# Patient Record
Sex: Female | Born: 1960
Health system: Southern US, Community
[De-identification: ages and names within clinical notes are randomized; demographics above are authoritative.]

## PROBLEM LIST (undated history)

## (undated) DIAGNOSIS — M069 Rheumatoid arthritis, unspecified: Secondary | ICD-10-CM

## (undated) DIAGNOSIS — I10 Essential (primary) hypertension: Secondary | ICD-10-CM

## (undated) DIAGNOSIS — Z8619 Personal history of other infectious and parasitic diseases: Secondary | ICD-10-CM

## (undated) HISTORY — DX: Rheumatoid arthritis, unspecified: M06.9

## (undated) HISTORY — DX: Personal history of other infectious and parasitic diseases: Z86.19

## (undated) HISTORY — DX: Essential (primary) hypertension: I10

---

## 2004-04-28 ENCOUNTER — Encounter: Admission: RE | Admit: 2004-04-28 | Discharge: 2004-04-28 | Payer: Self-pay | Admitting: *Deleted

## 2004-07-06 ENCOUNTER — Ambulatory Visit (HOSPITAL_COMMUNITY): Admission: RE | Admit: 2004-07-06 | Discharge: 2004-07-06 | Payer: Self-pay | Admitting: Obstetrics & Gynecology

## 2010-05-03 DIAGNOSIS — I10 Essential (primary) hypertension: Secondary | ICD-10-CM | POA: Insufficient documentation

## 2010-05-03 HISTORY — DX: Essential (primary) hypertension: I10

## 2013-06-27 ENCOUNTER — Emergency Department (INDEPENDENT_AMBULATORY_CARE_PROVIDER_SITE_OTHER)
Admission: EM | Admit: 2013-06-27 | Discharge: 2013-06-27 | Disposition: A | Discharge status: Home / Self Care | Payer: Self-pay | Source: Home / Self Care | Attending: Family Medicine | Admitting: Family Medicine

## 2013-06-27 ENCOUNTER — Telehealth (HOSPITAL_COMMUNITY): Payer: Self-pay | Admitting: Family Medicine

## 2013-06-27 ENCOUNTER — Encounter (HOSPITAL_COMMUNITY): Payer: Self-pay | Admitting: Emergency Medicine

## 2013-06-27 DIAGNOSIS — M79606 Pain in leg, unspecified: Secondary | ICD-10-CM

## 2013-06-27 DIAGNOSIS — M79609 Pain in unspecified limb: Secondary | ICD-10-CM

## 2013-06-27 LAB — BASIC METABOLIC PANEL
BUN: 9 mg/dL (ref 6–23)
CO2: 25 mEq/L (ref 19–32)
Calcium: 9.3 mg/dL (ref 8.4–10.5)
Chloride: 100 mEq/L (ref 96–112)
Creatinine, Ser: 0.49 mg/dL — ABNORMAL LOW (ref 0.50–1.10)
GFR calc Af Amer: >90 mL/min (ref >90)
GFR calc non Af Amer: >90 mL/min (ref >90)
Glucose, Bld: 88 mg/dL (ref 70–99)
Potassium: 4.1 mEq/L (ref 3.7–5.3)
Sodium: 138 mEq/L (ref 137–147)

## 2013-06-27 LAB — SEDIMENTATION RATE: Sed Rate: 62 mm/hr — ABNORMAL HIGH (ref 0–22)

## 2013-06-27 LAB — CK: Total CK: 33 U/L (ref 7–177)

## 2013-06-27 LAB — CBC
HCT: 36 % (ref 36.0–46.0)
Hemoglobin: 12.4 g/dL (ref 12.0–15.0)
MCH: 28.1 pg (ref 26.0–34.0)
MCHC: 34.4 g/dL (ref 30.0–36.0)
MCV: 81.4 fL (ref 78.0–100.0)
Platelets: 359 10*3/uL (ref 150–400)
RBC: 4.42 MIL/uL (ref 3.87–5.11)
RDW: 13.7 % (ref 11.5–15.5)
WBC: 12.8 10*3/uL — ABNORMAL HIGH (ref 4.0–10.5)

## 2013-06-27 MED ORDER — TRAMADOL HCL 50 MG PO TABS: 50.0000 mg | ORAL_TABLET | Freq: Four times a day (QID) | ORAL | Status: DC | PRN

## 2013-06-27 NOTE — Discharge Instructions (Signed)
Thank you for coming in today. Please call [phone] and asked to be transferred to the vascular ultrasound to get your ultrasound appointment changed. Otherwise please show up at 11:45 in the morning on Friday at the [facility] registration for your ultrasound. Use tramadol for severe pain Go to the emergency room if your pain history medically worse Myalgia, Adult Myalgia is the medical term for muscle pain. It is a symptom of many things. Nearly everyone at some time in their life has this. The most common cause for muscle pain is overuse or straining and more so when you are not in shape. Injuries and muscle bruises cause myalgias. Muscle pain without a history of injury can also be caused by a virus. It frequently comes along with the flu. Myalgia not caused by muscle strain can be present in a large number of infectious diseases. Some autoimmune diseases like lupus and fibromyalgia can cause muscle pain. Myalgia may be mild, or severe. SYMPTOMS  The symptoms of myalgia are simply muscle pain. Most of the time this is short lived and the pain goes away without treatment. DIAGNOSIS  Myalgia is diagnosed by your caregiver by taking your history. This means you tell him when the problems began, what they are, and what has been happening. If this has not been a long term problem, your caregiver may want to watch for a while to see what will happen. If it has been long term, they may want to do additional testing. TREATMENT  The treatment depends on what the underlying cause of the muscle pain is. Often anti-inflammatory medications will help. HOME CARE INSTRUCTIONS  If the pain in your muscles came from overuse, slow down your activities until the problems go away.  Myalgia from overuse of a muscle can be treated with alternating hot and cold packs on the muscle affected or with cold for the first couple days. If either heat or cold seems to make things worse, stop their use.  Apply ice to the  sore area for 15-20 minutes, 03-04 times per day, while awake for the first 2 days of muscle soreness, or as directed. Put the ice in a plastic bag and place a towel between the bag of ice and your skin.  Only take over-the-counter or prescription medicines for pain, discomfort, or fever as directed by your caregiver.  Regular gentle exercise may help if you are not active.  Stretching before strenuous exercise can help lower the risk of myalgia. It is normal when beginning an exercise regimen to feel some muscle pain after exercising. Muscles that have not been used frequently will be sore at first. If the pain is extreme, this may mean injury to a muscle. SEEK MEDICAL CARE IF:  You have an increase in muscle pain that is not relieved with medication.  You begin to run a temperature.  You develop nausea and vomiting.  You develop a stiff and painful neck.  You develop a rash.  You develop muscle pain after a tick bite.  You have continued muscle pain while working out even after you are in good condition. SEEK IMMEDIATE MEDICAL CARE IF: Any of your problems are getting worse and medications are not helping. MAKE SURE YOU:   Understand these instructions.  Will watch your condition.  Will get help right away if you are not doing well or get worse. Document Released: 03/11/2006 Document Revised: 07/12/2011 Document Reviewed: 05/31/2006 [facility] Patient Information 2014 [place], [place].

## 2013-06-27 NOTE — ED Notes (Signed)
C/o bilateral leg pain for six months States she has pain, swelling Has tried change blood pressure medication to help with muscle ache Has tried stretching and aleve

## 2013-06-27 NOTE — ED Provider Notes (Signed)
Vicki Cook is a 53 y.o. female who presents to Urgent Care today for bilateral right worse than left leg pain. This is been present for the past 6 months it occurs off and on. It has been worse again for the past week or 2. She denies any fevers or chills nausea vomiting or diarrhea. The pain is worse with prolonged standing and walking. She attributes the pain to blood pressure medications. She currently is on Toprol. She's tried some over-the-counter medications which have not helped. She has been do her regular doctor several times but she has not had a formal workup yet. No radiating pain weakness or numbness   History reviewed. No pertinent past medical history. History  Substance Use Topics  . Smoking status: Not on file  . Smokeless tobacco: Not on file  . Alcohol Use: Not on file   ROS as above Medications: No current facility-administered medications for this encounter.   Current Outpatient Prescriptions  Medication Sig Dispense Refill  . traMADol (ULTRAM) 50 MG tablet Take 1 tablet (50 mg total) by mouth every 6 (six) hours as needed.  15 tablet  0    Exam:  BP 146/83  Pulse 95  Temp(Src) 98.2 F (36.8 C) (Oral)  Resp 15  SpO2 100%  LMP 06/17/2013 Gen: Well NAD HEENT: EOMI,  MMM Lungs: Normal work of breathing. CTABL Heart: RRR no MRG Abd: NABS, Soft. NT, ND Exts: Brisk capillary refill, warm and well perfused.  Bilateral legs are relatively normal appearing with no edema. Bilateral calves are very mildly tender. Normal motion capillary refill and sensation. Pulses are intact bilateral dorsal pedis is slightly reduced bilateral PTs. Capillary refill and sensation are intact distally.   Results for orders placed during the hospital encounter of 06/27/13 (from the past 24 hour(s))  CBC     Status: Abnormal   Collection Time    06/27/13  4:58 PM      Result Value Ref Range   WBC 12.8 (*) 4.0 - 10.5 K/uL   RBC 4.42  3.87 - 5.11 MIL/uL   Hemoglobin 12.4  12.0  - 15.0 g/dL   HCT 36.0  36.0 - 46.0 %   MCV 81.4  78.0 - 100.0 fL   MCH 28.1  26.0 - 34.0 pg   MCHC 34.4  30.0 - 36.0 g/dL   RDW 13.7  11.5 - 15.5 %   Platelets 359  150 - 400 K/uL   No results found.  Assessment and Plan: 53 y.o. female with leg pain. Unclear etiology. This is a chronic issue however I feel that deserves a bit of workup. Work up will include CBC, BMP, CK, ESR. Additionally will obtain ultrasounds of both arteries and venous systems of both legs on Friday. She will present to the emergency room sooner if worsening. Tramadol for pain control.  Discussed warning signs or symptoms. Please see discharge instructions. Patient expresses understanding.    Gregor Hams, MD 06/27/13 [phone]

## 2013-06-27 NOTE — ED Notes (Signed)
Lab results have come back.  Patient has elevated WBC and ESR. I recommended patient go to the emergency room for further evaluation and management.  She states that she would like to wait until a PCP appointment on Friday.  I  reemphasized that this may be a serious issue. She does not want to the emergency room. She expresses understanding and agreement.   Gregor Hams, MD 06/27/13 [phone]

## 2013-06-29 ENCOUNTER — Other Ambulatory Visit (HOSPITAL_COMMUNITY): Payer: Self-pay | Admitting: Family Medicine

## 2013-06-29 ENCOUNTER — Ambulatory Visit (HOSPITAL_COMMUNITY)
Admission: RE | Admit: 2013-06-29 | Discharge: 2013-06-29 | Disposition: A | Discharge status: Home / Self Care | Payer: Self-pay | Source: Ambulatory Visit | Attending: Family Medicine | Admitting: Family Medicine

## 2013-06-29 ENCOUNTER — Ambulatory Visit (HOSPITAL_COMMUNITY): Admit: 2013-06-29 | Discharge: 2013-06-29 | Disposition: A | Discharge status: Home / Self Care | Payer: Self-pay

## 2013-06-29 DIAGNOSIS — M79609 Pain in unspecified limb: Secondary | ICD-10-CM | POA: Insufficient documentation

## 2013-06-29 DIAGNOSIS — M79606 Pain in leg, unspecified: Secondary | ICD-10-CM

## 2013-06-29 NOTE — Progress Notes (Addendum)
VASCULAR LAB PRELIMINARY  PRELIMINARY  PRELIMINARY  PRELIMINARY  Lower extremity venous duplex and ABIs completed.    Preliminary report:  Negative for deep and superficial vein thrombosis bilateral.  Normal ABI,s  Incidental findings of highly vascularized  Structures in the popliteal fossa extending into  Proximal calf.  Etiology unknown.  Jamilette Suchocki, RVT 06/29/2013, 5:15 PM

## 2013-07-02 ENCOUNTER — Telehealth (HOSPITAL_COMMUNITY): Payer: Self-pay | Admitting: Family Medicine

## 2013-07-02 NOTE — ED Notes (Signed)
Abnormal vascular structures BL popliteal fossa.  Unclear etiology.  Discussed with radiology.  Plan for limited extremity ultrasound to evaluate the structure.  [place] will call the patient and schedule.    Rodolph Bong, MD 07/02/13 [phone]

## 2013-07-06 ENCOUNTER — Ambulatory Visit (HOSPITAL_COMMUNITY)
Admission: RE | Admit: 2013-07-06 | Discharge: 2013-07-06 | Disposition: A | Discharge status: Home / Self Care | Payer: Self-pay | Source: Ambulatory Visit | Attending: Family Medicine | Admitting: Family Medicine

## 2013-07-06 DIAGNOSIS — Z1389 Encounter for screening for other disorder: Secondary | ICD-10-CM | POA: Insufficient documentation

## 2013-07-09 NOTE — ED Notes (Signed)
Patient called earlier today, c/o she is out of her medication, and cannot bear the pain any more. C/o she has not heard back from the MD regarding her reports, and what is she to do now ? spoke w Spoke w Dr Carin Primrose after review of record,  authorized refill of ultram 50 mg po, 1 tablet q 6 hours prn pain x 15, NR . Called in to [facility] at pt request, spoke directly w pharmacist. Patient was advised Dr Denyse Amass will have report on his desk for him to review on his return to clinic in AM , but pt should expect to return to clinic for further evaluation if there are other issues , no further refills will be authorized w/o exam

## 2013-07-11 ENCOUNTER — Telehealth (HOSPITAL_COMMUNITY): Payer: Self-pay | Admitting: Family Medicine

## 2013-07-11 NOTE — ED Notes (Signed)
UPDATE: Patient was seen at [facility] for leg pain.  She had a vascular scan which showed no DVT or arterial issues but a structure in the posterior knee.  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  I recommend f/u with the [place] [facility] sports [facility].   Patient expressed understanding and agreement.    Rodolph Bong, MD 07/11/13 [phone]

## 2013-07-11 NOTE — ED Notes (Signed)
Patient called w many questions regarding her wait for new appt w sports medicine. Per Dr Denyse Amass, she needs to ocme in to have an evaluation and poss intervention for pain

## 2013-07-16 ENCOUNTER — Ambulatory Visit (INDEPENDENT_AMBULATORY_CARE_PROVIDER_SITE_OTHER): Payer: Self-pay | Admitting: Family Medicine

## 2013-07-16 ENCOUNTER — Encounter: Payer: Self-pay | Admitting: Family Medicine

## 2013-07-16 VITALS — BP 112/79 | Ht 60.0 in | Wt 153.0 lb

## 2013-07-16 DIAGNOSIS — M7120 Synovial cyst of popliteal space [Baker], unspecified knee: Secondary | ICD-10-CM

## 2013-07-16 MED ORDER — TRAMADOL HCL 50 MG PO TABS: 25.0000 mg | ORAL_TABLET | Freq: Four times a day (QID) | ORAL | Status: DC | PRN

## 2013-07-16 NOTE — Patient Instructions (Signed)
You do have a Baker's cyst but there is a lot of blood flow around it so we do not think it is safe to inject steroids at this time into the area. We need to figure out why you have this excess blood flow by getting an MRI of the area.  We need to get you the orange card to help pay for this MRI.  Once you have the orange card insurance in hand, call this office and we will schedule your MRI.  After you have your MRI, follow up with [place] about a week afterwards.   Thanks, Dr. Durene Cal and Dr. Jennette Kettle

## 2013-07-17 ENCOUNTER — Ambulatory Visit: Payer: Self-pay

## 2013-07-20 DIAGNOSIS — M7120 Synovial cyst of popliteal space [Baker], unspecified knee: Secondary | ICD-10-CM | POA: Insufficient documentation

## 2013-07-20 NOTE — Progress Notes (Signed)
   Subjective:    Patient ID: Vicki Cook, female    DOB: [birth year], 53 y.o.   MRN: [id number]  HPI   Here for evaluation of bilateral but right greater than left knee pain. Pain is primarily located in the posterior portion of the knee. It's been going on 6-8 months. No specific injury. Pain is worse with standing long periods of time. Pain is localized to the knee. Does not radiate. Has had no locking, buckling or falling.  PERTINENT  PMH / PSH: No history of prior knee injury or surgery. Patient has no personal history of diabetes mellitus. Review of Systems Denies numbness in her legs or feet. She's had no erythema warmth or swelling of her knees. Denies fever, sweats, chills, unusual weight change.    Objective:   Physical Exam Vital signs are reviewed GENERAL: Well-developed female no acute distress KNEES: Bilaterally she has full range of motion in flexion and extension. She has some diffuse tenderness in the popliteal space particularly on the right. Both popliteal space areas are full consistent with Baker's cyst. The calves bilaterally are soft. Distally she is intact neurovascularly. There is no tenderness along the medial or lateral joint lines. She is ligamentously intact to varus and valgus stress and has a normal walk on bilaterally. ULTRASOUND: Complex apparent Baker's cyst on the right with an extreme amount of vascularity noted surrounding the cyst. There is some debris floating within the cyst. The right cystic compasses majority of the popliteal space. On the left, the systems much more typical, less complex, there is still an increased amount of vascularity from what I usually see in Baker's cyst the       Assessment & Plan:  #1. Bilateral Baker cysts. These are unusual in my estimation because of the rather large amount of vascularity associated with him. I reviewed her Doppler studies from the vascular lab and they also commented on increased vascularity seen in  the popliteal space. It does not look like popliteal aneurysm.  We discussed this with patient at length. I think the next step we need to do would be an MRI of the right knee. She has no insurance coverage so we're going to try get her into process for sign up for the [facility] card which would at least give her imaging coverage. Until then, I don't think I would aspirate, inject or otherwise do procedure on the knees. Gave her information to get set up with the [facility] card process; she supposed to followup with [place] as soon as possible after that. 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

## 2013-07-23 NOTE — Addendum Note (Signed)
Addended by: Annita Brod on: 07/23/2013 04:58 PM   Modules accepted: Orders

## 2013-07-25 ENCOUNTER — Telehealth: Payer: Self-pay | Admitting: *Deleted

## 2013-07-25 NOTE — Telephone Encounter (Signed)
Pt has orange card now, would like to be scheduled for MRI sooner than 08/08/13.  Called  imaging and rescheduled MRI for 07/27/13 at 6:30 am.  Pt notified of appt change.

## 2013-07-27 ENCOUNTER — Ambulatory Visit
Admission: RE | Admit: 2013-07-27 | Discharge: 2013-07-27 | Disposition: A | Discharge status: Home / Self Care | Payer: No Typology Code available for payment source | Source: Ambulatory Visit | Attending: Family Medicine | Admitting: Family Medicine

## 2013-07-27 DIAGNOSIS — M7120 Synovial cyst of popliteal space [Baker], unspecified knee: Secondary | ICD-10-CM

## 2013-07-30 ENCOUNTER — Encounter: Payer: Self-pay | Admitting: Family Medicine

## 2013-07-30 ENCOUNTER — Ambulatory Visit (INDEPENDENT_AMBULATORY_CARE_PROVIDER_SITE_OTHER): Payer: No Typology Code available for payment source | Admitting: Family Medicine

## 2013-07-30 VITALS — BP 140/99 | HR 85 | Ht 60.0 in | Wt 153.0 lb

## 2013-07-30 DIAGNOSIS — M25562 Pain in left knee: Principal | ICD-10-CM

## 2013-07-30 DIAGNOSIS — M7120 Synovial cyst of popliteal space [Baker], unspecified knee: Secondary | ICD-10-CM

## 2013-07-30 DIAGNOSIS — M25561 Pain in right knee: Secondary | ICD-10-CM

## 2013-07-30 DIAGNOSIS — M25569 Pain in unspecified knee: Secondary | ICD-10-CM

## 2013-07-30 NOTE — Assessment & Plan Note (Signed)
Left knee was injected today. Will plan to see her back in 2-3 weeks to see if she has any benefit from this injection. If she does, it is reasonable to drain and inject the R bakers cyst as well. If she does not have benefit, will likely avoid injecting R knee.   INJECTION: Patient was given informed consent, signed copy in the chart. Appropriate time out was taken. Area prepped and draped in usual sterile fashion. 1 cc of methylprednisolone 40 mg/ml plus  ___ cc of 1% lidocaine without epinephrine was injected into the left knee using a medial approach. The patient tolerated the procedure well. There were no complications. Post procedure instructions were given.

## 2013-07-30 NOTE — Progress Notes (Signed)
   HPI:  F/u MRI results: here to discuss the results of her recent R knee MRI. She endorses significant pain in both of her knees. Today the pain is worse on the L. MRI of R knee showed bakers cyst. Has pain especially with walking.  ROS: See HPI  PMFSH: bakers cyst of R knee  PHYSICAL EXAM: BP 140/99  Pulse 85  Ht 5' (1.524 m)  Wt 153 lb (69.4 kg)  BMI 29.88 kg/m2 Gen: NAD, pleasant, coopeartive MSK: bilateral knees without effusion, erythema, deformity. MCL and LCL intact bilaterally. Full strength with flexion & extension bilaterally.  ASSESSMENT/PLAN:  See problem based charting for assessment/plan.  FOLLOW UP: F/u in 2-3 weeks for knee pain  SIGNED: [place] J. Pollie Meyer, MD Family Medicine Resident PGY-2 [facility]

## 2013-08-01 NOTE — Progress Notes (Signed)
Followup bilateral knee pain. At last office visit the right knee was bothering him more. Today the left knee is bothering her more. She did have MRI of her right knee done. She wants to discuss those findings and options theVital signs are reviewed GENERAL: Well-developed female no acute distress KNEES: Bilaterally she has fullness in the popliteal space, right greater than left. The calf is soft bilaterally. She has full range of flexion extension on both knees. Distally she is neurovascularly intact. IMAGING: MRI reveals a complex Baker's cyst in the right knee.  As today her left knee is hurting worse will do a corticosteroid injection there. I probably will see how she responds to injection therapy before undertaking aspiration injection on the right knee. The right knee would have to be done her ultrasound to avoid devascularized area in the popliteal space.

## 2013-08-01 NOTE — Assessment & Plan Note (Signed)
1. Complex moderate-sized joint effusion and Baker's cyst as  described, the latter likely partially ruptured.  2. Associated medial bursal fluid and MCL degeneration may  contribute to medial knee pain. The medial collateral ligament is  intact.  3. Relatively mild tricompartmental degenerative changes. No acute  osteochondral findings.  4. Intact menisci and cruciate ligaments.

## 2013-08-08 ENCOUNTER — Ambulatory Visit (HOSPITAL_COMMUNITY): Payer: No Typology Code available for payment source

## 2013-08-10 ENCOUNTER — Ambulatory Visit (HOSPITAL_COMMUNITY): Payer: No Typology Code available for payment source

## 2013-08-20 ENCOUNTER — Ambulatory Visit: Payer: No Typology Code available for payment source | Admitting: Family Medicine

## 2013-08-27 ENCOUNTER — Encounter: Payer: Self-pay | Admitting: Family Medicine

## 2013-08-27 ENCOUNTER — Ambulatory Visit (INDEPENDENT_AMBULATORY_CARE_PROVIDER_SITE_OTHER): Payer: No Typology Code available for payment source | Admitting: Family Medicine

## 2013-08-27 VITALS — BP 127/89 | Ht 60.0 in | Wt 153.0 lb

## 2013-08-27 DIAGNOSIS — M25561 Pain in right knee: Secondary | ICD-10-CM

## 2013-08-27 DIAGNOSIS — M25562 Pain in left knee: Secondary | ICD-10-CM

## 2013-08-27 DIAGNOSIS — M25569 Pain in unspecified knee: Secondary | ICD-10-CM

## 2013-08-27 DIAGNOSIS — M7120 Synovial cyst of popliteal space [Baker], unspecified knee: Secondary | ICD-10-CM

## 2013-08-27 MED ORDER — METHYLPREDNISOLONE ACETATE 40 MG/ML IJ SUSP
40.0000 mg | Freq: Once | INTRAMUSCULAR | Status: DC
  Administered 2013-08-27: 40 mg via INTRA_ARTICULAR

## 2013-08-27 MED ORDER — METHYLPREDNISOLONE ACETATE 40 MG/ML IJ SUSP
40.0000 mg | Freq: Once | INTRAMUSCULAR | Status: AC
  Administered 2013-08-27: 40 mg via INTRA_ARTICULAR

## 2013-08-27 NOTE — Assessment & Plan Note (Addendum)
Ultrasound-guided aspiration with injection 1 cc of corticosteroid today. Into the right knee.

## 2013-08-27 NOTE — Progress Notes (Signed)
   Subjective:    Patient ID: Vicki Cook, female    DOB: 1960/09/15, 53 y.o.   MRN: [id number]  HPI Followup left knee pain. I have given her a corticosteroid injection last office visit she is about 90% better with the left knee. Still has occasional swelling if she works 12 hour shifts but usually by the next day the swelling is gone. Pain significantly better. At the end of the day she can take 2 Tylenol and rest well with respect to the left knee. The right knee continues to be problematic. He had an MRI that which showed large Baker's cyst. He upset revealed a lot of excess vascularity. She's back today for evaluation of possible aspiration the Baker's cyst, possible injection of the knee. She's here with her son.   Review of Systems No fever, sweats, chills, unusual weight change.    Objective:   Physical Exam Vital signs are reviewed GENERAL: Well-developed female no acute distress KNEE: Left. Full range of motion in flexion extension. No effusion. Very slight medial joint line tenderness to palpation. KNEE: Right. Very full popliteal space which is mildly tender. There is no erythema or warmth. No significant knee effusion. She has a lot of crepitus. Medial joint line tenderness to palpation.  PROCEDURE NOTE: Patient given informed consent, signed copy in the chart. Appropriate time out was taken. The right popliteal space was cleaned and prepped in usual sterile fashion. The ultrasound was used as a guide for local anesthesia with 4 cc of 1% lidocaine without epinephrine. After anesthesia was obtained, the ultrasound was used to guide the 18-gauge needle on a 60 cc syringe into the Baker's cyst avoiding any major blood vessels. The tip of the needle could easily be seen inside the cyst and suction was obtained but minimal material was aspirated. A lot of debris in the cyst could be seen to move. with aspiration so I'm pretty sure we were in the right place. Only 2 cc of clear viscous  material were removed. The 60 cc syringe was then removed using sterile hemostats and replaced with a 3 cc syringe holding 1 cc of lidocaine 1% without epinephrine and 1 cc of methylprednisolone 40 mg per ml. This was injected into the Baker's cyst. All equipment was removed and a Band-Aid applied. Patient to our procedure well. Patient given red flags to watch out for such as signs of infection, it significant increase in pain, swelling, erythema, fever.       Assessment & Plan:

## 2013-11-06 ENCOUNTER — Ambulatory Visit (INDEPENDENT_AMBULATORY_CARE_PROVIDER_SITE_OTHER): Payer: No Typology Code available for payment source | Admitting: Family Medicine

## 2013-11-06 ENCOUNTER — Encounter: Payer: Self-pay | Admitting: Family Medicine

## 2013-11-06 VITALS — BP 144/93 | HR 87 | Ht 60.0 in | Wt 153.0 lb

## 2013-11-06 DIAGNOSIS — M25562 Pain in left knee: Secondary | ICD-10-CM

## 2013-11-06 DIAGNOSIS — M25569 Pain in unspecified knee: Secondary | ICD-10-CM

## 2013-11-06 MED ORDER — METHYLPREDNISOLONE ACETATE 40 MG/ML IJ SUSP
40.0000 mg | Freq: Once | INTRAMUSCULAR | Status: AC
  Administered 2013-11-06: 40 mg via INTRA_ARTICULAR

## 2013-11-06 NOTE — Assessment & Plan Note (Signed)
posterior and consistent with known large baker's cyst.  Has not had an MRI on this knee but I suspect it would be nearly identical to right knee MRI.  No DVT.  I do not think it's worthwhile to try aspiration and injection of this knee - would likely be similar to that tried on right knee and only elicit 1-2 mL of cystic fluid, not worth the risk.  She did have benefit with injection - advised we repeat this today into left knee under ultrasound guidance.  Icing, compression, elevation.  Consider referral to ortho surgery though difficult with [facility] coverage.  After informed written consent, patient was lying supine on exam table. Left knee was prepped with alcohol swab and utilizing superolateral approach under ultrasound guidance, patient's left knee was injected intraarticularly with 3:1 marcaine: depomedrol. Patient tolerated the procedure well without immediate complications.

## 2013-11-06 NOTE — Progress Notes (Signed)
Patient ID: TYERRA LORETTO, female   DOB: [birth year], 53 y.o.   MRN: [id number]  PCP: No PCP Per Patient  Subjective:   HPI: Patient is a 53 y.o. female here for left knee pain.  Patient has been seen previously in [place] office for issues with bilateral leg and knee swelling. She's had this for over 8 months now. No known injury. Causes pain, swelling, tightness in backs of knees into calf muscles. 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  No meniscal tears, other pathology of note. She had intraarticular injection of left knee which helped but was painful for first 2 weeks - pain has worsened over past month. Tried to have right bakers cyst aspirated and injection - only 1-2 mL thick cystic fluid obtained before injection. Left one is worse than right currently. Only has [facility] coverage at the moment. Tried ibuprofen 600 three times a day. Worse when on her feet or walking a lot.  Past Medical History  Diagnosis Date  . Hypertension     Current Outpatient Prescriptions on File Prior to Visit  Medication Sig Dispense Refill  . traMADol (ULTRAM) 50 MG tablet Take 0.5-1 tablets (25-50 mg total) by mouth every 6 (six) hours as needed.  30 tablet  0   No current facility-administered medications on file prior to visit.    History reviewed. No pertinent past surgical history.  Allergies  Allergen Reactions  . Asa [Aspirin]     History   Social History  . Marital Status: Married    Spouse Name: N/A    Number of Children: N/A  . Years of Education: N/A   Occupational History  . Not on file.   Social History Main Topics  . Smoking status: Never Smoker   . Smokeless tobacco: Not on file  . Alcohol Use: Not on file  . Drug Use: Not on file  . Sexual Activity: Not on file   Other Topics Concern  . Not on file   Social History Narrative  . No  narrative on file    No family history on file.  BP 144/93  Pulse 87  Ht 5' (1.524 m)  Wt 153 lb (69.4 kg)  BMI 29.88 kg/m2  Review of Systems: See HPI above.    Objective:  Physical Exam:  Gen: NAD  Left knee: Mod effusion and moderate sized bakers cyst posterior knee.  No bruising, other deformity. No TTP currently. ROM 0 - 120 degrees without pain. Negative ant/post drawers. Negative valgus/varus testing. Negative lachmanns. Negative mcmurrays, apleys, patellar apprehension, clarkes. NV intact distally.    Assessment & Plan:  1. Left knee pain - posterior and consistent with known large baker's cyst.  Has not had an MRI on this knee but I suspect it would be nearly identical to right knee MRI.  No DVT.  I do not think it's worthwhile to try aspiration and injection of this knee - would likely be similar to that tried on right knee and only elicit 1-2 mL of cystic fluid, not worth the risk.  She did have benefit with injection - advised we repeat this today into left knee under ultrasound guidance.  Icing, compression, elevation.  Consider referral to ortho surgery though difficult with [facility] coverage.  After informed written consent, patient was lying supine on exam table. Left knee was prepped with alcohol swab and utilizing  superolateral approach under ultrasound guidance, patient's left knee was injected intraarticularly with 3:1 marcaine: depomedrol. Patient tolerated the procedure well without immediate complications.

## 2013-11-06 NOTE — Patient Instructions (Signed)
You do not have very many options that you haven't tried for your baker's cysts. We gave you an injection into the left knee today - these are helpful but will likely not help permanently. Icing 15 minutes at a time. Elevating above the level of your heart. ACE wrap or compression sleeve will help with pain and swelling Ibuprofen or aleve for pain and inflammation. Consider seeing a Careers adviser.

## 2014-01-14 ENCOUNTER — Ambulatory Visit: Payer: No Typology Code available for payment source

## 2014-01-31 ENCOUNTER — Ambulatory Visit: Payer: Self-pay | Admitting: Internal Medicine

## 2014-02-07 ENCOUNTER — Ambulatory Visit (INDEPENDENT_AMBULATORY_CARE_PROVIDER_SITE_OTHER): Payer: Self-pay | Admitting: Internal Medicine

## 2014-02-07 ENCOUNTER — Encounter: Payer: Self-pay | Admitting: Internal Medicine

## 2014-02-07 VITALS — BP 138/96 | HR 67 | Temp 98.2°F | Wt 155.3 lb

## 2014-02-07 DIAGNOSIS — I10 Essential (primary) hypertension: Secondary | ICD-10-CM

## 2014-02-07 DIAGNOSIS — M25531 Pain in right wrist: Secondary | ICD-10-CM

## 2014-02-07 DIAGNOSIS — M659 Synovitis and tenosynovitis, unspecified: Secondary | ICD-10-CM

## 2014-02-07 DIAGNOSIS — M778 Other enthesopathies, not elsewhere classified: Secondary | ICD-10-CM | POA: Insufficient documentation

## 2014-02-07 DIAGNOSIS — IMO0002 Reserved for concepts with insufficient information to code with codable children: Secondary | ICD-10-CM

## 2014-02-07 DIAGNOSIS — B80 Enterobiasis: Secondary | ICD-10-CM

## 2014-02-07 MED ORDER — ALBENDAZOLE 200 MG PO TABS
400.0000 mg | ORAL_TABLET | Freq: Once | ORAL | Status: DC
Start: 2014-02-07 — End: 2014-04-23

## 2014-02-07 NOTE — Assessment & Plan Note (Signed)
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 The patient has no localizing neurological defects on exam and no swelling. Apparently the patient has had a history of some swelling in some joints and there was concern at some point for inflammatory arthritis the patient brings in paperwork that showed labs needed for RF factor and sedimentation rate. Per chart review the patient has had an elevated sedimentation rate but in the setting of current parasitic infection this seems unconnected. -RF factor and sedimentation rate collected - continue physical therapy and ibuprofen for elbow for symptomatic relief, Ace bandage wraps were also recommended when doing hard work for prevention of reinjury -Followup as needed

## 2014-02-07 NOTE — Patient Instructions (Signed)
General Instructions: For your itching we will give you medicine for the parasite. Take one pill today and then 1 pill in 2 weeks.   For your blood pressure we want you to come back in 2 weeks to recheck and then make some changes.   We are doing some labs today to test your kidney and liver and check for diabetes we will call you with the results.   Thank you for bringing your medicines today. This helps [place] keep you safe from mistakes.   Progress Toward Treatment Goals:  No flowsheet data found.  Self Care Goals & Plans:  No flowsheet data found.  No flowsheet data found.   Care Management & Community Referrals:  No flowsheet data found.

## 2014-02-07 NOTE — Assessment & Plan Note (Signed)
BP Readings from Last 3 Encounters:  02/07/14 138/96  11/06/13 144/93  08/27/13 127/89    Lab Results  Component Value Date   NA 138 06/27/2013   K 4.1 06/27/2013   CREATININE 0.49* 06/27/2013    Assessment: Blood pressure control:   Progress toward BP goal:    Comments: Patient was apparently seen at some point at urgent care centers and was given prescription of metoprolol for at least 6-12 months that she has been taking as prescribed.  Plan: Medications:  Patient is currently taking Lopressor 12.5 mg twice a day it is unclear as to why this was the first choice and not a diuretic. At followup visit patient was recommended to start either HCTZ or lisinopril once risk stratification labs are obtained. Educational resources provided:   Self management tools provided:   Other plans: Patient will return in 2 weeks to discuss results and blood pressure recheck. Today CMet, hemoglobin A1c, lipid panel was collected

## 2014-02-07 NOTE — Assessment & Plan Note (Signed)
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 The patient does not have any systemic symptoms such as abdominal pain or nausea vomiting or diarrhea. This is most likely the case as patient reports having previous infection back in her country where she required medications. Given her financial situation may be difficult for the patient to afford first-line therapy. -Albendazole 400 mg x1 and then repeat in 2 weeks -As the patient is unable to afford this Will prescribe pyrantel pamoate -Patient was advised on good hygiene and that other inhabits at the home can become infected if they come in contact with infected sheets or clothing.

## 2014-02-07 NOTE — Progress Notes (Signed)
Subjective:   Patient ID: Vicki Cook female   DOB: 1960-10-19 53 y.o.   MRN: [id number]  HPI: Vicki Cook is a 53 y.o. woman pmh as listed below here as a new pt establishing care with HTN.   Hypertension ROS: taking medications as instructed, no medication side effects noted, patient does not perform home BP monitoring, no TIA's, no chest pain on exertion, no dyspnea on exertion, no swelling of ankles, no orthostatic dizziness or lightheadedness and no palpitations.   The patient recently moved from [place] back in 1999 where she studied long performing her husband and moving to [place]. 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 Her husband was an incarcerated and has been in general for the past 8 years. 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 This is recently become complicated as she has bilateral leg joint pain and popliteal synovial cyst requiring surgery as recommended by sports medicine. Her new complaint today is some right arm pain and soreness mostly in her antecubital region. She has not had any numbness or weakness, shooting pain, joint swelling, or trauma to the area. She does state that at some point she has had some joint swelling mostly in her right wrist that are sometimes accompanied by rashes and she had seen in immigration physician had recommended her get some testing for inflammatory arthritis but she was never able to complete these.  Her other complaint and main concern is some perianal itching that is worse at night. She states that back in July because she had several episodes of these and remembers having to take the medication that removed the itching. She has not seen any worms or rashes and not had any visualization of this area by anyone else. She does not have any abdominal pain, nausea vomiting or diarrhea, hematochezia or  frank red blood per rectum. She has not tried anything over-the-counter and has not had any medication from [place]. She has been routinely changing her sheets and cleaning all of her close and showering daily. She did shower before this visit.  She's a nonsmoker, does no illicit drugs, and uses no alcohol. She has no sexual partners and is not sexually active at this time. Her last Pap smear was 15 years ago that she reports as normal. She does continue to have normal periods. Her mother passed away from heart troubles she does say that she had blood pressure issues. Her father still living and has diabetes and hypertension. Her 2 sons have good health.   Past Medical History  Diagnosis Date  . Hypertension    Current Outpatient Prescriptions  Medication Sig Dispense Refill  . glucosamine-chondroitin 500-400 MG tablet Take 1 tablet by mouth 3 (three) times daily.      . metoprolol tartrate (LOPRESSOR) 12.5 mg TABS tablet Take 25 mg by mouth 2 (two) times daily.      . traMADol (ULTRAM) 50 MG tablet Take 0.5-1 tablets (25-50 mg total) by mouth every 6 (six) hours as needed.  30 tablet  0   No current facility-administered medications for this visit.   No family history on file. History   Social History  . Marital Status: Married    Spouse Name: N/A    Number of Children: N/A  . Years of Education: N/A   Social History Main Topics  . Smoking status:  Never Smoker   . Smokeless tobacco: Not on file  . Alcohol Use: Not on file  . Drug Use: Not on file  . Sexual Activity: Not on file   Other Topics Concern  . Not on file   Social History Narrative  . No narrative on file   Review of Systems: otherwise negative unless listed in HPI  Objective:  Physical Exam: Filed Vitals:   02/07/14 0919  BP: 138/96  Pulse: 67  Temp: 98.2 F (36.8 C)  TempSrc: Oral  Weight: 155 lb 4.8 oz (70.444 kg)  SpO2: 100%   General: sitting in chair, NAD  HEENT: PERRL, EOMI, no scleral icterus,  no LAD Cardiac: RRR, no rubs, murmurs or gallops Pulm: clear to auscultation bilaterally, no crackles, wheezes, or rhonchi, moving normal volumes of air Abd: soft, nontender, nondistended, BS present Ext: warm and well perfused, no pedal edema. No PIP, DIPs, MP joint swelling, no rashes, bilateral knees are wrapped in Ace bandages patient reluctant to remove to complete exam. GU: rectal exam w/o erythema or rashes, no hemorrhoids, no surrounding stool, no other lesions, scothch tape test performed Neuro: alert and oriented X3, cranial nerves II-XII grossly intact, DTRs 2+ bilaterally, 5 out of 5 strength bilaterally both upper extremity and lower extremity, full range of motion of elbow and wrist, tandem gait and coordination normal, normal gait.  Assessment & Plan:  Please see problem oriented charting  Pt discussed with Dr. Lynnae January

## 2014-02-08 LAB — RHEUMATOID FACTOR: Rheumatoid fact SerPl-aCnc: 46 IU/mL — ABNORMAL HIGH (ref <=14)

## 2014-02-08 LAB — COMPREHENSIVE METABOLIC PANEL
ALT: 15 U/L (ref 0–35)
AST: 15 U/L (ref 0–37)
Albumin: 3.5 g/dL (ref 3.5–5.2)
Alkaline Phosphatase: 79 U/L (ref 39–117)
BUN: 10 mg/dL (ref 6–23)
CO2: 28 mEq/L (ref 19–32)
Calcium: 9.4 mg/dL (ref 8.4–10.5)
Chloride: 102 mEq/L (ref 96–112)
Creat: 0.5 mg/dL (ref 0.50–1.10)
Glucose, Bld: 87 mg/dL (ref 70–99)
Potassium: 4.1 mEq/L (ref 3.5–5.3)
Sodium: 138 mEq/L (ref 135–145)
Total Bilirubin: 0.3 mg/dL (ref 0.2–1.2)
Total Protein: 7.3 g/dL (ref 6.0–8.3)

## 2014-02-08 LAB — CBC WITH DIFFERENTIAL/PLATELET
Basophils Absolute: 0 10*3/uL (ref 0.0–0.1)
Basophils Relative: 0 % (ref 0–1)
Eosinophils Absolute: 0.2 10*3/uL (ref 0.0–0.7)
Eosinophils Relative: 2 % (ref 0–5)
HCT: 34.1 % — ABNORMAL LOW (ref 36.0–46.0)
Hemoglobin: 11.1 g/dL — ABNORMAL LOW (ref 12.0–15.0)
Lymphocytes Relative: 16 % (ref 12–46)
Lymphs Abs: 1.9 10*3/uL (ref 0.7–4.0)
MCH: 26.1 pg (ref 26.0–34.0)
MCHC: 32.6 g/dL (ref 30.0–36.0)
MCV: 80.2 fL (ref 78.0–100.0)
Monocytes Absolute: 0.8 10*3/uL (ref 0.1–1.0)
Monocytes Relative: 7 % (ref 3–12)
Neutro Abs: 8.9 10*3/uL — ABNORMAL HIGH (ref 1.7–7.7)
Neutrophils Relative %: 75 % (ref 43–77)
Platelets: 380 10*3/uL (ref 150–400)
RBC: 4.25 MIL/uL (ref 3.87–5.11)
RDW: 15 % (ref 11.5–15.5)
WBC: 11.8 10*3/uL — ABNORMAL HIGH (ref 4.0–10.5)

## 2014-02-08 LAB — LIPID PANEL
Cholesterol: 122 mg/dL (ref 0–200)
HDL: 40 mg/dL (ref >39)
LDL Cholesterol: 57 mg/dL (ref 0–99)
Total CHOL/HDL Ratio: 3.1 Ratio
Triglycerides: 124 mg/dL (ref <150)
VLDL: 25 mg/dL (ref 0–40)

## 2014-02-08 LAB — HEMOGLOBIN A1C
Hgb A1c MFr Bld: 6.2 % — ABNORMAL HIGH (ref <5.7)
Mean Plasma Glucose: 131 mg/dL — ABNORMAL HIGH (ref <117)

## 2014-02-08 LAB — SEDIMENTATION RATE: Sed Rate: 55 mm/hr — ABNORMAL HIGH (ref 0–22)

## 2014-02-11 ENCOUNTER — Telehealth: Payer: Self-pay | Admitting: *Deleted

## 2014-02-11 NOTE — Telephone Encounter (Signed)
Call from pt - Albenza too expensive; $472.81 for 2 tablets w/o insurance. Can u order something else? Thanks

## 2014-02-11 NOTE — Progress Notes (Signed)
Internal Medicine Clinic Attending  Case discussed with Dr. Sadek soon after the resident saw the patient.  We reviewed the resident's history and exam and pertinent patient test results.  I agree with the assessment, diagnosis, and plan of care documented in the resident's note. 

## 2014-02-13 MED ORDER — PYRANTEL PAMOATE 180 MG PO TABS: 180.0000 mg | ORAL_TABLET | Freq: Once | ORAL | Status: DC

## 2014-02-13 NOTE — Telephone Encounter (Signed)
Pt called/informed of new medication.

## 2014-02-13 NOTE — Telephone Encounter (Signed)
Have placed a new order for another medication should be less expensive and available at pharmacy. Please let patient know. Greatly appreciate it.

## 2014-02-25 ENCOUNTER — Ambulatory Visit (INDEPENDENT_AMBULATORY_CARE_PROVIDER_SITE_OTHER): Payer: Self-pay | Admitting: Internal Medicine

## 2014-02-25 ENCOUNTER — Encounter: Payer: Self-pay | Admitting: Internal Medicine

## 2014-02-25 VITALS — BP 140/90 | HR 70 | Temp 97.9°F | Ht 60.0 in | Wt 155.4 lb

## 2014-02-25 DIAGNOSIS — Z0000 Encounter for general adult medical examination without abnormal findings: Secondary | ICD-10-CM | POA: Insufficient documentation

## 2014-02-25 DIAGNOSIS — M7120 Synovial cyst of popliteal space [Baker], unspecified knee: Secondary | ICD-10-CM

## 2014-02-25 DIAGNOSIS — Z23 Encounter for immunization: Secondary | ICD-10-CM

## 2014-02-25 DIAGNOSIS — I10 Essential (primary) hypertension: Secondary | ICD-10-CM

## 2014-02-25 MED ORDER — LISINOPRIL 10 MG PO TABS: 10.0000 mg | ORAL_TABLET | Freq: Every day | ORAL | Status: DC

## 2014-02-25 NOTE — Progress Notes (Signed)
   Subjective:   Patient ID: Vicki Cook female   DOB: 1960/05/20 53 y.o.   MRN: [id number]  HPI: [name] is a 53 y.o. female with HTN presenting to opc today for BP follow up.   HTN--remains hypertensive today, initial BP 150/90, recheck 150/80, on manual repeat 140/90. She is taking lopressor 25mg  bid. Mild headache today. Has been on HCTZ before but not tolerated well.   Of note, she endorses being stressed lately with her husband being incarcerated and she is running the household with limited resources and time.   Past Medical History  Diagnosis Date  . Hypertension    Current Outpatient Prescriptions  Medication Sig Dispense Refill  . albendazole (ALBENZA) 200 MG tablet Take 2 tablets (400 mg total) by mouth once. In 2 weeks on 02/21/14 take the other 2 pills  4 tablet  0  . glucosamine-chondroitin 500-400 MG tablet Take 1 tablet by mouth 3 (three) times daily.      02/23/14 lisinopril (PRINIVIL,ZESTRIL) 10 MG tablet Take 1 tablet (10 mg total) by mouth daily.  30 tablet  1  . Pyrantel Pamoate 180 MG TABS Take 1 tablet (180 mg total) by mouth once.  1 tablet  0  . traMADol (ULTRAM) 50 MG tablet Take 0.5-1 tablets (25-50 mg total) by mouth every 6 (six) hours as needed.  30 tablet  0   No current facility-administered medications for this visit.   Family History  Problem Relation Age of Onset  . Heart disease Mother   . Hyperlipidemia Mother   . Hypertension Mother   . Heart disease Father   . Hypertension Father   . Diabetes Father    History   Social History  . Marital Status: Married    Spouse Name: N/A    Number of Children: N/A  . Years of Education: N/A   Social History Main Topics  . Smoking status: Never Smoker   . Smokeless tobacco: None  . Alcohol Use: No  . Drug Use: No  . Sexual Activity: Not Currently   Other Topics Concern  . None   Social History Narrative  . None   Review of Systems:  Constitutional:  Denies fever, chills  HEENT:   Denies congestion  Respiratory:  Denies SOB  Cardiovascular:  Denies chest pain  Gastrointestinal:  Denies nausea, vomiting, abdominal pain  Genitourinary:  Denies dysuria  Musculoskeletal:  Lower extremity pain  Skin:  Denies pallor, rash and wound.   Neurological:  Mild headache today   Objective:  Physical Exam: Filed Vitals:   02/25/14 1627 02/25/14 1657  BP: 150/90 140/90  Pulse: 70   Temp: 97.9 F (36.6 C)   TempSrc: Oral   Height: 5' (1.524 m)   Weight: 155 lb 6.4 oz (70.489 kg)   SpO2: 100%    Vitals reviewed. General: sitting in chair, NAD HEENT: EOMI Cardiac: RRR Pulm: clear to auscultation bilaterally, no wheezes, rales, or rhonchi Abd: soft, nontender, BS present Ext: moving all 4 extremities, -edema or tenderness to palpation but wearing very tight jeans Neuro: alert and oriented X3  Assessment & Plan:  Discussed with Dr. 02/27/14 D/c lopressor, start ACEi

## 2014-02-25 NOTE — Patient Instructions (Signed)
General Instructions:  Please bring your medicines with you each time you come to clinic.  Medicines may include prescription medications, over-the-counter medications, herbal remedies, eye drops, vitamins, or other pills.  Stop metoprolol, and START lisinopril 10mg  daily, return in 2 weeks for recheck and medication adjustment if needed  Progress Toward Treatment Goals:  Treatment Goal 02/25/2014  Blood pressure deteriorated    Self Care Goals & Plans:  Self Care Goal 02/25/2014  Manage my medications bring my medications to every visit; refill my medications on time; take my medicines as prescribed    No flowsheet data found.   Care Management & Community Referrals:  No flowsheet data found.    Lisinopril tablets What is this medicine? LISINOPRIL (lyse [place] pril) is an ACE inhibitor. This medicine is used to treat high blood pressure and heart failure. It is also used to protect the heart immediately after a heart attack. This medicine may be used for other purposes; ask your health care provider or pharmacist if you have questions. COMMON BRAND NAME(S): Prinivil, Zestril What should I tell my health care provider before I take this medicine? They need to know if you have any of these conditions: -diabetes -heart or blood vessel disease -immune system disease like lupus or scleroderma -kidney disease -low blood pressure -previous swelling of the tongue, face, or lips with difficulty breathing, difficulty swallowing, hoarseness, or tightening of the throat -an unusual or allergic reaction to lisinopril, other ACE inhibitors, insect venom, foods, dyes, or preservatives -pregnant or trying to get pregnant -breast-feeding How should I use this medicine? Take this medicine by mouth with a glass of water. Follow the directions on your prescription label. You may take this medicine with or without food. Take your medicine at regular intervals. Do not stop taking this medicine  except on the advice of your doctor or health care professional. Talk to your pediatrician regarding the use of this medicine in children. Special care may be needed. While this drug may be prescribed for children as young as 17 years of age for selected conditions, precautions do apply. Overdosage: If you think you have taken too much of this medicine contact a poison control center or emergency room at once. NOTE: This medicine is only for you. Do not share this medicine with others. What if I miss a dose? If you miss a dose, take it as soon as you can. If it is almost time for your next dose, take only that dose. Do not take double or extra doses. What may interact with this medicine? -diuretics -lithium -NSAIDs, medicines for pain and inflammation, like ibuprofen or naproxen -over-the-counter herbal supplements like hawthorn -potassium salts or potassium supplements -salt substitutes This list may not describe all possible interactions. Give your health care provider a list of all the medicines, herbs, non-prescription drugs, or dietary supplements you use. Also tell them if you smoke, drink alcohol, or use illegal drugs. Some items may interact with your medicine. What should I watch for while using this medicine? Visit your doctor or health care professional for regular check ups. Check your blood pressure as directed. Ask your doctor what your blood pressure should be, and when you should contact him or her. Call your doctor or health care professional if you notice an irregular or fast heart beat. Women should inform their doctor if they wish to become pregnant or think they might be pregnant. There is a potential for serious side effects to an unborn child. Talk to  your health care professional or pharmacist for more information. Check with your doctor or health care professional if you get an attack of severe diarrhea, nausea and vomiting, or if you sweat a lot. The loss of too much body  fluid can make it dangerous for you to take this medicine. You may get drowsy or dizzy. Do not drive, use machinery, or do anything that needs mental alertness until you know how this drug affects you. Do not stand or sit up quickly, especially if you are an older patient. This reduces the risk of dizzy or fainting spells. Alcohol can make you more drowsy and dizzy. Avoid alcoholic drinks. Avoid salt substitutes unless you are told otherwise by your doctor or health care professional. Do not treat yourself for coughs, colds, or pain while you are taking this medicine without asking your doctor or health care professional for advice. Some ingredients may increase your blood pressure. What side effects may I notice from receiving this medicine? Side effects that you should report to your doctor or health care professional as soon as possible: -abdominal pain with or without nausea or vomiting -allergic reactions like skin rash or hives, swelling of the hands, feet, face, lips, throat, or tongue -dark urine -difficulty breathing -dizzy, lightheaded or fainting spell -fever or sore throat -irregular heart beat, chest pain -pain or difficulty passing urine -redness, blistering, peeling or loosening of the skin, including inside the mouth -unusually weak -yellowing of the eyes or skin Side effects that usually do not require medical attention (report to your doctor or health care professional if they continue or are bothersome): -change in taste -cough -decreased sexual function or desire -headache -sun sensitivity -tiredness This list may not describe all possible side effects. Call your doctor for medical advice about side effects. You may report side effects to FDA at 1-800-FDA-1088. Where should I keep my medicine? Keep out of the reach of children. Store at room temperature between 15 and 30 degrees C (59 and 86 degrees F). Protect from moisture. Keep container tightly closed. Throw away  any unused medicine after the expiration date. NOTE: This sheet is a summary. It may not cover all possible information. If you have questions about this medicine, talk to your doctor, pharmacist, or health care provider.  2015, Elsevier/Gold Standard. (2007-10-23 17:36:32)

## 2014-02-25 NOTE — Assessment & Plan Note (Addendum)
Briefly mentioned lower extremity pain that is chronic while she was leaving. Mentions that she sees sports medicine and has known hx of bakers cyst. Apparently has been recommended for surgery but she refuses at this time given that her husband is in prison and she is alone supporting her family. She was wearing very tight jeans today and so I did not get to examine her full legs but no major tenderness to palpation on physical exam over the jeans today. However, looking through notes, I would recommend her to return to sports medicine if the pain is getting worse as soon as possible or back to our office for further evaluation and dedicated visit. She also REALLY needs an orange card as without that we are limited in referral and other testing she may need. Although MRI and ultrasounds have been done in the past.   -I will forward this note to her pcp to see if she may have any updates on this situation and what we could possibly do for her.  -maybe social work can assist as well, will forward to ms grady as well.

## 2014-02-25 NOTE — Assessment & Plan Note (Signed)
Flu vaccine today 

## 2014-02-25 NOTE — Assessment & Plan Note (Signed)
BP Readings from Last 3 Encounters:  02/25/14 150/80  02/07/14 138/96  11/06/13 144/93   Lab Results  Component Value Date   NA 138 02/07/2014   K 4.1 02/07/2014   CREATININE 0.50 02/07/2014    Assessment: Blood pressure control: mildly elevated Progress toward BP goal:  deteriorated Comments: improved to 140/90 on manual check  Plan: Medications:  looking at Dr. Caleen Jobs last note and agree that BB would not be first choice. given that her BP was elevated again today I think she may be able to be managed on mono-therapy. she claims she has been on HCTZ in the past but it hurt her legs so she does not want to try that. therefore, we will stop BB today and start ACEi and see how she does.  Lisinopril 10mg  for now, titrate up accordingly.  Other plans: follow up 2 weeks. Finances very limited and no orange card at this time, thus ideally would love to be able to keep her controlled with monotherapy but if need to add dual therapy, please try to keep on $4 list

## 2014-02-26 ENCOUNTER — Telehealth: Payer: Self-pay | Admitting: Licensed Clinical Social Worker

## 2014-02-26 NOTE — Progress Notes (Signed)
Case discussed with Dr. Qureshi soon after the resident saw the patient.  We reviewed the resident's history and exam and pertinent patient test results.  I agree with the assessment, diagnosis and plan of care documented in the resident's note. 

## 2014-02-26 NOTE — Telephone Encounter (Signed)
[name] was referred to CSW as pt in need of community resources.  CSW placed call to patient to discuss current needs.  Pt is current with her [facility] Orange card but was unaware about [employer] and [employer].  [name] barriers at this time is citizenship.  Pt is unable to receives resources because of the lack of documentation; however [name] is linked with [employer].  This is the best resources for patient at this time.  [name] voiced concern over the need for medication Albenza.  This medication does not have a PAP at this time and GoodRx still remains too expensive.  CSW reviewed pt's chart and it was noted of change to more affordable medication.  CSW inquired with [name] if she was able to pick up alternate prescription.  Pt states "yes, but I don't think it's working".  Pt did not mention this during her appointment on 02/25/14 stating it was not the same doctor that originally prescribed the medications.  CSW informed [name] will forward to triage RN.

## 2014-02-28 NOTE — Telephone Encounter (Signed)
I will defer this to Dr. Burtis Junes who saw her for the medication and may wish to change her treatment. I only saw her for blood pressure. However, if Dr. Burtis Junes wishes for her to be seen in opc she can request that but she may want to take care of this by phone. I will add Dr. Burtis Junes to this notice and also cc this to PCP who may be able to address the issue as well. I am away from [facility] until Monday but please discuss with them in the meantime.   Thanks,  Dr. Jannet Mantis

## 2014-03-01 NOTE — Telephone Encounter (Signed)
Pt should make an appt to be seen if still having concerns and undergo evaluation.

## 2014-03-05 ENCOUNTER — Telehealth: Payer: Self-pay | Admitting: *Deleted

## 2014-03-05 NOTE — Telephone Encounter (Signed)
Vicki Cook wanted triage nurse to call pt - taking med Albenza not working. Needs an appt in clinic. Message left on home phone recording to call clinic for a FU appt and can call clinic if any questions. Vicki Kidney [name] RN 03/05/14 3:10PM

## 2014-03-05 NOTE — Progress Notes (Signed)
Inquiring if Triage was able to follow up with this patient regarding her concern.  Was taking medication (substitute for Albenza) and stated she did not think it was working.

## 2014-03-18 ENCOUNTER — Encounter: Payer: Self-pay | Admitting: Family Medicine

## 2014-03-18 ENCOUNTER — Ambulatory Visit (INDEPENDENT_AMBULATORY_CARE_PROVIDER_SITE_OTHER): Payer: Self-pay | Admitting: Family Medicine

## 2014-03-18 VITALS — BP 144/93 | HR 78 | Ht 60.0 in | Wt 152.0 lb

## 2014-03-18 DIAGNOSIS — M25562 Pain in left knee: Secondary | ICD-10-CM

## 2014-03-18 MED ORDER — METHYLPREDNISOLONE ACETATE 40 MG/ML IJ SUSP
40.0000 mg | Freq: Once | INTRAMUSCULAR | Status: AC
  Administered 2014-03-18: 40 mg via INTRA_ARTICULAR

## 2014-03-18 NOTE — Telephone Encounter (Signed)
Please schedule asap per dr Burtis Junes and [place] Organ

## 2014-03-18 NOTE — Patient Instructions (Signed)
If you're not improving over the next month call me and we will try an injection in the cyst in the back. Continue with wrapping your knee. Icing as needed.

## 2014-03-20 DIAGNOSIS — M25561 Pain in right knee: Secondary | ICD-10-CM | POA: Insufficient documentation

## 2014-03-20 DIAGNOSIS — M25562 Pain in left knee: Secondary | ICD-10-CM

## 2014-03-20 HISTORY — DX: Pain in right knee: M25.561

## 2014-03-20 NOTE — Assessment & Plan Note (Signed)
primarily posterior and consistent with known large baker's cyst.  Has not had an MRI on this knee but I suspect it would be nearly identical to right knee MRI.  No DVT.  Improved some with intraarticular injection last visit - repeated this today.  If not improving could consider ultrasound guided injection directly into the cyst.  Icing, compression, elevation.  Consider referral to ortho surgery in the future as well if continues to have difficulty with this.   After informed written consent, patient was lying supine on exam table. Left knee was prepped with alcohol swab and utilizing superolateral approach under ultrasound guidance, patient's left knee was injected intraarticularly with 3:1 marcaine: depomedrol. Patient tolerated the procedure well without immediate complications.

## 2014-03-20 NOTE — Progress Notes (Signed)
Patient ID: Vicki Cook, female   DOB: [birth year], 53 y.o.   MRN: [id number]  PCP: Gara Kroner, MD  Subjective:   HPI: Patient is a 53 y.o. female here for left knee pain.  11/06/13: Patient has been seen previously in [place] office for issues with bilateral leg and knee swelling. She's had this for over 8 months now. No known injury. Causes pain, swelling, tightness in backs of knees into calf muscles. 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  No meniscal tears, other pathology of note. She had intraarticular injection of left knee which helped but was painful for first 2 weeks - pain has worsened over past month. Tried to have right bakers cyst aspirated and injection - only 1-2 mL thick cystic fluid obtained before injection. Left one is worse than right currently. Only has cone coverage at the moment. Tried ibuprofen 600 three times a day. Worse when on her feet or walking a lot.  03/18/14: Patient reports she improved some following last injection into left knee. Pain currently 4/10 and radiates to back of knee. Bothers her every day. Worse with prolonged sitting then going to get up. Has been wrapping with ACE bandage which has helped. No catching, locking, giving out.  Past Medical History  Diagnosis Date  . Hypertension     Current Outpatient Prescriptions on File Prior to Visit  Medication Sig Dispense Refill  . albendazole (ALBENZA) 200 MG tablet Take 2 tablets (400 mg total) by mouth once. In 2 weeks on 02/21/14 take the other 2 pills 4 tablet 0  . glucosamine-chondroitin 500-400 MG tablet Take 1 tablet by mouth 3 (three) times daily.    Marland Kitchen lisinopril (PRINIVIL,ZESTRIL) 10 MG tablet Take 1 tablet (10 mg total) by mouth daily. 30 tablet 1  . Pyrantel Pamoate 180 MG TABS Take 1 tablet (180 mg total) by mouth once. 1 tablet 0  . traMADol  (ULTRAM) 50 MG tablet Take 0.5-1 tablets (25-50 mg total) by mouth every 6 (six) hours as needed. 30 tablet 0   No current facility-administered medications on file prior to visit.    No past surgical history on file.  Allergies  Allergen Reactions  . Asa [Aspirin]     History   Social History  . Marital Status: Married    Spouse Name: N/A    Number of Children: N/A  . Years of Education: N/A   Occupational History  . Not on file.   Social History Main Topics  . Smoking status: Never Smoker   . Smokeless tobacco: Not on file  . Alcohol Use: No  . Drug Use: No  . Sexual Activity: Not Currently   Other Topics Concern  . Not on file   Social History Narrative    Family History  Problem Relation Age of Onset  . Heart disease Mother   . Hyperlipidemia Mother   . Hypertension Mother   . Heart disease Father   . Hypertension Father   . Diabetes Father     BP 144/93 mmHg  Pulse 78  Ht 5' (1.524 m)  Wt 152 lb (68.947 kg)  BMI 29.69 kg/m2  LMP 02/22/2014  Review of Systems: See HPI above.    Objective:  Physical Exam:  Gen: NAD  Left knee: Minimal effusion; moderate sized bakers cyst posterior knee.  No bruising, other deformity. No TTP currently. ROM 0 -  120 degrees without pain. Negative ant/post drawers. Negative valgus/varus testing. Negative lachmanns. Negative mcmurrays, apleys, patellar apprehension, clarkes. NV intact distally.    Assessment & Plan:  1. Left knee pain - primarily posterior and consistent with known large baker's cyst.  Has not had an MRI on this knee but I suspect it would be nearly identical to right knee MRI.  No DVT.  Improved some with intraarticular injection last visit - repeated this today.  If not improving could consider ultrasound guided injection directly into the cyst.  Icing, compression, elevation.  Consider referral to ortho surgery in the future as well if continues to have difficulty with this.   After informed  written consent, patient was lying supine on exam table. Left knee was prepped with alcohol swab and utilizing superolateral approach under ultrasound guidance, patient's left knee was injected intraarticularly with 3:1 marcaine: depomedrol. Patient tolerated the procedure well without immediate complications.

## 2014-04-23 ENCOUNTER — Encounter: Payer: Self-pay | Admitting: Internal Medicine

## 2014-04-23 ENCOUNTER — Ambulatory Visit (INDEPENDENT_AMBULATORY_CARE_PROVIDER_SITE_OTHER): Payer: Self-pay | Admitting: Internal Medicine

## 2014-04-23 VITALS — BP 126/77 | HR 87 | Temp 98.2°F | Ht 60.0 in | Wt 151.4 lb

## 2014-04-23 DIAGNOSIS — L282 Other prurigo: Secondary | ICD-10-CM

## 2014-04-23 DIAGNOSIS — B80 Enterobiasis: Secondary | ICD-10-CM

## 2014-04-23 DIAGNOSIS — I10 Essential (primary) hypertension: Secondary | ICD-10-CM

## 2014-04-23 MED ORDER — ALBENDAZOLE 200 MG PO TABS: 400.0000 mg | ORAL_TABLET | Freq: Once | ORAL | Status: DC

## 2014-04-23 NOTE — Patient Instructions (Addendum)
Please go to the [place] outpatient pharmacy to pick up the albendazole medication. Take 400mg  today and then 2 weeks later take another 400mg . Continue to wash all linens and hands and try to prevent re-infestation  i have referred you to dermatology today, they will call you for an appointment.  You may continue the hydrocortisone cream  Return to see pcp in 1-2 weeks, or sooner if the rash is getting worse or not improving  Try to keep skin moisturized to prevent dryness if that does not irritate the skin, nonscented lotions are better  Albendazole tablets What is this medicine? ALBENDAZOLE (al BEN da zole) is an antiparasitic. It is used to treat infections of tapeworms or other parasites. This medicine may be used for other purposes; ask your health care provider or pharmacist if you have questions. COMMON BRAND NAME(S): Albenza What should I tell my health care provider before I take this medicine? They need to know if you have any of these conditions: -anemia -biliary tract blockage -immune system problems -liver disease -an unusual or allergic reaction to albendazole, other medicines, foods, dyes, or preservatives -pregnant or trying to get pregnant -breast-feeding How should I use this medicine? Take this medicine by mouth with a glass of water. Follow the directions on the prescription label. Take this medicine with food. You can crush or chew this medicine. Take all of your medicine as directed even if you think your are better. Do not skip doses or stop your medicine early. Talk to your pediatrician regarding the use of this medicine in children. While this drug may be prescribed for children as young as 46 years of age for selected conditions, precautions do apply. Overdosage: If you think you have taken too much of this medicine contact a poison control center or emergency room at once. NOTE: This medicine is only for you. Do not share this medicine with others. What if I  miss a dose? If you miss a dose, take it as soon as you can. If it is almost time for your next dose, take only that dose. Do not take double or extra doses. What may interact with this medicine? -cimetidine -dexamethasone -praziquantel -theophylline This list may not describe all possible interactions. Give your health care provider a list of all the medicines, herbs, non-prescription drugs, or dietary supplements you use. Also tell them if you smoke, drink alcohol, or use illegal drugs. Some items may interact with your medicine. What should I watch for while using this medicine? Visit your doctor or health care professional as directed. Tell your doctor if your symptoms do not improve or if you get new symptoms.You will need to have blood work done every 2 weeks while you are taking this medicine. Do not get pregnant while taking this drug and for 1 month after your treatment. Talk to your doctor about effective birth control methods. Tell your doctor if you think you may be pregnant. You may get drowsy or dizzy. Do not drive, use machinery, or do anything that needs mental alertness until you know how this medicine affects you. Do not stand or sit up quickly, especially if you are an older patient. This reduces the risk of dizzy or fainting spells. What side effects may I notice from receiving this medicine? Side effects that you should report to your doctor or health care professional as soon as possible: -allergic reactions like skin rash, itching or hives, swelling of the face, lips, or tongue -changes in vision -diarrhea -  difficulty breathing -fast, irregular heartbeat -fever, chills, sore throat -pain, difficulty passing urine -redness, blistering, peeling or loosening of the skin, including the inside the mouth -seizures -unusual aches, pains -unusual bleeding, bruising -unusually weak or tired -yellowing of eyes, skin Side effects that usually do not require medical attention  (report to your doctor or health care professional if they continue or are bothersome): -dizziness -hair loss -headache -nausea, vomiting -stomach pain This list may not describe all possible side effects. Call your doctor for medical advice about side effects. You may report side effects to FDA at 1-800-FDA-1088. Where should I keep my medicine? Keep out of the reach of children. Store at room temperature between 20 and 25 degrees C (68 and 77 degrees F). Keep container tightly closed. Throw away any unused medicine after the expiration date. NOTE: This sheet is a summary. It may not cover all possible information. If you have questions about this medicine, talk to your doctor, pharmacist, or health care provider.  2015, Elsevier/Gold Standard. (2007-07-13 10:52:21)

## 2014-04-24 ENCOUNTER — Encounter: Payer: Self-pay | Admitting: Internal Medicine

## 2014-04-24 DIAGNOSIS — L282 Other prurigo: Secondary | ICD-10-CM | POA: Insufficient documentation

## 2014-04-24 NOTE — Assessment & Plan Note (Addendum)
Recurrent, worse in heat or with sweat. Darkened pruritic patches on lateral surfaces of neck with lichenified patch of skin on right lateral surface of neck as well. Also noted to have dry grayish skin on inside of elbows. ?eczema vs. Possible drug reaction to lisinopril? However, drug reaction to lisinopril seems less likely given that she was having the itching complaints before starting the medication and has had it before as well. She also endorses scattered joint pain and occasional dry mouth. Unclear if symptoms are all related to rash. Mild improvement in symptoms with hydrocortisone 2.5% cream that she is applying to affected areas that was prescribed by outside physician.   Patient seen and evaluated with Dr. Daryll Drown. Will hold off on stopping ACEi for now and continue to monitor and will also place a dermatology referral. In the meantime, continue supportive treatment with hydrocortisone cream. If rash worsens or does not improve, she is asked to let us know right away or if severe go directly to the emergency room. She voices understanding. It does not seem these symptoms are related to her pinworm infection, however, will monitor to see if improvement with treatment of parasite.  She did mention on the phone today that joint pain has resolved since taking the medication after her appointment.   -follow up in 1 week, if rash persists or worsening, consider d/c ACEi  -cbc, cmet, esr, crp ordered--she left clinic in a hurry and will need to return for lab work with follow up appointment in 1 week--preferably with pcp, discussed with front desk who will schedule appointment -dermatology referral placed -continue supportive measures with hydrocortisone cream for now and keep skin moisturized with non-scented lotion to try to prevent dry skin

## 2014-04-24 NOTE — Assessment & Plan Note (Addendum)
Continues to be symptomatic, with anal itching that is very bothersome. Could not afford albendazole that was initially prescribed in October by Dr. Burtis Junes and tried pyrantel pamoate with no improvement in symptoms x1 dose. She is requesting albendazole if it can be affordable as she believes she took that in he past and is the only thing that helps. She has been following good hygiene at home to try to prevent re-infection but says it never improved after one dose of the pyrantel pamoate in October and does not wish to retry that at this time.  She does not recall noticing any worms in her stool.   -I discussed with Dr. Ninetta Lights on the phone to see what other options we may have who directed me to the [facility] who may have some affordable option for albendazole. The pharmacy was able to secure the medicine for patient for $9 which she will pick up and is relieved to get.  -albendazole 400mg  x1, then repeat dose 2 weeks later; material on medication provided including possible side effects. she is not sexually active, denies possibility of being pregnant, last sexual encounter has been years ago. She is advised to return to clinic right away if she notices any worsening in rash or other side effects or if severe to go directly to emergency room -she was also supposed to have labs done, however, she had to leave the clinic in a hurry to get to the pharmacy and thus was not able to able to stay for rectal examination and drop by the lab.  She will need to return in 1 weeks time for lab work and re-evaluation. She may need scotch tape test again on follow up to ensure medication has been effective and eggs/worm no longer present -I educated her again on proper hygiene, washing hands frequently, bathing, nail cutting, and washing all linens and clothes to try to prevent re-infection which is common.  I also recommended treatment of household, but says she does not believe her kids her infected, they  have no complaints and they usually are not in the same space as her and she is careful with hygiene. She says she did give them one dose of the pyrantel pamoate a few months ago just in case.   I did speak to her on the phone today 12/23--she was able to pick up the medication and took a dose last night. Reports improvement in symptoms already with less itching and no joint pain today. She is very thankful for her visit and being able to get the medication and will follow up in 1 week.

## 2014-04-24 NOTE — Progress Notes (Signed)
Subjective:   Patient ID: Vicki Cook female   DOB: 1960-11-07 53 y.o.   MRN: [id number]  HPI: [name] is a 53 y.o. female with pmh as listed below prsenting to opc today for acute visit.   Pinworm-- reports could not afford albendazole when seen in October by Dr. Burtis Junes.  Her symptoms she says have not improved, still present, with anal itching that is very uncomfortable.  She did try the pyrantel pamoate in october which is over the counter but says that made no difference and she only took one dose and is requesting albendazole today if it can be affordable, because nothing else works.  She has had similar infection in the past and believes she took albendazole which treated the issue. She has been practicing good hygiene at home, washing all linens and handss. Does not believe children are infected.   Rash--dry itchy skin around neck and inside elbows. On going several months, worse when it is hot and she is sweating. Endorses patch of dry hardened itchy skin on her neck that has been present prior to starting lisinopril. Also endorses scattered joint pain and dry mouth possibly after starting ACEi, but cannot provide clear timeline. Mild relief and improvement with topical hydrocortisone cream 2% prescribed by outside provider. She says she has had this in the past but recently is noticing it again, maybe due to the heat being on at home. Denies fever, chills or abdominal pain.   Past Medical History  Diagnosis Date  . Hypertension    Current Outpatient Prescriptions  Medication Sig Dispense Refill  . albendazole (ALBENZA) 200 MG tablet Take 2 tablets (400 mg total) by mouth once. In 2 weeks on 05/07/13 take the other 2 pills (400mg ) 8 tablet 0  . glucosamine-chondroitin 500-400 MG tablet Take 1 tablet by mouth 3 (three) times daily.    lisinopril (PRINIVIL,ZESTRIL) 10 MG tablet Take 1 tablet (10 mg total) by mouth daily. 30 tablet 1  . traMADol (ULTRAM) 50 MG tablet Take  0.5-1 tablets (25-50 mg total) by mouth every 6 (six) hours as needed. 30 tablet 0   No current facility-administered medications for this visit.   Family History  Problem Relation Age of Onset  . Heart disease Mother   . Hyperlipidemia Mother   . Hypertension Mother   . Heart disease Father   . Hypertension Father   . Diabetes Father    History   Social History  . Marital Status: Married    Spouse Name: N/A    Number of Children: N/A  . Years of Education: N/A   Social History Main Topics  . Smoking status: Never Smoker   . Smokeless tobacco: None  . Alcohol Use: No  . Drug Use: No  . Sexual Activity: Not Currently   Other Topics Concern  . None   Social History Narrative   Review of Systems:  Constitutional:  Denies fever, chills  HEENT:  Dry mouth at times  Respiratory:  Denies SOB  Gastrointestinal:  Denies nausea, vomiting, abdominal pain.   Genitourinary:  Denies dysuria   Musculoskeletal:  Diffuse joint pain   Skin:  Dry itchy skin, mainly on neck and inside of arms   Rectal:  Anal itching    Objective:  Physical Exam: Filed Vitals:   04/23/14 1459  BP: 126/77  Pulse: 87  Temp: 98.2 F (36.8 C)  TempSrc: Oral  Height: 5' (1.524 m)  Weight: 151 lb 6.4 oz (68.675 kg)  SpO2: 100%   Vitals reviewed. General: sitting in chair, NAD HEENT: EOMI Cardiac: RRR Pulm: clear to auscultation bilaterally Abd: soft, BS present Ext: moving all extremities Neuro: alert and oriented X3 Skin: darkened areas on lateral portions of neck with patch of hardened dry skin on right side, pruritic, dry skin. Patches of gray dry skin inside surface of elbows. Visible excoriation marks where she has been scratching top of back and neck region. No obvious rash on abdomen, back, and lower extremities Assessment & Plan:  Discussed with Dr. Criselda Peaches Rash--?eczema, itching and dry skin around neck and inner arms; derm referral If worsening may need to stop acei? Applying  hydrocortisone cream for now Pinworm-- able to get albendazole approved from pharmacy

## 2014-04-24 NOTE — Assessment & Plan Note (Signed)
BP Readings from Last 3 Encounters:  04/23/14 126/77  03/18/14 144/93  02/25/14 140/90   Lab Results  Component Value Date   NA 138 02/07/2014   K 4.1 02/07/2014   CREATININE 0.50 02/07/2014   Assessment: Blood pressure control: controlled Progress toward BP goal:  at goal Comments: adherent to lisinopril  Plan: Medications:  continue current medications lisinopril 10mg  daily Educational resources provided:   Self management tools provided:   Other plans: monitor closely for possible worsening of rash or side-effects, may need to be discontinued. Follow up in one week

## 2014-04-24 NOTE — Progress Notes (Signed)
Internal Medicine Clinic Attending  I saw and evaluated the patient.  I personally confirmed the key portions of the history and exam documented by Dr. Qureshi and I reviewed pertinent patient test results.  The assessment, diagnosis, and plan were formulated together and I agree with the documentation in the resident's note. 

## 2014-05-01 ENCOUNTER — Ambulatory Visit (INDEPENDENT_AMBULATORY_CARE_PROVIDER_SITE_OTHER): Payer: Self-pay | Admitting: Internal Medicine

## 2014-05-01 ENCOUNTER — Encounter: Payer: Self-pay | Admitting: Internal Medicine

## 2014-05-01 VITALS — BP 148/77 | HR 84 | Temp 98.6°F | Ht 60.0 in | Wt 151.1 lb

## 2014-05-01 DIAGNOSIS — I10 Essential (primary) hypertension: Secondary | ICD-10-CM

## 2014-05-01 DIAGNOSIS — B80 Enterobiasis: Secondary | ICD-10-CM

## 2014-05-01 DIAGNOSIS — D509 Iron deficiency anemia, unspecified: Secondary | ICD-10-CM

## 2014-05-01 DIAGNOSIS — D72829 Elevated white blood cell count, unspecified: Secondary | ICD-10-CM

## 2014-05-01 DIAGNOSIS — L282 Other prurigo: Secondary | ICD-10-CM

## 2014-05-01 LAB — COMPLETE METABOLIC PANEL WITH GFR
ALBUMIN: 3.5 g/dL (ref 3.5–5.2)
ALK PHOS: 92 U/L (ref 39–117)
ALT: 14 U/L (ref 0–35)
AST: 14 U/L (ref 0–37)
BUN: 11 mg/dL (ref 6–23)
CALCIUM: 9.2 mg/dL (ref 8.4–10.5)
CHLORIDE: 102 meq/L (ref 96–112)
CO2: 28 meq/L (ref 19–32)
CREATININE: 0.5 mg/dL (ref 0.50–1.10)
Glucose, Bld: 77 mg/dL (ref 70–99)
Potassium: 4.1 mEq/L (ref 3.5–5.3)
Sodium: 137 mEq/L (ref 135–145)
Total Bilirubin: 0.3 mg/dL (ref 0.2–1.2)
Total Protein: 7.8 g/dL (ref 6.0–8.3)

## 2014-05-01 LAB — CBC WITH DIFFERENTIAL/PLATELET
Basophils Absolute: 0 10*3/uL (ref 0.0–0.1)
Basophils Relative: 0 % (ref 0–1)
Eosinophils Absolute: 0.2 10*3/uL (ref 0.0–0.7)
Eosinophils Relative: 2 % (ref 0–5)
HEMATOCRIT: 33.7 % — AB (ref 36.0–46.0)
Hemoglobin: 11.5 g/dL — ABNORMAL LOW (ref 12.0–15.0)
LYMPHS PCT: 14 % (ref 12–46)
Lymphs Abs: 1.6 10*3/uL (ref 0.7–4.0)
MCH: 25.8 pg — ABNORMAL LOW (ref 26.0–34.0)
MCHC: 34.1 g/dL (ref 30.0–36.0)
MCV: 75.7 fL — ABNORMAL LOW (ref 78.0–100.0)
MONO ABS: 0.7 10*3/uL (ref 0.1–1.0)
MPV: 8.4 fL — ABNORMAL LOW (ref 8.6–12.4)
Monocytes Relative: 6 % (ref 3–12)
Neutro Abs: 8.8 10*3/uL — ABNORMAL HIGH (ref 1.7–7.7)
Neutrophils Relative %: 78 % — ABNORMAL HIGH (ref 43–77)
Platelets: 438 10*3/uL — ABNORMAL HIGH (ref 150–400)
RBC: 4.45 MIL/uL (ref 3.87–5.11)
RDW: 14.7 % (ref 11.5–15.5)
WBC: 11.3 10*3/uL — ABNORMAL HIGH (ref 4.0–10.5)

## 2014-05-01 LAB — C-REACTIVE PROTEIN: CRP: 3.4 mg/dL — ABNORMAL HIGH (ref <0.60)

## 2014-05-01 MED ORDER — AMLODIPINE BESYLATE 5 MG PO TABS: 5.0000 mg | ORAL_TABLET | Freq: Every day | ORAL | Status: DC

## 2014-05-01 NOTE — Patient Instructions (Addendum)
General Instructions: -Stop taking lisinopril.  -Start taking amlodipine 5mg  daily.  -Take Albenza next week on 05/07/13. -Call 07/05/13 with update on your blood pressure. A good blood pressure range is between 100/70 to 140/90.  -Follow up with Dr. [place] in 3 months or sooner as needed.    Happy New Year!     Please bring your medicines with you each time you come to clinic.  Medicines may include prescription medications, over-the-counter medications, herbal remedies, eye drops, vitamins, or other pills.   Progress Toward Treatment Goals:  Treatment Goal 04/23/2014  Blood pressure at goal    Self Care Goals & Plans:  Self Care Goal 05/01/2014  Manage my medications take my medicines as prescribed; bring my medications to every visit; refill my medications on time  Eat healthy foods drink diet soda or water instead of juice or soda; eat more vegetables; eat foods that are low in salt; eat baked foods instead of fried foods; eat fruit for snacks and desserts    No flowsheet data found.   Care Management & Community Referrals:  No flowsheet data found.

## 2014-05-02 DIAGNOSIS — D72829 Elevated white blood cell count, unspecified: Secondary | ICD-10-CM | POA: Insufficient documentation

## 2014-05-02 DIAGNOSIS — D509 Iron deficiency anemia, unspecified: Secondary | ICD-10-CM | POA: Insufficient documentation

## 2014-05-02 LAB — SEDIMENTATION RATE: Sed Rate: 89 mm/hr — ABNORMAL HIGH (ref 0–22)

## 2014-05-02 NOTE — Progress Notes (Signed)
   Subjective:    Patient ID: Vicki Cook, female    DOB: 10-23-1960, 53 y.o.   MRN: [id number]  HPI Vicki Cook is a 53 yr old woman with PMH of HTN and recurrent pinwork infection presenting for follow up visit for pinworm infection. She states that since taking albendazole once the itching in her anal area has much improved but she still has some mild discomfort. She denies pain with defecation, blood in her stools, or purulent discharge. She has washed her linens, undergarments, clothes in hot water.  She also has a pruritic rash in her right neck and right decubital fossa that have improved with hydrocortisone cream application.  In addition she reports a dry cough of 2 weeks duration; she is concerned this is a side effect of the lisinopril.    Review of Systems  Constitutional: Negative for fever, chills and appetite change.  HENT: Negative for congestion.   Respiratory: Positive for cough. Negative for shortness of breath.   Cardiovascular: Negative for chest pain, palpitations and leg swelling.  Gastrointestinal: Negative for vomiting, abdominal pain, diarrhea, constipation, blood in stool and anal bleeding.  Genitourinary: Negative for dysuria.  Skin: Positive for rash.  Neurological: Negative for light-headedness.  Psychiatric/Behavioral: Negative for agitation.       Objective:   Physical Exam  Constitutional: She is oriented to person, place, and time. She appears well-developed and well-nourished. No distress.  Cardiovascular: Normal rate.   Pulmonary/Chest: Effort normal. No respiratory distress. She has no wheezes. She has no rales.  Genitourinary:  Patient declines rectal/anal exam  Neurological: She is alert and oriented to person, place, and time. Coordination normal.  Skin: Skin is warm and dry. Rash noted. She is not diaphoretic.  Patch ~ 2cm by 4cm at her right lower neck with dark discoloration, no pustule or skin break, patch at her right decubital fossa  with dark discoloration/dry skin, no skin break, ~2cm in diameter.    Psychiatric: She has a normal mood and affect.  Nursing note and vitals reviewed.         Assessment & Plan:

## 2014-05-02 NOTE — Assessment & Plan Note (Signed)
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  She is an [place] immigrant, would recommend further work up for TB, HIV, screening. No s/s of acitve TB (no fever/chills, weight loss, only a mild dry cough). Will need further work up during her next visit.

## 2014-05-02 NOTE — Assessment & Plan Note (Signed)
Her rash is improving with hydrocortisone cream, etiology most likely eczema, drug rash unlikely given its scattered pattern and rapid improvement with hydrocortisone cream.  -Pt will continue using hydrocortisone cream

## 2014-05-02 NOTE — Progress Notes (Signed)
Medicine attending: Medical history, presenting problems, physical findings, and medications, reviewed with Dr Kennerly on the day of the patient encounter and I concur with her evaluation and management plan. 

## 2014-05-02 NOTE — Assessment & Plan Note (Signed)
Discontinued lisinopril due to the dry cough.  Start amlodipine 5mg  daily ($4 at ).  She was asked to follow up in 2 weeks but may not be able to return until 1 month.

## 2014-05-02 NOTE — Assessment & Plan Note (Addendum)
Mild. Hg 11.5, MCV 75, most likely iron deficiency anemia.  She needs an anemia panel to confirm iron deficiency, however, due to the clinic being closed the date of this dictation (due to the [facility]), the blood samples could not be located by the [facility] lab and an order could no be entered.

## 2014-05-02 NOTE — Assessment & Plan Note (Addendum)
The anal itching improved with albendazole 400mg . She has no blood in her her stool, no pain with defecation, no purulent discharge. She decline anal/rectal examination, states that she is "better".  -Encouraged pt to take her second dose of albendazole on 05/07/13.  -Had CBC, CMET, CRP and Sed rate drawn (as requested per Dr. 07/05/13).  Her CBC shows mild leucocytosis which is chronic. She is afebrile, nontoxic with systemic infection unlikely. Her current pinwork infection could explain this mild leucocytosis.  She has mild anemia that will need to worked up with anemia panel.  -CMET unremarkable.  -CRP and Sed rate elevated but again, in the context of current pinworm infection.  Pt to follow up with Dr. Virgina Organ as needed and in 3 months.

## 2014-05-29 ENCOUNTER — Encounter: Payer: Self-pay | Admitting: Internal Medicine

## 2014-06-03 NOTE — Addendum Note (Signed)
Addended by: Remus Blake on: 06/03/2014 10:37 AM   Modules accepted: Orders

## 2014-06-04 ENCOUNTER — Encounter: Payer: Self-pay | Admitting: Internal Medicine

## 2014-07-08 ENCOUNTER — Ambulatory Visit: Payer: Self-pay

## 2014-07-15 ENCOUNTER — Ambulatory Visit: Payer: Self-pay

## 2014-07-19 ENCOUNTER — Telehealth: Payer: Self-pay | Admitting: *Deleted

## 2014-07-19 ENCOUNTER — Encounter: Payer: Self-pay | Admitting: Internal Medicine

## 2014-07-19 ENCOUNTER — Ambulatory Visit (HOSPITAL_COMMUNITY)
Admission: RE | Admit: 2014-07-19 | Discharge: 2014-07-19 | Disposition: A | Discharge status: Home / Self Care | Payer: Self-pay | Source: Ambulatory Visit | Attending: Family Medicine | Admitting: Family Medicine

## 2014-07-19 ENCOUNTER — Ambulatory Visit (INDEPENDENT_AMBULATORY_CARE_PROVIDER_SITE_OTHER): Payer: Self-pay | Admitting: Internal Medicine

## 2014-07-19 VITALS — BP 147/88 | HR 136 | Temp 99.4°F | Ht 60.0 in | Wt 156.1 lb

## 2014-07-19 DIAGNOSIS — F410 Panic disorder [episodic paroxysmal anxiety] without agoraphobia: Secondary | ICD-10-CM | POA: Insufficient documentation

## 2014-07-19 DIAGNOSIS — R000 Tachycardia, unspecified: Secondary | ICD-10-CM | POA: Insufficient documentation

## 2014-07-19 DIAGNOSIS — F418 Other specified anxiety disorders: Secondary | ICD-10-CM

## 2014-07-19 DIAGNOSIS — I10 Essential (primary) hypertension: Secondary | ICD-10-CM

## 2014-07-19 HISTORY — DX: Panic disorder (episodic paroxysmal anxiety): F41.0

## 2014-07-19 LAB — TSH: TSH: 2.056 u[IU]/mL (ref 0.350–4.500)

## 2014-07-19 LAB — TROPONIN I

## 2014-07-19 MED ORDER — LORAZEPAM 0.5 MG PO TABS: 0.5000 mg | ORAL_TABLET | Freq: Three times a day (TID) | ORAL | Status: DC | PRN

## 2014-07-19 MED ORDER — CITALOPRAM HYDROBROMIDE 10 MG PO TABS: 10.0000 mg | ORAL_TABLET | Freq: Every day | ORAL | Status: DC

## 2014-07-19 NOTE — Assessment & Plan Note (Signed)
BP Readings from Last 3 Encounters:  07/19/14 147/88  05/01/14 148/77  04/23/14 126/77    Lab Results  Component Value Date   NA 137 05/01/2014   K 4.1 05/01/2014   CREATININE 0.50 05/01/2014    Assessment: Blood pressure control: mildly elevated Progress toward BP goal:  unchanged Comments: Coughing is improved after stopping lisinopril. However, she reports some coughing last night after vomiting likely due to throat irritation. Blood pressure likely elevated in the setting of panic attack.  Plan: Medications:  continue current medications: Amlodipine 5 mg daily. Other plans: Recheck blood pressure after resolution of panic attack in 1 week.

## 2014-07-19 NOTE — Progress Notes (Signed)
   Subjective:    Patient ID: Vicki Cook, female    DOB: Jun 26, 1960, 54 y.o.   MRN: [id number]  HPI [name] is a 54 year old woman with history of hypertension and recurrent pinworm infection presenting with the chief complaint of vomiting and cough.  She reports being very stressed out lately because she is concerned that her husband is going to be deported.  She found out yesterday afternoon that a lawyer who had promised to help with the case had not submitted any paperwork.  She has also been working long hours to support her children as well.  She reports feeling anxious after that and feeling chest pressure.  She later developed nausea and vomiting after eating pasta with mushrooms.  She threw up several times last night.  She last threw up before coming to the appointment.  She reports having a dry cough off and on for the last couple of months, and she was switched from lisinopril to amlodipine a couple of months ago with resolution of her cough.  She developed a recurrent cough last night as well after throwing up. She reports having similar episodes previously during stressful times in her life.  Review of Systems  Constitutional: Positive for diaphoresis, activity change and fatigue. Negative for fever and chills.  HENT: Positive for sore throat (Dry from vomiting.). Negative for congestion and rhinorrhea.   Respiratory: Positive for cough (after vomiting.). Negative for apnea, choking and wheezing.   Cardiovascular: Positive for chest pain (Pressure.) and palpitations.  Gastrointestinal: Positive for nausea and vomiting. Negative for abdominal pain, diarrhea and constipation.  Genitourinary: Negative for dysuria.  Musculoskeletal: Negative for myalgias and arthralgias.  Skin: Negative for rash.  Neurological: Positive for dizziness. Negative for weakness, light-headedness and numbness.       Objective:   Physical Exam  Constitutional: She is oriented to person,  place, and time. She appears well-developed and well-nourished. She appears distressed.  Very anxious appearing.  HENT:  Head: Normocephalic and atraumatic.  Mouth/Throat: No oropharyngeal exudate.  Eyes: Conjunctivae and EOM are normal. Pupils are equal, round, and reactive to light. No scleral icterus.  Neck: Normal range of motion. Neck supple.  Cardiovascular: Regular rhythm.  Exam reveals no gallop and no friction rub.   No murmur heard. Tachycardic.  Pulmonary/Chest: Effort normal and breath sounds normal. No respiratory distress. She has no wheezes.  Abdominal: Soft. Bowel sounds are normal. She exhibits no distension. There is no tenderness.  Musculoskeletal: Normal range of motion. She exhibits no edema or tenderness.  Neurological: She is alert and oriented to person, place, and time. No cranial nerve deficit. She exhibits normal muscle tone.  Skin: Skin is warm. No rash noted. She is diaphoretic. No erythema.          Assessment & Plan:  Please see problem-based assessment and plan.

## 2014-07-19 NOTE — Telephone Encounter (Signed)
Pt called  - since BP has been changed - appetite down, productive cough - pt could not state color and vomiting. Problems sleeping. Appt made for 07/19/14 3:45PM Dr Glenard Haring. Stanton Kidney Venson Ferencz RN 07/19/14 1:45PM

## 2014-07-19 NOTE — Assessment & Plan Note (Signed)
The patient's symptoms are most consistent with a panic attack triggered by anxiety related to her husband's possible deportation. EKG performed in clinic showed sinus tachycardia with occasional premature ventricular complexes and possible left atrial enlargement. No signs of ischemia were seen. We will rule out ACS and thyroid storm with stat labs, but these appear to be very unlikely. Her vomiting was likely due to her anxiety and led to irritation of her throat and coughing. She's had similar episodes in the past, so she would benefit from being on an SSRI to prevent attacks. She is not currently orthostatic with fluctuations in her heart rate due to her anxiety. -Stat TSH and troponin I. We'll have on-call resident follow-up labs and contact patient if she needs to return to the emergency room. She prefers to be called on her home telephone [phone]. -Lorazepam 0.5 mg every 8 hours as needed. -Start citalopram 10 mg daily. -Follow-up in one week. -She would benefit from cognitive behavioral therapy if this can be arranged.

## 2014-07-19 NOTE — Patient Instructions (Signed)
Thank you for coming to clinic today [name].  General instructions: -I think your symptoms are most likely due to a panic attack. -I would like you to pick up some lorazepam, which will help you relax.  This may make you drowsy, so don't take it before you need to drive. -I would also like to start you on a medication called citalopram, which will help prevent anxiety flares in the future. -Please make a follow up appointment to return to clinic in 1 week.  Please bring your medicines with you each time you come.   Medicines may be  Eye drops  Herbal   Vitamins  Pills  Seeing these help [place] take care of you.

## 2014-07-20 NOTE — Progress Notes (Signed)
Internal Medicine Clinic Attending  Case discussed with Dr. Moding soon after the resident saw the patient.  We reviewed the resident's history and exam and pertinent patient test results.  I agree with the assessment, diagnosis, and plan of care documented in the resident's note. 

## 2014-07-22 ENCOUNTER — Telehealth: Payer: Self-pay | Admitting: *Deleted

## 2014-07-22 ENCOUNTER — Ambulatory Visit (INDEPENDENT_AMBULATORY_CARE_PROVIDER_SITE_OTHER): Payer: Self-pay | Admitting: Internal Medicine

## 2014-07-22 ENCOUNTER — Encounter: Payer: Self-pay | Admitting: Internal Medicine

## 2014-07-22 VITALS — BP 117/70 | HR 104 | Temp 98.5°F | Ht 60.0 in | Wt 150.7 lb

## 2014-07-22 DIAGNOSIS — R059 Cough, unspecified: Secondary | ICD-10-CM

## 2014-07-22 DIAGNOSIS — R05 Cough: Secondary | ICD-10-CM

## 2014-07-22 DIAGNOSIS — R053 Chronic cough: Secondary | ICD-10-CM

## 2014-07-22 DIAGNOSIS — F410 Panic disorder [episodic paroxysmal anxiety] without agoraphobia: Secondary | ICD-10-CM

## 2014-07-22 DIAGNOSIS — I10 Essential (primary) hypertension: Secondary | ICD-10-CM

## 2014-07-22 HISTORY — DX: Cough, unspecified: R05.9

## 2014-07-22 MED ORDER — AZITHROMYCIN 250 MG PO TABS
ORAL_TABLET | ORAL | Status: DC
Start: 2014-07-22 — End: 2015-02-19

## 2014-07-22 NOTE — Telephone Encounter (Signed)
Pt called coughing getting worse. Chest hurts from all the coughing. Pt denies vomiting. [employer] has expired and no money to go to ER or Urgent Care. Talked with Dr Meredith Pel x 2 this AM and tried to page Dr Glenard Haring x 2. Called pt - there is an appt today 3:45PM. Stanton Kidney Nyree Yonker RN 07/22/14 1:20PM

## 2014-07-22 NOTE — Assessment & Plan Note (Signed)
Assessment: Pt with recently diagnosed panic attack with unclear history of anxiety disorder in setting of recent stressor who presents with improved symptoms.    Plan:  -Pt has not picked up prescription for citalopram 10 mg daily, will need to reassess at next visit to determine if SSRI is still warranted   -Continue lorazepam 0.5 mg Q 8 hr PRN anxiety

## 2014-07-22 NOTE — Assessment & Plan Note (Addendum)
Assessment: Pt is a non-smoker with chronic cough of 3-4 month duration with no prior chest xray who presents with persistent cough despite discontinuation of ACEi therapy of unclear etiology, possibly due to bronchitis vs pneumonitis vs untreated GERD vs PND in setting of allergic rhinitis vs cough variant asthma vs TB vs pertusis.   Plan:  -Pt declined obtaining chest xray imaging today due to financial difficulty, would like to try antibiotics first  -Prescribe zpack for 5 day duration ($9 at [employer]) for possible bronchitis as pt reports resolution of similar symptoms a few years ago  -Pt declined anti-tussive medication  -Pt instructed to return in 1 week if symptoms do not improve. Chest xray will have to be obtained at that time which she is agreeable to. If chest xray is negative will try 3 month trial of nexium 20 mg BID for GERD.

## 2014-07-22 NOTE — Progress Notes (Signed)
Patient ID: Vicki Cook, female   DOB: 10-21-1960, 54 y.o.   MRN: [id number]    Subjective:   Patient ID: Vicki Cook female   DOB: May 04, 1960 54 y.o.   MRN: [id number]  HPI: Vicki Cook is a 54 y.o. woman with past medical history of hypertension, history of ringworm infection, bilateral Baker's cyst, and chronic microcytic anemia who presents with chronic cough.   She was recently seen on 07/19/14 for vomiting most likely post-emesis cough and found to have HR of 136 where 12-lead EKG revealed sinus tachycardia with negative troponin x 1 and normal TSH. She was thought to have panic attack and started on celexa which she did not pick up and ativan as needed which she reports helped with the vomiting. Her cough worsened last night especially with eating and drinking. She reports having a chronic cough for the past 3-4 months which temporarily improved for about 1 month after her lisinopril was discontinued but then returned. It is dry with occasional production of clear colored phlegm with no hemoptysis. She has chest discomfort with coughing but denies dyspnea, wheezing, nasal congestion, sneezing, red/watery/itchy eyes, PND, sore throat, fever, chills, heartburn, regurgitation, dysphagia, odynophagia, weight loss, or recent sick contacts. She denies history of asthma, COPD, allergic rhinitis, or GERD.  She has never smoked cigarettes in the past. She is not currently on PPI therapy or allergy medications. She had annual influenza vaccination this past year. It is unclear if she has had PPD testing in the past. She reports having similar cough about 2 years ago that improved with antibiotic therapy. She has not had prior chest xray and declines one today due to financial difficulty.       Past Medical History  Diagnosis Date  . Hypertension   . H/O pinworm infection    Current Outpatient Prescriptions  Medication Sig Dispense Refill  . amLODipine (NORVASC) 5 MG tablet Take 1  tablet (5 mg total) by mouth daily. 30 tablet 3  . azithromycin (ZITHROMAX Z-PAK) 250 MG tablet 2 po day one, then 1 daily x 4 days 5 tablet 0  . citalopram (CELEXA) 10 MG tablet Take 1 tablet (10 mg total) by mouth daily. 30 tablet 2  . glucosamine-chondroitin 500-400 MG tablet Take 1 tablet by mouth 3 (three) times daily.    Marland Kitchen LORazepam (ATIVAN) 0.5 MG tablet Take 1 tablet (0.5 mg total) by mouth every 8 (eight) hours as needed for anxiety. 20 tablet 0  . traMADol (ULTRAM) 50 MG tablet Take 0.5-1 tablets (25-50 mg total) by mouth every 6 (six) hours as needed. 30 tablet 0   No current facility-administered medications for this visit.   Family History  Problem Relation Age of Onset  . Heart disease Mother   . Hyperlipidemia Mother   . Hypertension Mother   . Heart disease Father   . Hypertension Father   . Diabetes Father    History   Social History  . Marital Status: Married    Spouse Name: N/A  . Number of Children: N/A  . Years of Education: N/A   Social History Main Topics  . Smoking status: Never Smoker   . Smokeless tobacco: Not on file  . Alcohol Use: No  . Drug Use: No  . Sexual Activity: Not Currently     Comment: has not had sex for several years   Other Topics Concern  . None   Social History Narrative   Review of Systems: Review of Systems  Constitutional: Negative for fever and chills.  HENT: Negative for congestion and sore throat.   Eyes: Negative for discharge and redness.  Respiratory: Positive for cough. Negative for hemoptysis, sputum production, shortness of breath and wheezing.   Cardiovascular: Negative for chest pain.       Chest discomfort with coughing  Gastrointestinal: Negative for heartburn, nausea, vomiting (resolved ), abdominal pain, diarrhea and constipation.  Genitourinary: Negative for dysuria, urgency and frequency.  Musculoskeletal: Positive for joint pain (chronic b/l knee pain).  Endo/Heme/Allergies: Negative for environmental  allergies.    Objective:  Physical Exam: Filed Vitals:   07/22/14 1619  BP: 117/70  Pulse: 104  Temp: 98.5 F (36.9 C)  TempSrc: Oral  Height: 5' (1.524 m)  Weight: 150 lb 11.2 oz (68.357 kg)  SpO2: 100%     Physical Exam  Constitutional: She is oriented to person, place, and time. She appears well-developed and well-nourished. No distress.  Pt with hacking cough during interview and examination.  HENT:  Head: Normocephalic and atraumatic.  Right Ear: External ear normal.  Left Ear: External ear normal.  Nose: Nose normal.  Mouth/Throat: Oropharynx is clear and moist. No oropharyngeal exudate.  Eyes: Conjunctivae and EOM are normal. Pupils are equal, round, and reactive to light. Right eye exhibits no discharge. Left eye exhibits no discharge. No scleral icterus.  Neck: Normal range of motion. Neck supple.  Cardiovascular: Normal rate, regular rhythm and normal heart sounds.   Pulmonary/Chest: Effort normal and breath sounds normal. No respiratory distress. She has no wheezes. She has no rales.  Abdominal: Soft. Bowel sounds are normal. She exhibits no distension. There is no tenderness. There is no rebound and no guarding.  Musculoskeletal: Normal range of motion. She exhibits no edema or tenderness.  Neurological: She is alert and oriented to person, place, and time.  Skin: Skin is warm and dry. No rash noted. She is not diaphoretic. No erythema. No pallor.  Psychiatric: She has a normal mood and affect. Her behavior is normal. Judgment and thought content normal.    Assessment & Plan:   Please see problem list for problem-based assessment and plan

## 2014-07-22 NOTE — Assessment & Plan Note (Addendum)
Assessment: Pt with well-controlled hypertension compliant with one-class (CCB) anti-hypertensive therapy who presents with blood pressure of 117/70.   Plan: -BP 117/70 at goal <140/90 -Continue amlodipine 5 mg daily   -Last CMP normal on 05/01/14

## 2014-07-22 NOTE — Patient Instructions (Addendum)
-  Start taking zpack for the 5 days, follow the instructions on the bottle -If your cough does not improve, please come back in 1 week for a chest xray -Nice meeting you today and hope you feel better soon!  General Instructions:   Please bring your medicines with you each time you come to clinic.  Medicines may include prescription medications, over-the-counter medications, herbal remedies, eye drops, vitamins, or other pills.   Progress Toward Treatment Goals:  Treatment Goal 07/19/2014  Blood pressure unchanged    Self Care Goals & Plans:  Self Care Goal 07/22/2014  Manage my medications take my medicines as prescribed; bring my medications to every visit; refill my medications on time  Eat healthy foods eat more vegetables; eat foods that are low in salt; eat baked foods instead of fried foods    No flowsheet data found.   Care Management & Community Referrals:  No flowsheet data found.

## 2014-07-23 NOTE — Progress Notes (Signed)
Case discussed with Dr. Rabbani soon after the resident saw the patient.  We reviewed the resident's history and exam and pertinent patient test results.  I agree with the assessment, diagnosis, and plan of care documented in the resident's note. 

## 2014-07-29 ENCOUNTER — Telehealth: Payer: Self-pay | Admitting: *Deleted

## 2014-07-29 ENCOUNTER — Other Ambulatory Visit: Payer: Self-pay | Admitting: Internal Medicine

## 2014-07-29 ENCOUNTER — Ambulatory Visit: Payer: Self-pay

## 2014-07-29 MED ORDER — BENZONATATE 100 MG PO CAPS: 100.0000 mg | ORAL_CAPSULE | Freq: Two times a day (BID) | ORAL | Status: DC | PRN

## 2014-07-29 NOTE — Telephone Encounter (Signed)
Pt informed

## 2014-07-29 NOTE — Telephone Encounter (Signed)
I sent in tessalon to [facility], please let her know, thanks!  Dr. Johna Roles

## 2014-07-29 NOTE — Telephone Encounter (Signed)
Pt wants anti-tussive called in for her.

## 2014-08-05 ENCOUNTER — Ambulatory Visit: Payer: Self-pay

## 2014-08-06 ENCOUNTER — Telehealth: Payer: Self-pay | Admitting: Internal Medicine

## 2014-08-06 NOTE — Telephone Encounter (Signed)
Call to patient to confirm appointment for 08/07/14 at 10:30 lmtcb

## 2014-08-07 ENCOUNTER — Ambulatory Visit: Payer: Self-pay

## 2014-09-01 ENCOUNTER — Other Ambulatory Visit: Payer: Self-pay | Admitting: Internal Medicine

## 2014-12-04 ENCOUNTER — Ambulatory Visit: Payer: Self-pay

## 2014-12-11 ENCOUNTER — Telehealth: Payer: Self-pay | Admitting: Internal Medicine

## 2014-12-11 NOTE — Telephone Encounter (Signed)
Call to patient to confirm appointment for 12/12/14 at 2:00 lmtcb

## 2014-12-12 ENCOUNTER — Ambulatory Visit: Payer: Self-pay

## 2014-12-16 ENCOUNTER — Ambulatory Visit: Payer: Self-pay

## 2014-12-29 ENCOUNTER — Other Ambulatory Visit: Payer: Self-pay | Admitting: Internal Medicine

## 2015-02-19 ENCOUNTER — Ambulatory Visit (INDEPENDENT_AMBULATORY_CARE_PROVIDER_SITE_OTHER): Payer: Self-pay | Admitting: Internal Medicine

## 2015-02-19 VITALS — BP 151/85 | HR 77 | Temp 98.3°F | Wt 156.9 lb

## 2015-02-19 DIAGNOSIS — Z1211 Encounter for screening for malignant neoplasm of colon: Secondary | ICD-10-CM

## 2015-02-19 DIAGNOSIS — I10 Essential (primary) hypertension: Secondary | ICD-10-CM

## 2015-02-19 DIAGNOSIS — Z23 Encounter for immunization: Secondary | ICD-10-CM

## 2015-02-19 DIAGNOSIS — L290 Pruritus ani: Secondary | ICD-10-CM | POA: Insufficient documentation

## 2015-02-19 DIAGNOSIS — Z8659 Personal history of other mental and behavioral disorders: Secondary | ICD-10-CM

## 2015-02-19 DIAGNOSIS — F410 Panic disorder [episodic paroxysmal anxiety] without agoraphobia: Secondary | ICD-10-CM

## 2015-02-19 DIAGNOSIS — Z0000 Encounter for general adult medical examination without abnormal findings: Secondary | ICD-10-CM

## 2015-02-19 MED ORDER — AMLODIPINE BESYLATE 10 MG PO TABS
10.0000 mg | ORAL_TABLET | Freq: Every day | ORAL | Status: DC
Start: 2015-02-19 — End: 2016-03-02

## 2015-02-19 MED ORDER — HYDROCORTISONE 1 % EX CREA: TOPICAL_CREAM | CUTANEOUS | Status: DC

## 2015-02-19 MED ORDER — ZINC OXIDE 10 % EX CREA: TOPICAL_CREAM | CUTANEOUS | Status: DC

## 2015-02-19 MED ORDER — CETIRIZINE HCL 10 MG PO CAPS: 1.0000 | ORAL_CAPSULE | Freq: Every day | ORAL | Status: DC

## 2015-02-19 NOTE — Assessment & Plan Note (Signed)
Placed referral for mammogram and given flu shot today. She has the orange card and would not be able to afford a colonoscopy. She was given stool cards today.

## 2015-02-19 NOTE — Progress Notes (Signed)
Subjective:   Patient ID: Vicki Cook female   DOB: 1960/09/11 54 y.o.   MRN: [id number]  HPI: Vicki Cook is a 54 y.o. w/ PMHx outline below who presents to clinic for biannual f/u and anal pruritis. Pt state she has had anal pruritis x 3-4 months that is relieved with hydrocortisone cream that she uses for her neck eczema. She has daily anal itching that occurs intermittently throughout the day with out any specific time for occurrence. 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 This episode of anal pruritis is different from previous episodes when she had enterobius vermicularis b/c this time it feels more of a dry, tight itchy sensation around her anus whereas previously it was also itchy inside her rectum and drove her crazy. Denies fevers, anal trauma, diarrhea, blood in her stools, and rectal pain.   Please see problem list for status of the pt's chronic medical problems.  Past Medical History  Diagnosis Date  . Hypertension   . H/O pinworm infection    Current Outpatient Prescriptions  Medication Sig Dispense Refill  . amLODipine (NORVASC) 10 MG tablet Take 1 tablet (10 mg total) by mouth daily. 30 tablet 11  . Cetirizine HCl 10 MG CAPS Take 1 capsule (10 mg total) by mouth daily. 30 capsule 11  . hydrocortisone cream 1 % Apply to affected area 2 times daily 30 g 1  . ZINC OXIDE, TOPICAL, 10 % CREA Apply to affected area BID prn 50 g 0   No current facility-administered medications for this visit.   Family History  Problem Relation Age of Onset  . Heart disease Mother   . Hyperlipidemia Mother   . Hypertension Mother   . Heart disease Father   . Hypertension Father   . Diabetes Father    Social History   Social History  . Marital Status: Married    Spouse Name: N/A  . Number of Children: N/A  . Years of Education: N/A   Social History Main Topics  . Smoking status: Never Smoker   . Smokeless tobacco: Not on  file  . Alcohol Use: No  . Drug Use: No  . Sexual Activity: Not Currently     Comment: has not had sex for several years   Other Topics Concern  . Not on file   Social History Narrative   Review of Systems: Review of Systems  Constitutional: Negative for fever and malaise/fatigue.  Respiratory: Negative for cough and shortness of breath.   Cardiovascular: Negative for chest pain.  Gastrointestinal: Negative for diarrhea, blood in stool and melena.  Skin: Positive for itching (anal itching) and rash (rt side of neck that is comes and goes).  Neurological: Negative for weakness.    Objective:  Physical Exam: There were no vitals filed for this visit. Physical Exam  Constitutional: She appears well-developed and well-nourished. No distress.  HENT:  Head: Normocephalic.  Cardiovascular: Normal rate, regular rhythm and normal heart sounds.   No murmur heard. Pulmonary/Chest: Effort normal. No respiratory distress. She has no wheezes. She has no rales.  Abdominal: Soft. Bowel sounds are normal. She exhibits no distension.  Genitourinary:  Rectum appears nl, negative for hemorrhoids or skin tags, neg for scaling or plaques. Surrounding skin around rectum is darker within normal limits. Good hygiene.    Neurological: She is alert.  Skin: Skin is warm and dry.  Dark patches of skin on b/l flexural surfaces of elbows,  hyperpigmentation beneath both eyes and patches of hyperpigmentation on shoulders and arms. 1-2 bug bites with central scabbing on forearms.    Assessment & Plan:   Please see problem based assessment and plan.

## 2015-02-19 NOTE — Patient Instructions (Addendum)
Apply hydrocortisone 1% to the affected area daily for a maximum of 2 weeks.   You can apply zinc oxide twice a day as needed.   Start taking zyrtec daily for allergies and try to avoid cigarette smoke, animal dander, stress, and dust mites.

## 2015-02-19 NOTE — Assessment & Plan Note (Addendum)
BP Readings from Last 3 Encounters:  02/19/15 151/85  07/22/14 117/70  07/19/14 147/88    Lab Results  Component Value Date   NA 137 05/01/2014   K 4.1 05/01/2014   CREATININE 0.50 05/01/2014    Assessment: Blood pressure control:  near goal Progress toward BP goal:   close to goal Comments: BP 151/85 today and pt reports having DBP in the 90's when she goes to [employer] to check her BP. on norvasc 5 mg at home.   Plan: Medications:  Increased norvasc to 10mg  daily Educational resources provided:   Self management tools provided:   Other plans: f/u in 6 months

## 2015-02-19 NOTE — Assessment & Plan Note (Signed)
Pt has had anal itching x 3-4 months as detailed in HPI. Anal itching is different from her previous episodes of itching when she was tx for enterobius vermicularis. Itching resolved transiently for 1 day when she used her hydrocortisone cream that she applies to her neck eczema. Rectal exam was normal w/o hemorrhoids, fissures, scaling of skin, plaques, or pin worms. She has chronic skin changes consistent with an atopic dermatitis and likely pt is experiencing atopic dermatitis around her skin rectum. Other DDx include hidradenitis supparativa, lichen planus, enterobius vermicularis, and Paget's disease however there were no skin changes to suggest these.   - rx for hydrocortisone 1% cream applied BID to affected area x maximum of 2 weeks and zinc oxide 10% topical cream that she can use BID prn as needed indefinitely - rx for zyrtec daily, pt is opposed to taking multiple medications however counseled she would benefit from this and educated on avoidance of allergy triggers including dust mites, cigarette smoke, stress, and animal dander ( she has a cat).

## 2015-02-19 NOTE — Assessment & Plan Note (Signed)
Pt denies recent panic attacks. She is not on citalopram or ativan for this b/c she does not like to take medications. She states anxiety was due to driving and feeling like she could not control the car. She has therefore stopped driving. Will continue to monitor.

## 2015-02-20 ENCOUNTER — Encounter: Payer: Self-pay | Admitting: Internal Medicine

## 2015-02-25 NOTE — Progress Notes (Signed)
Case discussed with Dr. Truong at time of visit. We reviewed the resident's history and exam and pertinent patient test results. I agree with the assessment, diagnosis, and plan of care documented in the resident's note. 

## 2015-03-04 ENCOUNTER — Other Ambulatory Visit (INDEPENDENT_AMBULATORY_CARE_PROVIDER_SITE_OTHER): Payer: Self-pay

## 2015-03-04 DIAGNOSIS — Z1211 Encounter for screening for malignant neoplasm of colon: Secondary | ICD-10-CM

## 2015-03-04 LAB — POC HEMOCCULT BLD/STL (HOME/3-CARD/SCREEN)
Card #2 Fecal Occult Blod, POC: NEGATIVE
FECAL OCCULT BLD: NEGATIVE
Fecal Occult Blood, POC: NEGATIVE

## 2015-03-04 NOTE — Addendum Note (Signed)
Addended by: Bufford Spikes on: 03/04/2015 10:47 AM   Modules accepted: Orders

## 2015-03-13 ENCOUNTER — Emergency Department (HOSPITAL_COMMUNITY): Payer: Self-pay

## 2015-03-13 ENCOUNTER — Encounter (HOSPITAL_COMMUNITY): Payer: Self-pay | Admitting: Family Medicine

## 2015-03-13 ENCOUNTER — Emergency Department (HOSPITAL_COMMUNITY)
Admission: EM | Admit: 2015-03-13 | Discharge: 2015-03-13 | Disposition: A | Discharge status: Home / Self Care | Payer: Self-pay | Attending: Emergency Medicine | Admitting: Emergency Medicine

## 2015-03-13 DIAGNOSIS — Z8619 Personal history of other infectious and parasitic diseases: Secondary | ICD-10-CM | POA: Insufficient documentation

## 2015-03-13 DIAGNOSIS — R0789 Other chest pain: Secondary | ICD-10-CM | POA: Insufficient documentation

## 2015-03-13 DIAGNOSIS — I10 Essential (primary) hypertension: Secondary | ICD-10-CM | POA: Insufficient documentation

## 2015-03-13 DIAGNOSIS — Z79899 Other long term (current) drug therapy: Secondary | ICD-10-CM | POA: Insufficient documentation

## 2015-03-13 LAB — I-STAT TROPONIN, ED: Troponin i, poc: 0 ng/mL (ref 0.00–0.08)

## 2015-03-13 LAB — BASIC METABOLIC PANEL
ANION GAP: 8 (ref 5–15)
BUN: 14 mg/dL (ref 6–20)
CALCIUM: 9 mg/dL (ref 8.9–10.3)
CO2: 25 mmol/L (ref 22–32)
CREATININE: 0.59 mg/dL (ref 0.44–1.00)
Chloride: 100 mmol/L — ABNORMAL LOW (ref 101–111)
GFR calc Af Amer: >60 mL/min (ref >60)
GFR calc non Af Amer: >60 mL/min (ref >60)
GLUCOSE: 193 mg/dL — AB (ref 65–99)
Potassium: 3.9 mmol/L (ref 3.5–5.1)
Sodium: 133 mmol/L — ABNORMAL LOW (ref 135–145)

## 2015-03-13 LAB — CBC
HEMATOCRIT: 34.6 % — AB (ref 36.0–46.0)
HEMOGLOBIN: 11.1 g/dL — AB (ref 12.0–15.0)
MCH: 24.1 pg — AB (ref 26.0–34.0)
MCHC: 32.1 g/dL (ref 30.0–36.0)
MCV: 75.2 fL — AB (ref 78.0–100.0)
PLATELETS: 406 10*3/uL — AB (ref 150–400)
RBC: 4.6 MIL/uL (ref 3.87–5.11)
RDW: 16.6 % — ABNORMAL HIGH (ref 11.5–15.5)
WBC: 13 10*3/uL — AB (ref 4.0–10.5)

## 2015-03-13 MED ORDER — IBUPROFEN 600 MG PO TABS: 600.0000 mg | ORAL_TABLET | Freq: Four times a day (QID) | ORAL | Status: DC | PRN

## 2015-03-13 MED ORDER — KETOROLAC TROMETHAMINE 30 MG/ML IJ SOLN
30.0000 mg | Freq: Once | INTRAMUSCULAR | Status: AC
  Administered 2015-03-13: 30 mg via INTRAVENOUS
  Filled 2015-03-13: qty 1

## 2015-03-13 NOTE — Discharge Instructions (Signed)
There does not appear to be an emergent cause for your symptoms at this time. Your symptoms are likely due to rib and chest wall pain. Your exam, labs, EKG and chest x-ray are all reassuring. Please follow-up with your doctors as needed for reevaluation. Take your ibuprofen or Tylenol for your discomfort.  Chest Wall Pain Chest wall pain is pain in or around the bones and muscles of your chest. Sometimes, an injury causes this pain. Sometimes, the cause may not be known. This pain may take several weeks or longer to get better. HOME CARE INSTRUCTIONS  Pay attention to any changes in your symptoms. Take these actions to help with your pain:   Rest as told by your health care provider.   Avoid activities that cause pain. These include any activities that use your chest muscles or your abdominal and side muscles to lift heavy items.   If directed, apply ice to the painful area:  Put ice in a plastic bag.  Place a towel between your skin and the bag.  Leave the ice on for 20 minutes, 2-3 times per day.  Take over-the-counter and prescription medicines only as told by your health care provider.  Do not use tobacco products, including cigarettes, chewing tobacco, and e-cigarettes. If you need help quitting, ask your health care provider.  Keep all follow-up visits as told by your health care provider. This is important. SEEK MEDICAL CARE IF:  You have a fever.  Your chest pain becomes worse.  You have new symptoms. SEEK IMMEDIATE MEDICAL CARE IF:  You have nausea or vomiting.  You feel sweaty or light-headed.  You have a cough with phlegm (sputum) or you cough up blood.  You develop shortness of breath.   This information is not intended to replace advice given to you by your health care provider. Make sure you discuss any questions you have with your health care provider.   Document Released: 04/19/2005 Document Revised: 01/08/2015 Document Reviewed: 07/15/2014 Elsevier  Interactive Patient Education [employer]

## 2015-03-13 NOTE — ED Notes (Signed)
Pt is complaining of right side chest pain that started Wednesday. Reports pain is sharp and radiates to her back. Denies shortness of breath but states she is intermittent weak on exertion. Pt reports she thought it was a pulled muscle.

## 2015-03-13 NOTE — ED Provider Notes (Signed)
CSN: [id number]     Arrival date & time 03/13/15  0740 History   First MD Initiated Contact with Patient 03/13/15 0801     Chief Complaint  Patient presents with  . Chest Pain     (Consider location/radiation/quality/duration/timing/severity/associated sxs/prior Treatment) HPI Vicki Cook is a 54 y.o. female history of hypertension who comes in for evaluation of chest pain. Patient reports since last Wednesday, one week ago, she has had constant right sided chest pain that she localizes under her right breast and around to her back. She characterizes the pain as a sharp sensation, worse with movement. She reports her symptoms were exacerbated this morning when she reached down to pick up her cat. States she feels better as she is lying down flat and currently rates her discomfort as 5/10. No other interventions tried to improve symptoms. Denies fevers, chills, shortness of breath, cough, hemoptysis, leg swelling, recent travel or surgeries, OCP use, history of blood clot. She is a nonsmoker with no family history of heart disease. Patient is from [place], but has not been there since 2005.  Past Medical History  Diagnosis Date  . Hypertension   . H/O pinworm infection    History reviewed. No pertinent past surgical history. Family History  Problem Relation Age of Onset  . Heart disease Mother   . Hyperlipidemia Mother   . Hypertension Mother   . Heart disease Father   . Hypertension Father   . Diabetes Father    Social History  Substance Use Topics  . Smoking status: Never Smoker   . Smokeless tobacco: None  . Alcohol Use: No   OB History    No data available     Review of Systems A 10 point review of systems was completed and was negative except for pertinent positives and negatives as mentioned in the history of present illness     Allergies  Asa and Lisinopril  Home Medications   Prior to Admission medications   Medication Sig Start Date End Date Taking?  Authorizing Provider  amLODipine (NORVASC) 10 MG tablet Take 1 tablet (10 mg total) by mouth daily. 02/19/15  Yes Denton Brick, MD  glucosamine-chondroitin 500-400 MG tablet Take 1 tablet by mouth daily.   Yes Historical Provider, MD  Influenza Vac Split Quad (FLUZONE) 0.25 ML injection Inject 0.25 mLs into the muscle once.   Yes Historical Provider, MD  Omega-3 Fatty Acids (FISH OIL PO) Take 1 tablet by mouth daily.   Yes Historical Provider, MD  Cetirizine HCl 10 MG CAPS Take 1 capsule (10 mg total) by mouth daily. Patient not taking: Reported on 03/13/2015 02/19/15   Denton Brick, MD  hydrocortisone cream 1 % Apply to affected area 2 times daily Patient not taking: Reported on 03/13/2015 02/19/15 02/19/16  Denton Brick, MD  ibuprofen (ADVIL,MOTRIN) 600 MG tablet Take 1 tablet (600 mg total) by mouth every 6 (six) hours as needed. 03/13/15   Joycie Peek, PA-C  ZINC OXIDE, TOPICAL, 10 % CREA Apply to affected area BID prn Patient not taking: Reported on 03/13/2015 02/19/15   Denton Brick, MD   BP 150/90 mmHg  Pulse 88  Temp(Src) 98 F (36.7 C) (Oral)  Resp 18  Ht 5' (1.524 m)  Wt 154 lb (69.854 kg)  BMI 30.08 kg/m2  SpO2 99%  LMP 03/10/2015 Physical Exam  Constitutional: She is oriented to person, place, and time. She appears well-developed and well-nourished.  HENT:  Head: Normocephalic and atraumatic.  Mouth/Throat: Oropharynx is clear and moist.  Eyes: Conjunctivae are normal. Pupils are equal, round, and reactive to light. Right eye exhibits no discharge. Left eye exhibits no discharge. No scleral icterus.  Neck: Neck supple.  Cardiovascular: Normal rate, regular rhythm and normal heart sounds.   Pulmonary/Chest: Effort normal and breath sounds normal. No respiratory distress. She has no wheezes. She has no rales.  Tenderness diffusely to palpation throughout intercostal spaces below right breast. No other lesions or deformities noted. No rash. No crepitus or bony  step-offs.  Abdominal: Soft. There is no tenderness.  Musculoskeletal: She exhibits no tenderness.  Neurological: She is alert and oriented to person, place, and time.  Cranial Nerves II-XII grossly intact  Skin: Skin is warm and dry. No rash noted.  Psychiatric: She has a normal mood and affect.  Nursing note and vitals reviewed.   ED Course  Procedures (including critical care time) Labs Review Labs Reviewed  BASIC METABOLIC PANEL - Abnormal; Notable for the following:    Sodium 133 (*)    Chloride 100 (*)    Glucose, Bld 193 (*)    All other components within normal limits  CBC - Abnormal; Notable for the following:    WBC 13.0 (*)    Hemoglobin 11.1 (*)    HCT 34.6 (*)    MCV 75.2 (*)    MCH 24.1 (*)    RDW 16.6 (*)    Platelets 406 (*)    All other components within normal limits  I-STAT TROPOININ, ED    Imaging Review Dg Chest 2 View  03/13/2015  CLINICAL DATA:  Right-sided chest pain, acute EXAM: CHEST  2 VIEW COMPARISON:  None. FINDINGS: Lungs are clear. Heart size and pulmonary vascularity are normal. No adenopathy. No pneumothorax. No bone lesions. IMPRESSION: No edema or consolidation. Electronically Signed   By: Bretta Bang III M.D.   On: 03/13/2015 08:40   I have personally reviewed and evaluated these images and lab results as part of my medical decision-making.   EKG Interpretation   Date/Time:  Thursday March 13 2015 07:58:43 EST Ventricular Rate:  98 PR Interval:  154 QRS Duration: 77 QT Interval:  351 QTC Calculation: 448 R Axis:   10 Text Interpretation:  Sinus rhythm Borderline T wave abnormalities  Baseline wander in lead(s) II III aVF Confirmed by Lincoln Brigham [phone]) on  03/13/2015 8:16:40 AM     Meds given in ED:  Medications  ketorolac (TORADOL) 30 MG/ML injection 30 mg (30 mg Intravenous Given 03/13/15 0922)    Discharge Medication List as of 03/13/2015  9:51 AM    START taking these medications   Details  ibuprofen  (ADVIL,MOTRIN) 600 MG tablet Take 1 tablet (600 mg total) by mouth every 6 (six) hours as needed., Starting 03/13/2015, Until Discontinued, Print       Filed Vitals:   03/13/15 0755 03/13/15 1012  BP: 160/92 150/90  Pulse: 99 88  Temp: 98 F (36.7 C)   TempSrc: Oral   Resp: 17 18  Height: 5' (1.524 m)   Weight: 154 lb (69.854 kg)   SpO2: 100% 99%    MDM  Vitals stable - WNL -afebrile Pt resting comfortably in ED.  PE--Lung exam benign. Cardiac auscultation reveals no murmurs rubs or gallops. Grossly Benign Physical Exam Labwork: Troponin negative. EKG reassuring. Labs otherwise noncontributory Imaging: CXR shows no acute cardiopulmonary pathology  DDX: Patient with chest discomfort likely due to MSK. Symptoms have been ongoing and persistence since 1  week ago last Wednesday. Worse with certain movements and exacerbated when trying to pick up cat. Negative troponin today. EKG is reassuring. Patient has tender along the intercostal spaces under right breast. This is most likely a costochondritis. Clinical picture and exam today not consistent with ACS/dissection. Heart score 2. No evidence of spontaneous pneumothorax, esophageal rupture or other mediastinitis. Doubt PE. Well's score 0 No evidence of myocarditis, endocarditis, pericarditis.   I discussed all relevant lab findings and imaging results with pt and they verbalized understanding. Discussed f/u with PCP within 48 hrs and return precautions, pt very amenable to plan. Overall, patient appears well, nontoxic, hemodynamically stable and appropriate for discharge.  Final diagnoses:  Right-sided chest wall pain        Joycie Peek, PA-C 03/14/15 0730  Tilden Fossa, MD 03/14/15 1051

## 2015-03-18 ENCOUNTER — Encounter: Payer: Self-pay | Admitting: Student

## 2015-05-04 DIAGNOSIS — M069 Rheumatoid arthritis, unspecified: Secondary | ICD-10-CM | POA: Insufficient documentation

## 2015-05-04 HISTORY — DX: Rheumatoid arthritis, unspecified: M06.9

## 2015-05-23 ENCOUNTER — Telehealth: Payer: Self-pay | Admitting: Internal Medicine

## 2015-05-23 NOTE — Telephone Encounter (Signed)
WANTS TO HAVE HER MEDICATIONS MOVED FROM [facility], TO [facility]

## 2015-05-27 ENCOUNTER — Other Ambulatory Visit: Payer: Self-pay | Admitting: Internal Medicine

## 2015-05-27 MED FILL — AMLODIPINE BESYLATE 10 MG T: 10 | 30 days supply | Qty: 30 | Fill #0

## 2015-05-27 NOTE — Telephone Encounter (Signed)
Patient called on 05/23/2015 to move her medications from [facility] to [facility] and would like to know why it has not be done yet.

## 2015-05-27 NOTE — Telephone Encounter (Signed)
Spoke with Lupita Leash at [facility].  She will call [facility] to get rx info.   Called pt to inform she should hear from [facility] pharmacy soon about her refills.

## 2015-06-16 ENCOUNTER — Ambulatory Visit: Payer: Self-pay

## 2015-06-19 ENCOUNTER — Ambulatory Visit: Payer: Self-pay

## 2015-06-26 ENCOUNTER — Telehealth: Payer: Self-pay | Admitting: Internal Medicine

## 2015-06-26 NOTE — Telephone Encounter (Signed)
APPT REMINDER CALL, LMTCB IF SHE NEEDS TO CANCEL °

## 2015-06-27 ENCOUNTER — Ambulatory Visit: Payer: Self-pay

## 2015-07-02 MED FILL — AMLODIPINE BESYLATE 10 MG T: 10 | 30 days supply | Qty: 30 | Fill #1

## 2015-07-08 ENCOUNTER — Telehealth: Payer: Self-pay | Admitting: Internal Medicine

## 2015-07-08 NOTE — Telephone Encounter (Signed)
APPT. REMINDER CALL, LMTCB °

## 2015-07-09 ENCOUNTER — Ambulatory Visit: Payer: Self-pay

## 2015-07-15 ENCOUNTER — Encounter: Payer: Self-pay | Admitting: Family Medicine

## 2015-07-15 ENCOUNTER — Other Ambulatory Visit: Payer: Self-pay | Admitting: Family Medicine

## 2015-07-15 ENCOUNTER — Ambulatory Visit (INDEPENDENT_AMBULATORY_CARE_PROVIDER_SITE_OTHER): Payer: Self-pay | Admitting: Family Medicine

## 2015-07-15 VITALS — BP 138/90 | HR 99 | Ht 60.0 in | Wt 153.0 lb

## 2015-07-15 DIAGNOSIS — M25562 Pain in left knee: Secondary | ICD-10-CM

## 2015-07-15 DIAGNOSIS — M25462 Effusion, left knee: Secondary | ICD-10-CM

## 2015-07-15 DIAGNOSIS — M25561 Pain in right knee: Secondary | ICD-10-CM

## 2015-07-15 MED ORDER — METHYLPREDNISOLONE ACETATE 40 MG/ML IJ SUSP
40.0000 mg | Freq: Once | INTRAMUSCULAR | Status: AC
  Administered 2015-07-15: 40 mg via INTRA_ARTICULAR

## 2015-07-15 NOTE — Patient Instructions (Signed)
Your baker's cysts appear to have resolved. Your primary issue now is your arthritis and swelling as a result of this. These are the different medications you can take for this: Tylenol 500mg  1-2 tabs three times a day for pain. Aleve 1-2 tabs twice a day with food Glucosamine sulfate 750mg  twice a day is a supplement that may help. Capsaicin, aspercreme, or biofreeze topically up to four times a day may also help with pain. Cortisone injections are an option - you were given these today. It's important that you continue to stay active. Straight leg raises, knee extensions 3 sets of 10 once a day (add ankle weight if these become too easy). Start physical therapy to strengthen muscles around the joint that hurts to take pressure off of the joint itself. Shoe inserts with good arch support may be helpful. Walker or cane if needed. Heat or ice 15 minutes at a time 3-4 times a day as needed to help with pain. Water aerobics and cycling with low resistance are the best two types of exercise for arthritis. Follow up with me in 5-6 weeks for reevaluation.

## 2015-07-16 LAB — SYNOVIAL CELL COUNT + DIFF, W/ CRYSTALS
Crystals, Fluid: NONE SEEN
Eosinophils-Synovial: 0 % (ref 0–1)
LYMPHOCYTES-SYNOVIAL FLD: 25 % — AB (ref 0–20)
Monocyte/Macrophage: 12 % — ABNORMAL LOW (ref 50–90)
Neutrophil, Synovial: 63 % — ABNORMAL HIGH (ref 0–25)
WBC, Synovial: 18450 cu mm — ABNORMAL HIGH (ref 0–200)

## 2015-07-17 NOTE — Assessment & Plan Note (Signed)
2/2 DJD with effusions.  Discussed tylenol, nsaids, glucosamine, topical medications.  Aspiration and injections performed today.  Synovial fluid more cloudy than expected so sent to the lab for analysis.  Shown home exercises to do daily and will start physical therapy.  Discussed icing, arch support.  F/u in 5-6 weeks for reevaluation.    After informed written consent patient was lying supine on exam table.  Right knee was prepped with alcohol swab.  Utilizing superolateral approach, 3 mL of marcaine was used for local anesthesia.  Then using an 18g needle on 60cc syringe, 8 mL of blood-tinged straw-colored fluid was aspirated from right knee.  Knee was then injected with 3:1 marcaine:depomedrol.  Patient tolerated procedure well without immediate complications  After informed written consent patient was lying supine on exam table.  Left knee was prepped with alcohol swab.  Utilizing superolateral approach, 3 mL of marcaine was used for local anesthesia.  Then using an 18g needle on 60cc syringe, 26 mL of cloudy straw-colored fluid was aspirated from left knee.  Knee was then injected with 3:1 marcaine:depomedrol.  Patient tolerated procedure well without immediate complications

## 2015-07-17 NOTE — Progress Notes (Addendum)
Patient ID: KENNETHA PEARMAN, female   DOB: 09/03/1960, 55 y.o.   MRN: [id number]  PCP: Gara Kroner, MD  Subjective:   HPI: Patient is a 55 y.o. female here for bilateral knee pain.  11/06/13: Patient has been seen previously in [place] office for issues with bilateral leg and knee swelling. She's had this for over 8 months now. No known injury. Causes pain, swelling, tightness in backs of knees into calf muscles. 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  No meniscal tears, other pathology of note. She had intraarticular injection of left knee which helped but was painful for first 2 weeks - pain has worsened over past month. Tried to have right bakers cyst aspirated and injection - only 1-2 mL thick cystic fluid obtained before injection. Left one is worse than right currently. Only has cone coverage at the moment. Tried ibuprofen 600 three times a day. Worse when on her feet or walking a lot.  03/18/14: Patient reports she improved some following last injection into left knee. Pain currently 4/10 and radiates to back of knee. Bothers her every day. Worse with prolonged sitting then going to get up. Has been wrapping with ACE bandage which has helped. No catching, locking, giving out.  07/15/15: Patient returns with left worse than right knee pain. States pain is up to 9/10 on left, sharp. Worse with stairs. Feels prior injections did not help. Pain radiates to posterior knees. States cannot work because pain is so bad it impairs her ability to walk. No skin changes, fever.  Past Medical History  Diagnosis Date  . Hypertension   . H/O pinworm infection     Current Outpatient Prescriptions on File Prior to Visit  Medication Sig Dispense Refill  . amLODipine (NORVASC) 10 MG tablet Take 1 tablet (10 mg total) by mouth daily. 30 tablet 11  . Cetirizine  HCl 10 MG CAPS Take 1 capsule (10 mg total) by mouth daily. (Patient not taking: Reported on 03/13/2015) 30 capsule 11  . glucosamine-chondroitin 500-400 MG tablet Take 1 tablet by mouth daily.    . Influenza Vac Split Quad (FLUZONE) 0.25 ML injection Inject 0.25 mLs into the muscle once.    . Omega-3 Fatty Acids (FISH OIL PO) Take 1 tablet by mouth daily.    Marland Kitchen ZINC OXIDE, TOPICAL, 10 % CREA Apply to affected area BID prn (Patient not taking: Reported on 03/13/2015) 50 g 0   No current facility-administered medications on file prior to visit.    No past surgical history on file.  Allergies  Allergen Reactions  . Asa [Aspirin] Nausea And Vomiting and Other (See Comments)    Burning in stomach   . Lisinopril Other (See Comments)    Had pain in her knees     Social History   Social History  . Marital Status: Married    Spouse Name: N/A  . Number of Children: N/A  . Years of Education: N/A   Occupational History  . Not on file.   Social History Main Topics  . Smoking status: Never Smoker   . Smokeless tobacco: Not on file  . Alcohol Use: No  . Drug Use: No  . Sexual Activity: Not on file     Comment: has not had sex for several years   Other Topics Concern  . Not on file   Social History Narrative  Family History  Problem Relation Age of Onset  . Heart disease Mother   . Hyperlipidemia Mother   . Hypertension Mother   . Heart disease Father   . Hypertension Father   . Diabetes Father     BP 138/90 mmHg  Pulse 99  Ht 5' (1.524 m)  Wt 153 lb (69.4 kg)  BMI 29.88 kg/m2  Review of Systems: See HPI above.    Objective:  Physical Exam:  Gen: NAD  Left knee: Mod effusion.  No palpable bakers cyst posterior knee.  No bruising, other deformity. TTP medial joint line, post patellar facets and suprapatellar pouch. ROM 0 - 120 degrees. Negative ant/post drawers. Negative valgus/varus testing. Negative lachmanns. Negative mcmurrays, apleys, patellar  apprehension. NV intact distally.  Right knee: Small effusion.  No palpable bakers cyst posterior knee.  No bruising, other deformity. TTP medial joint line. ROM 0 - 120 degrees. Negative ant/post drawers. Negative valgus/varus testing. Negative lachmanns. Negative mcmurrays, apleys, patellar apprehension. NV intact distally.  MSK u/s:  No visible bakers cysts now either knee though effusions confirmed.    Assessment & Plan:  1. Bilateral knee pain - 2/2 DJD with effusions.  Discussed tylenol, nsaids, glucosamine, topical medications.  Aspiration and injections performed today.  Synovial fluid more cloudy than expected so sent to the lab for analysis.  Shown home exercises to do daily and will start physical therapy.  Discussed icing, arch support.  F/u in 5-6 weeks for reevaluation.    After informed written consent patient was lying supine on exam table.  Right knee was prepped with alcohol swab.  Utilizing superolateral approach, 3 mL of marcaine was used for local anesthesia.  Then using an 18g needle on 60cc syringe, 8 mL of blood-tinged straw-colored fluid was aspirated from right knee.  Knee was then injected with 3:1 marcaine:depomedrol.  Patient tolerated procedure well without immediate complications  After informed written consent patient was lying supine on exam table.  Left knee was prepped with alcohol swab.  Utilizing superolateral approach, 3 mL of marcaine was used for local anesthesia.  Then using an 18g needle on 60cc syringe, 26 mL of cloudy straw-colored fluid was aspirated from left knee.  Knee was then injected with 3:1 marcaine:depomedrol.  Patient tolerated procedure well without immediate complications  Addendum:  Synovial fluid results obtained, discussed with patient.  She has 18450 WBCs with 63% neutrophils, no crystals.  Culture with no growth.  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  She reports she has improved since aspiration and  injections.

## 2015-07-19 LAB — BODY FLUID CULTURE
GRAM STAIN: NONE SEEN
ORGANISM ID, BACTERIA: NO GROWTH

## 2015-07-28 NOTE — Addendum Note (Signed)
Addended by: Lenda Kelp on: 07/28/2015 02:33 PM   Modules accepted: Kipp Brood

## 2015-07-29 ENCOUNTER — Ambulatory Visit: Payer: Self-pay | Attending: Family Medicine | Admitting: Physical Therapy

## 2015-07-29 DIAGNOSIS — M25662 Stiffness of left knee, not elsewhere classified: Secondary | ICD-10-CM | POA: Insufficient documentation

## 2015-07-29 DIAGNOSIS — R262 Difficulty in walking, not elsewhere classified: Secondary | ICD-10-CM | POA: Insufficient documentation

## 2015-07-29 DIAGNOSIS — M25562 Pain in left knee: Secondary | ICD-10-CM | POA: Insufficient documentation

## 2015-07-29 DIAGNOSIS — M25661 Stiffness of right knee, not elsewhere classified: Secondary | ICD-10-CM | POA: Insufficient documentation

## 2015-07-29 DIAGNOSIS — M25561 Pain in right knee: Secondary | ICD-10-CM | POA: Insufficient documentation

## 2015-07-29 NOTE — Therapy (Signed)
[facility] Outpatient Rehabilitation [facility] [address]  [address] [place], [place], [address] Phone: [phone]   Fax:  [phone]  Physical Therapy Evaluation  Patient Details  Name: CARISSA DUGO MRN: [id number] Date of Birth: [birth year] Referring Provider: Pearletha Forge  Encounter Date: 07/29/2015      PT End of Session - 07/29/15 1150    Visit Number 1   Date for PT Re-Evaluation 09/28/15   PT Start Time 1100   PT Stop Time 1152   PT Time Calculation (min) 52 min   Activity Tolerance Patient limited by pain   Behavior During Therapy [facility] for tasks assessed/performed      Past Medical History  Diagnosis Date  . Hypertension   . H/O pinworm infection     History reviewed. No pertinent past surgical history.  There were no vitals filed for this visit.  Visit Diagnosis:  Pain in left knee - Plan: PT plan of care cert/re-cert  Pain in right knee - Plan: PT plan of care cert/re-cert  Difficulty in walking, not elsewhere classified - Plan: PT plan of care cert/re-cert  Stiffness of left knee, not elsewhere classified - Plan: PT plan of care cert/re-cert  Stiffness of right knee, not elsewhere classified - Plan: PT plan of care cert/re-cert      Subjective Assessment - 07/29/15 1103    Subjective Patient reports that she has had bilateral knee pain for about two years.  Reports that she has had Baker's cyst diagnosed in the past.  She reports that recently she could not go up and down the stairs.  She reports about 2 weeks ago she had fluid drained from both knees   Limitations Standing;Walking;House hold activities   Patient Stated Goals have less pain   Currently in Pain? Yes   Pain Score 2    Pain Location Knee   Pain Orientation Right;Left   Pain Descriptors / Indicators Aching;Sore   Pain Type Chronic pain   Pain Onset More than a month ago   Pain Frequency Constant   Aggravating Factors  reports pain iwth stairs up to 8-9/10  c/o  worse pain in the left knee and a feeling of "heavy"   Pain Relieving Factors rest helps   Effect of Pain on Daily Activities difficulty walking and doing stairs            [facility] PT Assessment - 07/29/15 0001    Assessment   Medical Diagnosis bilateral knee pain   Referring Provider [place]   Onset Date/Surgical Date 07/15/15   Prior Therapy no   Precautions   Precautions None   Balance Screen   Has the patient fallen in the past 6 months No   Has the patient had a decrease in activity level because of a fear of falling?  No   Is the patient reluctant to leave their home because of a fear of falling?  No   Home Environment   Additional Comments has stairs at home, does the housework, was doing some yardwork but reports that she no longer can do it due to the knee pain   Prior Function   Level of Independence Independent   Vocation Unemployed   Leisure has not exercised in about 2 years   AROM   AROM Assessment Site Knee   Right/Left Knee Right;Left   Right Knee Extension 15   Right Knee Flexion 115   Left Knee Extension 20   Left Knee Flexion 90  Strength   Strength Assessment Site Knee   Right/Left Knee Right;Left   Right Knee Flexion 4/5   Right Knee Extension 4/5   Left Knee Flexion 3+/5  with pain   Left Knee Extension 3+/5  with pain   Flexibility   Soft Tissue Assessment /Muscle Length --  she is very tight in the calves, ITB, HS, hip flexor /quad   Palpation   Palpation comment legs feel very tight, non tender on the right leg, left patellar tendon is tender to palpation   Ambulation/Gait   Gait Comments no assistive device used, antalgic on the left more than the right, her knees remain in a flexed position.  To do stairs she does one at a time, up with the right first and down with the left .                   [facility] Adult PT Treatment/Exercise - 07/29/15 0001    Modalities   Modalities Iontophoresis   Iontophoresis   Type of Iontophoresis  Dexamethasone   Location left patellar tendon area   Dose 21mA   Time 4 hour patch                PT Education - 07/29/15 1149    Education provided Yes   Education Details Gave HEP about flexibility for HS, piriformis and ITB   Person(s) Educated Patient   Methods Explanation;Demonstration;Handout   Comprehension Verbalized understanding;Returned demonstration;Need further instruction          PT Short Term Goals - 07/29/15 1153    PT SHORT TERM GOAL #1   Title independent with initial HEP   Time 2   Period Weeks   Status New           PT Long Term Goals - 07/29/15 1153    PT LONG TERM GOAL #1   Title report decreased pain 50%   Time 8   Period Weeks   Status New   PT LONG TERM GOAL #2   Title able to ascend and descend stairs step over step   Time 8   Period Weeks   Status New   PT LONG TERM GOAL #3   Title increase AROM of the left knee to 10-110 degrees flexion   Time 8   Period Weeks   Status New   PT LONG TERM GOAL #4   Title increase strength of the left knee to 4/5   Time 8   Period Weeks   Status New               Plan - 07/29/15 1150    Clinical Impression Statement Patient reports bilateral knee pain left > right for about two years, she has had fluid drained from the knees.  She describes a very stressful life the past few years that may contribute to some of the mm tightness.  She is very tight in the HS, calves, quads, ITB and piriformis mms.  Pain is vague and not located in any specific area except some tenderness in the left patellar tendon.  She has to go up and down stairs one at a time due to left knee pain.   Pt will benefit from skilled therapeutic intervention in order to improve on the following deficits Abnormal gait;Decreased activity tolerance;Decreased mobility;Decreased range of motion;Difficulty walking;Decreased strength;Impaired flexibility;Pain;Increased muscle spasms   Rehab Potential Good   PT Frequency 2x /  week   PT Duration 8 weeks   PT  Treatment/Interventions ADLs/Self Care Home Management;Cryotherapy;Electrical Stimulation;Iontophoresis 4mg /ml Dexamethasone;Moist Heat;Therapeutic exercise;Therapeutic activities;Functional mobility training;Stair training;Gait training;Ultrasound;Balance training;Neuromuscular re-education;Patient/family education;Manual techniques;Taping   PT Next Visit Plan Assure that she is doing the HEP, add exercises as tolerated, she had some concerns about copay but it seems that she worked this out with and Agree with Plan of Care Patient          G-Codes - 2015/08/23 1156    Functional Assessment Tool Used foto 58% limitation   Functional Limitation Mobility: Walking and moving around   Mobility: Walking and Moving Around Current Status [phone]) At least 40 percent but less than 60 percent impaired, limited or restricted   Mobility: Walking and Moving Around Goal Status [phone]) At least 40 percent but less than 60 percent impaired, limited or restricted       Problem List Patient Active Problem List   Diagnosis Date Noted  . Anal pruritus 02/19/2015  . Panic attack 07/19/2014  . Microcytic anemia 05/02/2014  . Leucocytosis 05/02/2014  . Pruritic rash 04/24/2014  . Bilateral knee pain 03/20/2014  . Healthcare maintenance 02/25/2014  . 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 HTN (hypertension) 02/07/2014  . Baker's cyst of knee 07/20/2013    07/22/2013., PT 2015-08-23, 11:57 AM  [facility] [address]  [address] [place], [place], [place] Phone: [phone]   Fax:  [phone]  Name: MARCINE GADWAY MRN: Gretchen Portela Date of Birth: Jun 15, 1960

## 2015-07-30 ENCOUNTER — Ambulatory Visit: Payer: Self-pay

## 2015-07-30 DIAGNOSIS — M25562 Pain in left knee: Secondary | ICD-10-CM

## 2015-07-30 DIAGNOSIS — M25662 Stiffness of left knee, not elsewhere classified: Secondary | ICD-10-CM

## 2015-07-30 DIAGNOSIS — M25661 Stiffness of right knee, not elsewhere classified: Secondary | ICD-10-CM

## 2015-07-30 DIAGNOSIS — M25561 Pain in right knee: Secondary | ICD-10-CM

## 2015-07-30 DIAGNOSIS — R262 Difficulty in walking, not elsewhere classified: Secondary | ICD-10-CM

## 2015-07-30 NOTE — Therapy (Signed)
[facility] Outpatient Rehabilitation [facility] [address]  [address] [place], [place], [address] Phone: [phone]   Fax:  [phone]  Physical Therapy Treatment  Patient Details  Name: Vicki Cook MRN: [id number] Date of Birth: Mar 07, 1961 Referring Provider: Pearletha Forge  Encounter Date: 07/30/2015      PT End of Session - 07/30/15 0922    Visit Number 2   Number of Visits 16   Date for PT Re-Evaluation 09/28/15   PT Start Time 0849   PT Stop Time 0944   PT Time Calculation (min) 55 min   Activity Tolerance Patient limited by pain   Behavior During Therapy [facility] for tasks assessed/performed      Past Medical History  Diagnosis Date  . Hypertension   . H/O pinworm infection     History reviewed. No pertinent past surgical history.  There were no vitals filed for this visit.  Visit Diagnosis:  Pain in right knee  Stiffness of left knee, not elsewhere classified  Difficulty in walking, not elsewhere classified  Pain in left knee  Stiffness of right knee, not elsewhere classified      Subjective Assessment - 07/30/15 1812    Subjective Pt. reports 7/10 L knee pain, 4/10 R knee pain.  No other pain or complaints reported.     Patient Stated Goals have less pain   Currently in Pain? Yes   Pain Score 7    Pain Location Knee   Pain Orientation Left   Pain Descriptors / Indicators Aching;Sore   Pain Type Chronic pain   Pain Radiating Towards n/a   Pain Onset More than a month ago   Pain Frequency Constant   Aggravating Factors  stairs   Pain Relieving Factors rest   Multiple Pain Sites Yes       Today's Treatment  Therex:  NuStep: 5 min, level 2  B HS, piri, SKTC, x 30 min  B RF stretch in mod thomas x 1 min  Hooklying B hip abd / ER with green TB x 10 reps Bridging x 10 reps  B HS stretch with strap x 30 sec each   Vasoneumatic compression  HEP Review: would benefit from further review: demo'd / verbalized poor  understanding of initial HEP)            PT Education - 07/30/15 1829    Education provided Yes   Education Details reviewed HEP: B HS, piriformis, and ITB stretches   Person(s) Educated Patient   Methods Handout;Explanation;Demonstration;Verbal cues;Tactile cues  pt. brought in HHPT handout.   Comprehension Verbalized understanding;Need further instruction;Tactile cues required;Verbal cues required;Returned demonstration          PT Short Term Goals - 07/30/15 1816    PT SHORT TERM GOAL #1   Title independent with initial HEP  07/30/15: initial HEP reviewed today with pt. however pt. would benefit from further review for better understanding.    Time 2   Period Weeks   Status On-going           PT Long Term Goals - 07/30/15 1817    PT LONG TERM GOAL #1   Title report decreased pain 50%   Time 8   Period Weeks   Status On-going   PT LONG TERM GOAL #2   Title able to ascend and descend stairs step over step   Time 8   Period Weeks   Status On-going   PT LONG TERM GOAL #3   Title  increase AROM of the left knee to 10-110 degrees flexion   Time 8   Period Weeks   Status On-going   PT LONG TERM GOAL #4   Title increase strength of the left knee to 4/5   Time 8   Period Weeks   Status On-going               Plan - 07/30/15 1817    Clinical Impression Statement Pt. 7/10 L knee pain, 4/10 R knee pain today initially however reported some decrease in L knee pain following vasoneumatic compression.  Pt. B LE tightness continues with pain in B knee flexion positions.  Today's treatrment focused on B hip / LE stretching and strengthening activity which pt. tolerated well.  Pt. was pleased following ionto application on L knee last treatment, and would benefit from further application of ionto to L knee for decreased inflammation.     PT Next Visit Plan continued HEP review; add exercises as tolerated, B LE stretching, iontophoresis.           G-Codes -  08/14/2015 1156    Functional Assessment Tool Used foto 58% limitation   Functional Limitation Mobility: Walking and moving around   Mobility: Walking and Moving Around Current Status [phone]) At least 40 percent but less than 60 percent impaired, limited or restricted   Mobility: Walking and Moving Around Goal Status [phone]) At least 40 percent but less than 60 percent impaired, limited or restricted      Problem List Patient Active Problem List   Diagnosis Date Noted  . Anal pruritus 02/19/2015  . Panic attack 07/19/2014  . Microcytic anemia 05/02/2014  . Leucocytosis 05/02/2014  . Pruritic rash 04/24/2014  . Bilateral knee pain 03/20/2014  . Healthcare maintenance 02/25/2014  . 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 HTN (hypertension) 02/07/2014  . Baker's cyst of knee 07/20/2013    Kermit Balo, PTA 07/30/2015, 6:31 PM  [facility] [address]  [address] [place], [place], [address] Phone: [phone]   Fax:  [phone]  Name: RASHAUNA TEP MRN: [id number] Date of Birth: 08/25/1960

## 2015-08-01 ENCOUNTER — Encounter: Payer: Self-pay | Admitting: Family Medicine

## 2015-08-04 ENCOUNTER — Ambulatory Visit: Payer: No Typology Code available for payment source | Attending: Family Medicine

## 2015-08-04 ENCOUNTER — Encounter: Payer: Self-pay | Admitting: Rehabilitation

## 2015-08-04 DIAGNOSIS — M25661 Stiffness of right knee, not elsewhere classified: Secondary | ICD-10-CM

## 2015-08-04 DIAGNOSIS — M25662 Stiffness of left knee, not elsewhere classified: Secondary | ICD-10-CM | POA: Insufficient documentation

## 2015-08-04 DIAGNOSIS — R262 Difficulty in walking, not elsewhere classified: Secondary | ICD-10-CM

## 2015-08-04 DIAGNOSIS — M25562 Pain in left knee: Secondary | ICD-10-CM

## 2015-08-04 DIAGNOSIS — M25561 Pain in right knee: Secondary | ICD-10-CM

## 2015-08-04 NOTE — Therapy (Signed)
[facility] Outpatient Rehabilitation [facility] [address]  [address] [place], [place], [address] Phone: [phone]   Fax:  [phone]  Physical Therapy Treatment  Patient Details  Name: Vicki Cook MRN: [id number] Date of Birth: 1961/02/03 Referring Provider: Pearletha Forge  Encounter Date: 08/04/2015      PT End of Session - 08/04/15 1459    Visit Number 3   Number of Visits 16   Date for PT Re-Evaluation 09/28/15   PT Start Time 1452   PT Stop Time 1535   PT Time Calculation (min) 43 min   Activity Tolerance Patient tolerated treatment well   Behavior During Therapy [facility] for tasks assessed/performed      Past Medical History  Diagnosis Date  . Hypertension   . H/O pinworm infection     No past surgical history on file.  There were no vitals filed for this visit.  Visit Diagnosis:  Pain in right knee  Difficulty in walking, not elsewhere classified  Pain in left knee  Stiffness of right knee, not elsewhere classified  Stiffness of left knee, not elsewhere classified      Subjective Assessment - 08/04/15 1838    Subjective Pt. reports 4/10 L kne pain today, no R knee pain currently.  No other pain or complaints reported.     Currently in Pain? Yes   Pain Score 4    Pain Location Knee   Pain Orientation Left   Pain Descriptors / Indicators Aching;Sore   Pain Type Chronic pain   Pain Radiating Towards n/a   Pain Onset More than a month ago   Pain Frequency Constant   Aggravating Factors  stairs    Pain Relieving Factors rest   Multiple Pain Sites No     Today's treatment:   Therex: NuStep: 4 min, level 4  B HS stretch with strap x 30 sec each  B HS, piri, SKTC, x 30 min  B RF stretch in mod thomas x 3 min with RF strumming Bridging x 10 reps  Bridging with hip isometric / ER with blue TB around knees 3 x 5 reps each side B fitter leg press knee extension x 10 reps (1 blue / 1 black)  Vasoneumatic device: medium  compression, 15 min, L LE elevated, coldest temp.         PT Short Term Goals - 07/30/15 1816    PT SHORT TERM GOAL #1   Title independent with initial HEP  07/30/15: initial HEP reviewed today with pt. however pt. would benefit from further review for better understanding.    Time 2   Period Weeks   Status On-going           PT Long Term Goals - 07/30/15 1817    PT LONG TERM GOAL #1   Title report decreased pain 50%   Time 8   Period Weeks   Status On-going   PT LONG TERM GOAL #2   Title able to ascend and descend stairs step over step   Time 8   Period Weeks   Status On-going   PT LONG TERM GOAL #3   Title increase AROM of the left knee to 10-110 degrees flexion   Time 8   Period Weeks   Status On-going   PT LONG TERM GOAL #4   Title increase strength of the left knee to 4/5   Time 8   Period Weeks   Status On-going  Plan - 08/04/15 1500    Clinical Impression Statement Pt. initial L knee pain lower today with R knee pain completely absent just tight.  Pt. tolerated all hip / LE strengthening well; therex in supine still secondary to pt. pain tolerance.     PT Next Visit Plan continued HEP review; add exercises as tolerated, B LE stretching, iontophoresis.         Problem List Patient Active Problem List   Diagnosis Date Noted  . Anal pruritus 02/19/2015  . Panic attack 07/19/2014  . Microcytic anemia 05/02/2014  . Leucocytosis 05/02/2014  . Pruritic rash 04/24/2014  . Bilateral knee pain 03/20/2014  . Healthcare maintenance 02/25/2014  . 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 HTN (hypertension) 02/07/2014  . Baker's cyst of knee 07/20/2013    Kermit Balo, PTA 08/04/2015, 6:45 PM  [facility] [address]  [address] [place], [place], [address] Phone: [phone]   Fax:  [phone]  Name: Vicki Cook MRN: [id number] Date of Birth: 20-May-1960

## 2015-08-05 ENCOUNTER — Ambulatory Visit: Payer: No Typology Code available for payment source | Admitting: Physical Therapy

## 2015-08-05 ENCOUNTER — Encounter: Payer: Self-pay | Admitting: Physical Therapy

## 2015-08-05 DIAGNOSIS — R262 Difficulty in walking, not elsewhere classified: Secondary | ICD-10-CM

## 2015-08-05 DIAGNOSIS — M25562 Pain in left knee: Secondary | ICD-10-CM

## 2015-08-05 DIAGNOSIS — M25561 Pain in right knee: Secondary | ICD-10-CM

## 2015-08-05 DIAGNOSIS — M25661 Stiffness of right knee, not elsewhere classified: Secondary | ICD-10-CM

## 2015-08-05 DIAGNOSIS — M25662 Stiffness of left knee, not elsewhere classified: Secondary | ICD-10-CM

## 2015-08-05 NOTE — Patient Instructions (Signed)
Outer Hip Stretch: Reclined IT Band Stretch (Strap)    Strap around opposite foot, pull across only as far as possible with shoulders on mat. Hold for _30-45___ secs. Repeat __2__ times each leg.  Copyright  VHI. All rights reserved.

## 2015-08-05 NOTE — Therapy (Signed)
[facility] Outpatient Rehabilitation [facility] [address]  [address] [place], [place], [address] Phone: [phone]   Fax:  [phone]  Physical Therapy Treatment  Patient Details  Name: Vicki Cook MRN: [id number] Date of Birth: 05/07/1960 Referring Provider: Pearletha Forge  Encounter Date: 08/05/2015      PT End of Session - 08/05/15 1013    Visit Number 4   Number of Visits 16   Date for PT Re-Evaluation 09/28/15   PT Start Time 1013   PT Stop Time 1110   PT Time Calculation (min) 57 min   Activity Tolerance Patient tolerated treatment well      Past Medical History  Diagnosis Date  . Hypertension   . H/O pinworm infection     History reviewed. No pertinent past surgical history.  There were no vitals filed for this visit.  Visit Diagnosis:  Pain in right knee  Difficulty in walking, not elsewhere classified  Pain in left knee  Stiffness of right knee, not elsewhere classified  Stiffness of left knee, not elsewhere classified      Subjective Assessment - 08/05/15 1015    Subjective Pt reports she is getting better, no pain at rest, only when she is doing things,    Patient Stated Goals have less pain   Currently in Pain? No/denies  at rest                         [facility] Adult PT Treatment/Exercise - 08/05/15 0001    Exercises   Exercises Knee/Hip   Knee/Hip Exercises: Stretches   Passive Hamstring Stretch Both;3 reps;30 seconds   Quad Stretch Both;2 reps  45 sec prone with strap   ITB Stretch Both;3 reps;30 seconds  cross body with strap   Knee/Hip Exercises: Aerobic   Nustep L4x5'   Knee/Hip Exercises: Standing   Forward Step Up Both;2 sets;10 reps;Step Height: 6"  VC for form   Knee/Hip Exercises: Supine   Bridges Strengthening;Both;3 sets;10 reps  with feet on ball   Other Supine Knee/Hip Exercises 10 reps leg lengtheners each side   Knee/Hip Exercises: Sidelying   Hip ABduction Strengthening;Both;2  sets;15 reps   Knee/Hip Exercises: Prone   Other Prone Exercises 20 reps quad sets with 5 sec holds.    Modalities   Modalities Cryotherapy;Vasopneumatic   Cryotherapy   Number Minutes Cryotherapy 15 Minutes   Cryotherapy Location Knee  Rt   Type of Cryotherapy Ice pack   Vasopneumatic   Number Minutes Vasopneumatic  15 minutes   Vasopnuematic Location  Knee  left   Vasopneumatic Pressure Medium   Vasopneumatic Temperature  3*                PT Education - 08/05/15 1055    Education provided Yes   Education Details HEP ITB stretching   Person(s) Educated Patient   Methods Explanation;Demonstration;Handout   Comprehension Returned demonstration;Verbalized understanding          PT Short Term Goals - 07/30/15 1816    PT SHORT TERM GOAL #1   Title independent with initial HEP  07/30/15: initial HEP reviewed today with pt. however pt. would benefit from further review for better understanding.    Time 2   Period Weeks   Status On-going           PT Long Term Goals - 07/30/15 1817    PT LONG TERM GOAL #1   Title report decreased pain 50%  Time 8   Period Weeks   Status On-going   PT LONG TERM GOAL #2   Title able to ascend and descend stairs step over step   Time 8   Period Weeks   Status On-going   PT LONG TERM GOAL #3   Title increase AROM of the left knee to 10-110 degrees flexion   Time 8   Period Weeks   Status On-going   PT LONG TERM GOAL #4   Title increase strength of the left knee to 4/5   Time 8   Period Weeks   Status On-going               Plan - 08/05/15 1046    Clinical Impression Statement Pt was able to perform more strengthening exercises today however had some increased knee pain with these.  She responds well to ice at the end of treatment.         Problem List Patient Active Problem List   Diagnosis Date Noted  . Anal pruritus 02/19/2015  . Panic attack 07/19/2014  . Microcytic anemia 05/02/2014  .  Leucocytosis 05/02/2014  . Pruritic rash 04/24/2014  . Bilateral knee pain 03/20/2014  . Healthcare maintenance 02/25/2014  . 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 HTN (hypertension) 02/07/2014  . Baker's cyst of knee 07/20/2013    Roderic Scarce PT 08/05/2015, 10:57 AM  [facility] [address]  [address] [place], [place], [address] Phone: [phone]   Fax:  [phone]  Name: Vicki Cook MRN: [id number] Date of Birth: February 25, 1961

## 2015-08-06 ENCOUNTER — Encounter: Payer: Self-pay | Admitting: Rehabilitation

## 2015-08-06 ENCOUNTER — Other Ambulatory Visit: Payer: Self-pay | Admitting: Internal Medicine

## 2015-08-06 DIAGNOSIS — Z1231 Encounter for screening mammogram for malignant neoplasm of breast: Secondary | ICD-10-CM

## 2015-08-07 MED FILL — AMLODIPINE BESYLATE 10 MG T: 10 | 90 days supply | Qty: 90 | Fill #2

## 2015-08-11 ENCOUNTER — Ambulatory Visit: Payer: No Typology Code available for payment source | Admitting: Physical Therapy

## 2015-08-11 DIAGNOSIS — M25661 Stiffness of right knee, not elsewhere classified: Secondary | ICD-10-CM

## 2015-08-11 DIAGNOSIS — M25562 Pain in left knee: Secondary | ICD-10-CM

## 2015-08-11 DIAGNOSIS — M25561 Pain in right knee: Secondary | ICD-10-CM

## 2015-08-11 DIAGNOSIS — M25662 Stiffness of left knee, not elsewhere classified: Secondary | ICD-10-CM

## 2015-08-11 DIAGNOSIS — R262 Difficulty in walking, not elsewhere classified: Secondary | ICD-10-CM

## 2015-08-11 NOTE — Therapy (Signed)
[facility] [address]  [address] [place], [place], [address] Phone: [phone]   Fax:  [phone]  Physical Therapy Treatment  Patient Details  Name: NATHIFA RITTHALER MRN: [id number] Date of Birth: [birth year] Referring Provider: Pearletha Forge  Encounter Date: 08/11/2015      PT End of Session - 08/11/15 1029    Visit Number 5   Number of Visits 16   Date for PT Re-Evaluation 09/28/15   PT Start Time 1018   PT Stop Time 1119   PT Time Calculation (min) 61 min   Activity Tolerance Patient tolerated treatment well   Behavior During Therapy [facility] for tasks assessed/performed      Past Medical History  Diagnosis Date  . Hypertension   . H/O pinworm infection     No past surgical history on file.  There were no vitals filed for this visit.      Subjective Assessment - 08/11/15 1023    Subjective Pt continues to report no pain at rest, but reports pain up to 4/10 with activities such as getting up from a low chair, walking fast or climbing stairs. States it's hard to predict which knee will be the one htat hurts.   Currently in Pain? No/denies   Pain Score --  up to 4/10 with activity   Pain Location Knee   Pain Orientation Right;Left  unpredictable   Pain Descriptors / Indicators Aching;Sore   Pain Type Chronic pain         Today's Treatment  TherEx NuStep - Lvl 4 x 4' B Heelcord stretch on Prostretch 2x30" B Gastroc & Soleus stretches at wall x30" each Manual B HS, piriformis and ITB stretches 2x30"  B RF stretch in mod thomas x 3 min with RF strumming Bridge + Hip ABD isometric with blue TB 10x3"  Bridge + Alternating Hip ABD/ER with blue TB x10  B fitter leg press knee extension x 10 reps (1 blue / 1 black)  Vasoneumatic compression to R knee with LE elevated -  medium compression, lowest temp x 15 min        PT Education - 08/11/15 1906    Education provided Yes   Education Details Basic  strengthening HEP   Person(s) Educated Patient   Methods Explanation;Demonstration;Handout   Comprehension Verbalized understanding;Returned demonstration;Need further instruction          PT Short Term Goals - 07/30/15 1816    PT SHORT TERM GOAL #1   Title independent with initial HEP  07/30/15: initial HEP reviewed today with pt. however pt. would benefit from further review for better understanding.    Time 2   Period Weeks   Status On-going           PT Long Term Goals - 08/11/15 1907    PT LONG TERM GOAL #1   Title report decreased pain 50%   Status On-going   PT LONG TERM GOAL #2   Title able to ascend and descend stairs step over step   Status On-going   PT LONG TERM GOAL #3   Title increase AROM of the left knee to 10-110 degrees flexion   Status On-going   PT LONG TERM GOAL #4   Title increase strength of the left knee to 4/5   Status On-going               Plan - 08/11/15 1912    Clinical Impression Statement Pt denies pain at rest  but still reporting pain up to 4/10 with activity with unpredictable lateralization L vs R. Updated HEP to include basic strengthening with good tolerance during return demonstration of exercises.   PT Treatment/Interventions ADLs/Self Care Home Management;Electrical Stimulation;Iontophoresis 4mg /ml Dexamethasone;Moist Heat;Therapeutic exercise;Therapeutic activities;Functional mobility training;Stair training;Gait training;Ultrasound;Balance training;Neuromuscular re-education;Patient/family education;Manual techniques;Taping;Cryotherapy;Vasopneumatic Device   PT Next Visit Plan continued HEP review; add exercises as tolerated, B LE stretching, iontophoresis.    Consulted and Agree with Plan of Care Patient      Patient will benefit from skilled therapeutic intervention in order to improve the following deficits and impairments:  Abnormal gait, Decreased activity tolerance, Decreased mobility, Decreased range of motion,  Difficulty walking, Decreased strength, Impaired flexibility, Pain, Increased muscle spasms  Visit Diagnosis: Pain in right knee  Difficulty in walking, not elsewhere classified  Pain in left knee  Stiffness of right knee, not elsewhere classified  Stiffness of left knee, not elsewhere classified     Problem List Patient Active Problem List   Diagnosis Date Noted  . Anal pruritus 02/19/2015  . Panic attack 07/19/2014  . Microcytic anemia 05/02/2014  . Leucocytosis 05/02/2014  . Pruritic rash 04/24/2014  . Bilateral knee pain 03/20/2014  . Healthcare maintenance 02/25/2014  . 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 HTN (hypertension) 02/07/2014  . Baker's cyst of knee 07/20/2013    07/22/2013, PT, MPT 08/11/2015, 7:14 PM  [facility] [address]  [address] [place], [place], [place] Phone: [phone]   Fax:  [phone]  Name: BERNESE DOFFING MRN: Gretchen Portela Date of Birth: [birth year]

## 2015-08-13 ENCOUNTER — Ambulatory Visit: Payer: No Typology Code available for payment source

## 2015-08-18 ENCOUNTER — Ambulatory Visit: Payer: No Typology Code available for payment source

## 2015-08-18 DIAGNOSIS — M25662 Stiffness of left knee, not elsewhere classified: Secondary | ICD-10-CM

## 2015-08-18 DIAGNOSIS — M25561 Pain in right knee: Secondary | ICD-10-CM

## 2015-08-18 DIAGNOSIS — M25562 Pain in left knee: Secondary | ICD-10-CM

## 2015-08-18 DIAGNOSIS — M25661 Stiffness of right knee, not elsewhere classified: Secondary | ICD-10-CM

## 2015-08-18 DIAGNOSIS — R262 Difficulty in walking, not elsewhere classified: Secondary | ICD-10-CM

## 2015-08-18 NOTE — Therapy (Signed)
[facility] [address]  [place], [place], [address] Phone: [phone]   Fax:  [phone]  Physical Therapy Treatment  Patient Details  Name: Vicki Cook MRN: [id number] Date of Birth: [birth year] Referring Provider: Barbaraann Barthel  Encounter Date: 08/18/2015      PT End of Session - 08/18/15 1030    Visit Number 6   Number of Visits 16   Date for PT Re-Evaluation 09/28/15   PT Start Time 1021   PT Stop Time 1105   PT Time Calculation (min) 44 min   Activity Tolerance Patient tolerated treatment well   Behavior During Therapy [facility] for tasks assessed/performed      Past Medical History  Diagnosis Date  . Hypertension   . H/O pinworm infection     History reviewed. No pertinent past surgical history.  There were no vitals filed for this visit.      Subjective Assessment - 08/18/15 1028    Subjective 4/10 R knee pain currently, 2/10 L knee pain currenly    Patient Stated Goals have less pain   Currently in Pain? Yes   Pain Score 4    Pain Location Knee   Pain Orientation Right;Posterior   Pain Descriptors / Indicators Aching;Sore   Pain Type Chronic pain   Pain Onset More than a month ago   Multiple Pain Sites Yes   Pain Score 2   Pain Location Knee   Pain Orientation Left   Pain Descriptors / Indicators Aching;Sore   Pain Onset More than a month ago      Today's Treatment:  TherEx: Recumbent bike: 4 min, level 2  B Heelcord stretch on Prostretch 2x30" B Gastroc & Soleus stretches at wall x30" each Manual B HS, piriformis and ITB stretches 2x30"  Bridge x 15 reps  HEP review: B HS stretch with strap x 30 sec  B SKTC stretch x 30 sec  B bridge with alternating green Tb x 15 reps  Mod thomas stretch x 30 sec  Standing calf stretch bent / straight leg stretch x 30 sec  Glute stretch x 30 sec  Vasoneumatic compression to L knee with LE elevated - medium compression, lowest temp x 10  min         PT Short Term Goals - 08/18/15 1103    PT SHORT TERM GOAL #1   Title independent with initial HEP  08/18/15: upadated HEP reviewed today with pt. however pt. would benefit from further review for better understanding.    Time 2   Period Weeks   Status Partially Met           PT Long Term Goals - 08/11/15 1907    PT LONG TERM GOAL #1   Title report decreased pain 50%   Status On-going   PT LONG TERM GOAL #2   Title able to ascend and descend stairs step over step   Status On-going   PT LONG TERM GOAL #3   Title increase AROM of the left knee to 10-110 degrees flexion   Status On-going   PT LONG TERM GOAL #4   Title increase strength of the left knee to 4/5   Status On-going               Plan - 08/18/15 1030    Clinical Impression Statement Pt. with less knee pain today, however reports B calf muscle tightness today.  Today's treatment focused on HEP review; HEP updated  and consolidated; pt. demo'd understanding of most HEP activities however would benefit from further review.  Pt. tolerated LE stretching and strengthening activity well today.     PT Treatment/Interventions ADLs/Self Care Home Management;Electrical Stimulation;Iontophoresis 64m/ml Dexamethasone;Moist Heat;Therapeutic exercise;Therapeutic activities;Functional mobility training;Stair training;Gait training;Ultrasound;Balance training;Neuromuscular re-education;Patient/family education;Manual techniques;Taping;Cryotherapy;Vasopneumatic Device   PT Next Visit Plan continued HEP review; add exercises as tolerated, B LE stretching, iontophoresis.    Consulted and Agree with Plan of Care --      Patient will benefit from skilled therapeutic intervention in order to improve the following deficits and impairments:  Abnormal gait, Decreased activity tolerance, Decreased mobility, Decreased range of motion, Difficulty walking, Decreased strength, Impaired flexibility, Pain, Increased muscle  spasms  Visit Diagnosis: Pain in right knee  Difficulty in walking, not elsewhere classified  Pain in left knee  Stiffness of right knee, not elsewhere classified  Stiffness of left knee, not elsewhere classified     Problem List Patient Active Problem List   Diagnosis Date Noted  . Anal pruritus 02/19/2015  . Panic attack 07/19/2014  . Microcytic anemia 05/02/2014  . Leucocytosis 05/02/2014  . Pruritic rash 04/24/2014  . Bilateral knee pain 03/20/2014  . Healthcare maintenance 02/25/2014  . 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 HTN (hypertension) 02/07/2014  . Baker's cyst of knee 07/20/2013    MBess Harvest PTA 08/18/2015, 12:34 PM  [facility] [place] [address]Phone: [phone]  Fax:  [phone] Name: Vicki BUCKMANMRN: [id number] of Birth: [birth year]

## 2015-08-20 ENCOUNTER — Ambulatory Visit: Payer: No Typology Code available for payment source

## 2015-08-20 DIAGNOSIS — M25562 Pain in left knee: Secondary | ICD-10-CM

## 2015-08-20 DIAGNOSIS — M25561 Pain in right knee: Secondary | ICD-10-CM

## 2015-08-20 DIAGNOSIS — M25661 Stiffness of right knee, not elsewhere classified: Secondary | ICD-10-CM

## 2015-08-20 DIAGNOSIS — R262 Difficulty in walking, not elsewhere classified: Secondary | ICD-10-CM

## 2015-08-20 DIAGNOSIS — M25662 Stiffness of left knee, not elsewhere classified: Secondary | ICD-10-CM

## 2015-08-20 NOTE — Therapy (Signed)
[facility] [address]  [place], [place], [address] Phone: [phone]   Fax:  [phone]  Physical Therapy Treatment  Patient Details  Name: Vicki Cook MRN: [id number] Date of Birth: Feb 19, 1961 Referring Provider: Barbaraann Barthel  Encounter Date: 08/20/2015      PT End of Session - 08/20/15 1042    Visit Number 7   Number of Visits 16   Date for PT Re-Evaluation 09/28/15   PT Start Time 1019   PT Stop Time 1100   PT Time Calculation (min) 41 min   Activity Tolerance Patient tolerated treatment well   Behavior During Therapy [facility] for tasks assessed/performed      Past Medical History  Diagnosis Date  . Hypertension   . H/O pinworm infection     No past surgical history on file.  There were no vitals filed for this visit.      Subjective Assessment - 08/20/15 1022    Subjective Pt. reports L knee pain 2/10 today, and R knee stiffness initially.     Patient Stated Goals have less pain   Currently in Pain? Yes   Pain Score 2    Pain Location Knee   Pain Orientation Left   Pain Descriptors / Indicators Aching;Sore   Pain Type Chronic pain            [facility] PT Assessment - 08/20/15 1037    AROM   Right Knee Extension 2   Right Knee Flexion 116   Left Knee Extension 6   Left Knee Flexion 118      Today's Treatment:  Therex: NuStep: level 3, 4 min B HS, SKTC x 30 sec each  Bridging x 10 reps Bridging with adduction squeeze x 10 reps  Bridging with B hip abd/ER with blue TB x 10 reps  HS curl with heels on peanut p-ball x 10 reps  Standing fitter hip ext. X 20 rep each leg (2 blue bands) BATCA leg curl machine x 15 reps B standing dorsiflexion rocker stretch x 30 sec  Iontophoresis: dexamethasone, R patellar tendon area, 80 mA, 1 mL, patch #2/6, 4-6 hrs  ROM assessment   Vasoneumatic device: supine, L LE elevated, medium compression, coldest temp., 15 min        PT Short Term  Goals - 08/18/15 1103    PT SHORT TERM GOAL #1   Title independent with initial HEP  08/18/15: upadated HEP reviewed today with pt. however pt. would benefit from further review for better understanding.    Time 2   Period Weeks   Status Partially Met           PT Long Term Goals - 08/20/15 1046    PT LONG TERM GOAL #1   Title report decreased pain 50%   Status Achieved  08/20/15: Pt. reports decrease of pain by 50%.    PT LONG TERM GOAL #2   Title able to ascend and descend stairs step over step   Status On-going   PT LONG TERM GOAL #3   Title increase AROM of the left knee to 10-110 degrees flexion   Status Achieved  08/20/15: Pt. L knee AROM 6-118 dg.    PT LONG TERM GOAL #4   Title increase strength of the left knee to 4/5   Status On-going               Plan - 08/20/15 1045    Clinical Impression Statement Pt  reports only R knee tightness, with 2/10 L knee pain initially today.  Pt. tolerated all hip / knee strengthening activity well today however continues to be tight in B LE's.  R knee swelling increased today; trial of iontophoresis to R patellar tendon area today; Ice / compression continued to L knee.     PT Treatment/Interventions ADLs/Self Care Home Management;Electrical Stimulation;Iontophoresis 90m/ml Dexamethasone;Moist Heat;Therapeutic exercise;Therapeutic activities;Functional mobility training;Stair training;Gait training;Ultrasound;Balance training;Neuromuscular re-education;Patient/family education;Manual techniques;Taping;Cryotherapy;Vasopneumatic Device   PT Next Visit Plan Continued HEP review; add exercises as tolerated, B LE stretching, iontophoresis.       Patient will benefit from skilled therapeutic intervention in order to improve the following deficits and impairments:  Abnormal gait, Decreased activity tolerance, Decreased mobility, Decreased range of motion, Difficulty walking, Decreased strength, Impaired flexibility, Pain, Increased muscle  spasms  Visit Diagnosis: Pain in right knee  Difficulty in walking, not elsewhere classified  Pain in left knee  Stiffness of right knee, not elsewhere classified  Stiffness of left knee, not elsewhere classified     Problem List Patient Active Problem List   Diagnosis Date Noted  . Anal pruritus 02/19/2015  . Panic attack 07/19/2014  . Microcytic anemia 05/02/2014  . Leucocytosis 05/02/2014  . Pruritic rash 04/24/2014  . Bilateral knee pain 03/20/2014  . Healthcare maintenance 02/25/2014  . 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 HTN (hypertension) 02/07/2014  . Baker's cyst of knee 07/20/2013    MBess Harvest PTA 08/20/2015, 11:36 AM  [facility] [place] [address]Phone: [phone]  Fax:  [phone] Name: Vicki BERRETTMRN: [id number] of Birth: [birth year]

## 2015-08-21 ENCOUNTER — Telehealth: Payer: Self-pay | Admitting: Internal Medicine

## 2015-08-21 NOTE — Telephone Encounter (Signed)
APPT. REMINDER CALL, LMTCB °

## 2015-08-22 ENCOUNTER — Ambulatory Visit (INDEPENDENT_AMBULATORY_CARE_PROVIDER_SITE_OTHER): Payer: Self-pay | Admitting: Internal Medicine

## 2015-08-22 ENCOUNTER — Encounter: Payer: Self-pay | Admitting: Internal Medicine

## 2015-08-22 ENCOUNTER — Telehealth: Payer: Self-pay | Admitting: Family Medicine

## 2015-08-22 VITALS — BP 143/84 | HR 86 | Temp 99.0°F | Wt 163.9 lb

## 2015-08-22 DIAGNOSIS — I10 Essential (primary) hypertension: Secondary | ICD-10-CM

## 2015-08-22 DIAGNOSIS — D509 Iron deficiency anemia, unspecified: Secondary | ICD-10-CM

## 2015-08-22 DIAGNOSIS — R5383 Other fatigue: Secondary | ICD-10-CM

## 2015-08-22 DIAGNOSIS — Z0000 Encounter for general adult medical examination without abnormal findings: Secondary | ICD-10-CM

## 2015-08-22 NOTE — Progress Notes (Signed)
Medicine attending: Medical history, presenting problems, physical findings, and medications, reviewed with resident physician Dr Diana Truong on the day of the patient visit and I concur with her evaluation and management plan. 

## 2015-08-22 NOTE — Assessment & Plan Note (Signed)
BP Readings from Last 3 Encounters:  08/22/15 143/84  07/15/15 138/90  03/13/15 150/90    Lab Results  Component Value Date   NA 133* 03/13/2015   K 3.9 03/13/2015   CREATININE 0.59 03/13/2015    Assessment: Blood pressure control:  controlled Progress toward BP goal:   near goal Comments: norvasc 10  Plan: Medications:  continue current medications Educational resources provided:   Self management tools provided:   Other plans: f/u 3-6 months. Counseled on weight loss.

## 2015-08-22 NOTE — Assessment & Plan Note (Signed)
Pt due for a pap smear. She is having irregular menstrual cycles, last one was in February and prior to that was in December. Likely she is perimenopausal. Denies any GU sx. Will continue to monitor. Pt to schedule a pap smear appt.

## 2015-08-22 NOTE — Progress Notes (Signed)
   Subjective:   Patient ID: Vicki Cook female   DOB: [birth year] 55 y.o.   MRN: [id number]  HPI: [name] is a 55 y.o. with past medical history as outlined below who presents to clinic for HTN f/u.   Please see problem list for status of the pt's chronic medical problems.  Past Medical History  Diagnosis Date  . Hypertension   . H/O pinworm infection    Current Outpatient Prescriptions  Medication Sig Dispense Refill  . amLODipine (NORVASC) 10 MG tablet Take 1 tablet (10 mg total) by mouth daily. 30 tablet 11  . Cetirizine HCl 10 MG CAPS Take 1 capsule (10 mg total) by mouth daily. (Patient not taking: Reported on 03/13/2015) 30 capsule 11  . glucosamine-chondroitin 500-400 MG tablet Take 1 tablet by mouth daily.    . Omega-3 Fatty Acids (FISH OIL PO) Take 1 tablet by mouth daily.     No current facility-administered medications for this visit.   Family History  Problem Relation Age of Onset  . Heart disease Mother   . Hyperlipidemia Mother   . Hypertension Mother   . Heart disease Father   . Hypertension Father   . Diabetes Father    Social History   Social History  . Marital Status: Married    Spouse Name: N/A  . Number of Children: N/A  . Years of Education: N/A   Social History Main Topics  . Smoking status: Never Smoker   . Smokeless tobacco: None  . Alcohol Use: No  . Drug Use: No  . Sexual Activity: Not Asked     Comment: has not had sex for several years   Other Topics Concern  . None   Social History Narrative   Review of Systems: Review of Systems  Constitutional: Positive for malaise/fatigue (mild fatigue). Negative for fever, chills and weight loss.       Neg for night sweats   Eyes: Negative for blurred vision.  Respiratory: Negative for shortness of breath.   Cardiovascular: Negative for chest pain and palpitations.  Genitourinary: Negative for dysuria.       Neg for vaginal d/c.   Neurological: Negative for weakness.    Psychiatric/Behavioral: The patient is nervous/anxious (chronic, stable).     Objective:  Physical Exam: Filed Vitals:   08/22/15 1047  BP: 143/84  Pulse: 86  Temp: 99 F (37.2 C)  TempSrc: Oral  Weight: 163 lb 14.4 oz (74.345 kg)  SpO2: 100%   Physical Exam  Constitutional: She appears well-developed and well-nourished. No distress.  HENT:  Head: Normocephalic and atraumatic.  Nose: Nose normal.  Eyes: Conjunctivae and EOM are normal. No scleral icterus.  Cardiovascular: Normal rate, regular rhythm and normal heart sounds.  Exam reveals no gallop and no friction rub.   No murmur heard. Pulmonary/Chest: Effort normal and breath sounds normal. No respiratory distress. She has no wheezes. She has no rales.  Abdominal: Soft. Bowel sounds are normal. She exhibits no distension. There is no tenderness. There is no rebound and no guarding.  Skin: She is not diaphoretic.    Assessment & Plan:   Please see problem based assessment and plan.

## 2015-08-22 NOTE — Assessment & Plan Note (Signed)
Pt has hx of mild anemia and low MCV. She also has mild leukocytosis on previous CBCs. She has some mild fatigue.   - checking CBC, ferritin, HIV, and hepatitis panel.

## 2015-08-23 LAB — CBC
HEMATOCRIT: 36.7 % (ref 34.0–46.6)
HEMOGLOBIN: 11.7 g/dL (ref 11.1–15.9)
MCH: 23.7 pg — AB (ref 26.6–33.0)
MCHC: 31.9 g/dL (ref 31.5–35.7)
MCV: 74 fL — AB (ref 79–97)
PLATELETS: 397 10*3/uL — AB (ref 150–379)
RBC: 4.93 x10E6/uL (ref 3.77–5.28)
RDW: 20 % — ABNORMAL HIGH (ref 12.3–15.4)
WBC: 10.8 10*3/uL (ref 3.4–10.8)

## 2015-08-23 LAB — HEPATITIS B SURFACE ANTIGEN: HEP B S AG: NEGATIVE

## 2015-08-23 LAB — HEPATITIS A ANTIBODY, TOTAL: Hep A Total Ab: POSITIVE — AB

## 2015-08-23 LAB — HEPATITIS B CORE ANTIBODY, TOTAL: HEP B C TOTAL AB: NEGATIVE

## 2015-08-23 LAB — FERRITIN: FERRITIN: 25 ng/mL (ref 15–150)

## 2015-08-23 LAB — HEPATITIS C ANTIBODY: Hep C Virus Ab: <0.1 s/co ratio (ref 0.0–0.9)

## 2015-08-23 LAB — HEPATITIS B SURFACE ANTIBODY,QUALITATIVE: HEP B SURFACE AB, QUAL: NONREACTIVE

## 2015-08-23 LAB — HIV ANTIBODY (ROUTINE TESTING W REFLEX): HIV SCREEN 4TH GENERATION: NONREACTIVE

## 2015-08-25 ENCOUNTER — Ambulatory Visit: Payer: No Typology Code available for payment source | Admitting: Rehabilitation

## 2015-08-25 ENCOUNTER — Encounter: Payer: Self-pay | Admitting: Rehabilitation

## 2015-08-25 DIAGNOSIS — M25662 Stiffness of left knee, not elsewhere classified: Secondary | ICD-10-CM

## 2015-08-25 DIAGNOSIS — M25562 Pain in left knee: Secondary | ICD-10-CM

## 2015-08-25 DIAGNOSIS — M25661 Stiffness of right knee, not elsewhere classified: Secondary | ICD-10-CM

## 2015-08-25 DIAGNOSIS — M25561 Pain in right knee: Secondary | ICD-10-CM

## 2015-08-25 DIAGNOSIS — R262 Difficulty in walking, not elsewhere classified: Secondary | ICD-10-CM

## 2015-08-25 NOTE — Therapy (Signed)
[facility] [address]  [place], [place], [address] Phone: [phone]   Fax:  [phone]  Physical Therapy Treatment  Patient Details  Name: Vicki Cook MRN: [id number] Date of Birth: [birth year] Referring Provider: Barbaraann Barthel  Encounter Date: 08/25/2015      PT End of Session - 08/25/15 1017    Visit Number 8   Number of Visits 16   Date for PT Re-Evaluation 09/28/15   PT Start Time 1013   PT Stop Time 1108   PT Time Calculation (min) 55 min   Activity Tolerance Patient tolerated treatment well      Past Medical History  Diagnosis Date  . Hypertension   . H/O pinworm infection     History reviewed. No pertinent past surgical history.  There were no vitals filed for this visit.      Subjective Assessment - 08/25/15 1013    Subjective L lateral knee pain from sit to stand   Limitations Standing;Walking   Currently in Pain? Yes   Pain Score 4    Pain Location Knee   Pain Orientation Left   Pain Score 2   Pain Location Knee   Pain Orientation Right      Today's Treatment:  Therex: NuStep: level 3, 5 min B HS, x 30 sec each  Bridging with adduction squeeze x 10 reps  Hooklying hip abd x 20 Bridging with B hip abd/ER with blue TB x 10 reps SLR x 15bil  HS curl with heels on peanut p-ball x 10 reps  Side steps blue band x 10 R/L Standing hip ext blue band x 10 bil BATCA leg curl machine x 20 reps 15# Step up fwd and side 6" x 10bil B standing dorsiflexion rocker stretch x 30 sec   Iontophoresis: dexamethasone, R patellar tendon area, 80 mA, 1 mL, patch #2/6, 4-6 h   Vasoneumatic device: supine, L LE elevated, medium compression, coldest temp., 15 min                                 PT Short Term Goals - 08/18/15 1103    PT SHORT TERM GOAL #1   Title independent with initial HEP  08/18/15: upadated HEP reviewed today with pt. however pt. would  benefit from further review for better understanding.    Time 2   Period Weeks   Status Partially Met           PT Long Term Goals - 08/20/15 1046    PT LONG TERM GOAL #1   Title report decreased pain 50%   Status Achieved  08/20/15: Pt. reports decrease of pain by 50%.    PT LONG TERM GOAL #2   Title able to ascend and descend stairs step over step   Status On-going   PT LONG TERM GOAL #3   Title increase AROM of the left knee to 10-110 degrees flexion   Status Achieved  08/20/15: Pt. L knee AROM 6-118 dg.    PT LONG TERM GOAL #4   Title increase strength of the left knee to 4/5   Status On-going               Plan - 08/25/15 1107    Clinical Impression Statement tolerated all well.  some increased R knee pain with step ups but only 1 grade.  continued to ice and use iontophoresis.  PT Next Visit Plan Continued HEP review; add exercises as tolerated, B LE stretching, iontophoresis.    Consulted and Agree with Plan of Care Patient      Patient will benefit from skilled therapeutic intervention in order to improve the following deficits and impairments:     Visit Diagnosis: Pain in right knee  Difficulty in walking, not elsewhere classified  Pain in left knee  Stiffness of right knee, not elsewhere classified  Stiffness of left knee, not elsewhere classified     Problem List Patient Active Problem List   Diagnosis Date Noted  . Anal pruritus 02/19/2015  . Panic attack 07/19/2014  . Microcytic anemia 05/02/2014  . Leucocytosis 05/02/2014  . Pruritic rash 04/24/2014  . Bilateral knee pain 03/20/2014  . Healthcare maintenance 02/25/2014  . HTN (hypertension) 02/07/2014  . Baker's cyst of knee 07/20/2013    Noemi Chapel DPT, CMP 08/25/2015, 11:31 AM  [facility] [address]  [place], [place], [address] Phone: [phone]   Fax:  [phone]  Name: AASHIKA CARTA MRN:  [id number] Date of Birth: 1961-01-21

## 2015-08-25 NOTE — Telephone Encounter (Signed)
Spoke to patient and scheduled a follow up appointment.

## 2015-08-26 ENCOUNTER — Ambulatory Visit
Admission: RE | Admit: 2015-08-26 | Discharge: 2015-08-26 | Disposition: A | Discharge status: Home / Self Care | Payer: No Typology Code available for payment source | Source: Ambulatory Visit | Attending: Internal Medicine | Admitting: Internal Medicine

## 2015-08-26 DIAGNOSIS — Z1231 Encounter for screening mammogram for malignant neoplasm of breast: Secondary | ICD-10-CM

## 2015-08-27 ENCOUNTER — Other Ambulatory Visit: Payer: Self-pay | Admitting: Internal Medicine

## 2015-08-27 ENCOUNTER — Ambulatory Visit: Payer: No Typology Code available for payment source

## 2015-08-27 DIAGNOSIS — R928 Other abnormal and inconclusive findings on diagnostic imaging of breast: Secondary | ICD-10-CM

## 2015-08-27 DIAGNOSIS — M25562 Pain in left knee: Secondary | ICD-10-CM

## 2015-08-27 DIAGNOSIS — M25561 Pain in right knee: Secondary | ICD-10-CM

## 2015-08-27 DIAGNOSIS — R262 Difficulty in walking, not elsewhere classified: Secondary | ICD-10-CM

## 2015-08-27 DIAGNOSIS — M25661 Stiffness of right knee, not elsewhere classified: Secondary | ICD-10-CM

## 2015-08-27 DIAGNOSIS — M25662 Stiffness of left knee, not elsewhere classified: Secondary | ICD-10-CM

## 2015-08-27 NOTE — Therapy (Signed)
[facility] [address]  [place], [place], [address] Phone: [phone]   Fax:  [phone]  Physical Therapy Treatment  Patient Details  Name: Vicki Cook MRN: [id number] Date of Birth: 1960/11/21 Referring Provider: Barbaraann Barthel  Encounter Date: 08/27/2015      PT End of Session - 08/27/15 1034    Visit Number 9   Number of Visits 16   Date for PT Re-Evaluation 09/28/15   PT Start Time 1020   PT Stop Time 1120   PT Time Calculation (min) 60 min   Activity Tolerance Patient tolerated treatment well   Behavior During Therapy [facility] for tasks assessed/performed      Past Medical History  Diagnosis Date  . Hypertension   . H/O pinworm infection     History reviewed. No pertinent past surgical history.  There were no vitals filed for this visit.      Subjective Assessment - 08/27/15 1024    Subjective Pt. reports 4/10 L anterior lateral knee pain today.  Pt. reports feeling tight and swollen in B forearms and wrists after washing dishes last night following having a broken dishwasher.  Pt. reports she was doing much better until the weather got cold again at which point she started to feel more pain in the knees and more swelling in the knees, wrists, and hands.     Limitations Standing;Walking   Patient Stated Goals have less pain   Currently in Pain? Yes   Pain Score 4    Pain Location Knee   Pain Orientation Left   Pain Descriptors / Indicators Aching;Constant   Pain Type Chronic pain   Pain Radiating Towards n/a   Pain Onset More than a month ago   Aggravating Factors  walking, stairs   Effect of Pain on Daily Activities difficulty walking and doing stairs.   Multiple Pain Sites No            [facility] PT Assessment - 08/27/15 1029    AROM   Right Knee Extension 2   Right Knee Flexion 119   Left Knee Extension 6   Left Knee Flexion 114   Strength   Right Knee Flexion 4/5   Right Knee  Extension 4/5   Left Knee Flexion 4-/5   Left Knee Extension 4-/5       Today's Treatment: Therex:  NuStep: level 3, 4 min  B HS, SKTC stretch x 30 sec  B step up onto 6" step x 10 each way; pt. demo'd decreased L quad control  BATCA HS curl machine x 15 reps 25#  Fitter hip extension, abduction (2 blue) x 10 reps each leg  Gait training stair navigation: Pt. able to ascend stairs x 6 with single rail use and step-to pattern only; pt. only able to descend stairs x 6 sideways having difficulty with step-to pattern with 2 UE support on rail and significant B hip drop and L quad weakness noted.    Iontophoresis: dexamethasone, L patellar tendon area, 80 mA, 1 mL, patch #3/6, 4-6 hr.  Vasoneumatic Device: supine, R LE elevated, coldest temp., medium compression, 15'  ROM Assessment       PT Short Term Goals - 08/18/15 1103    PT SHORT TERM GOAL #1   Title independent with initial HEP  08/18/15: upadated HEP reviewed today with pt. however pt. would benefit from further review for better understanding.    Time 2   Period Weeks  Status Partially Met           PT Long Term Goals - 08/27/15 1052    PT LONG TERM GOAL #1   Title report decreased pain 50%   Status Achieved  08/20/15: Pt. reports decrease of pain by 50%.    PT LONG TERM GOAL #2   Title able to ascend and descend stairs step over step   Status On-going  08/27/15: Pt. only able to ascend / descend stairs with step-to pattern at this time; pt. more comfortable turning sideways with descending stairs; pt. L quad / B hip instability with step-to descending with B UE support on handrail.     PT LONG TERM GOAL #3   Title increase AROM of the left knee to 10-110 degrees flexion   Status Achieved  08/20/15: Pt. L knee AROM 6-118 dg.    PT LONG TERM GOAL #4   Title increase strength of the left knee to 4/5   Status On-going  08/27/15: Pt. L knee strength 4-/5 in flexion and extension.                 Plan -  08/27/15 1034    Clinical Impression Statement Pt. L knee strength 4-/5 in flexion and extension motions, however L knee ROM achieved at this point.  Pt. unable to ascend / descend stairs with step-over-step pattern; pt. required 2 UE support on rail with step/to pattern descending stairs; pt. more comfortable descending sideways at this point with significant B hip weakness still present with L quad instability noted.  Pt. reports she was doing significantly better with pain and inflammation at B knees before the weather changed and temperature drop however now reports 6/10 pain with stepping activity in B knees and significant inflammation noted still around L knee.  Iontophoresis application to L knee today following last treatment application to R knee to reduce inflammation.     PT Treatment/Interventions ADLs/Self Care Home Management;Electrical Stimulation;Iontophoresis 64m/ml Dexamethasone;Moist Heat;Therapeutic exercise;Therapeutic activities;Functional mobility training;Stair training;Gait training;Ultrasound;Balance training;Neuromuscular re-education;Patient/family education;Manual techniques;Taping;Cryotherapy;Vasopneumatic Device   PT Next Visit Plan Continued HEP review; add exercises as tolerated, B LE stretching, iontophoresis.    Consulted and Agree with Plan of Care --      Patient will benefit from skilled therapeutic intervention in order to improve the following deficits and impairments:  Abnormal gait, Decreased activity tolerance, Decreased mobility, Decreased range of motion, Difficulty walking, Decreased strength, Impaired flexibility, Pain, Increased muscle spasms  Visit Diagnosis: Pain in right knee  Difficulty in walking, not elsewhere classified  Pain in left knee  Stiffness of right knee, not elsewhere classified  Stiffness of left knee, not elsewhere classified     Problem List Patient Active Problem List   Diagnosis Date Noted  . Anal pruritus 02/19/2015   . Panic attack 07/19/2014  . Microcytic anemia 05/02/2014  . Leucocytosis 05/02/2014  . Pruritic rash 04/24/2014  . Bilateral knee pain 03/20/2014  . Healthcare maintenance 02/25/2014  . HTN (hypertension) 02/07/2014  . Baker's cyst of knee 07/20/2013    MBess Harvest PTA 08/27/2015, 11:27 AM  [facility] [place] [address]Phone: [phone]  Fax:  [phone] Name: ASHAMAINE MULKERNMRN: [id number] of Birth: 6Jul 01, 1962

## 2015-08-28 ENCOUNTER — Ambulatory Visit (INDEPENDENT_AMBULATORY_CARE_PROVIDER_SITE_OTHER): Payer: Self-pay | Admitting: Family Medicine

## 2015-08-28 ENCOUNTER — Encounter: Payer: Self-pay | Admitting: Family Medicine

## 2015-08-28 VITALS — BP 151/96 | HR 91 | Ht 60.0 in | Wt 163.0 lb

## 2015-08-28 DIAGNOSIS — M25562 Pain in left knee: Secondary | ICD-10-CM

## 2015-08-28 DIAGNOSIS — M25561 Pain in right knee: Secondary | ICD-10-CM

## 2015-08-28 NOTE — Patient Instructions (Signed)
We are awaiting word from rheumatology - I'm concerned with your knee swelling, the fluid analysis, your bilateral wrist pain and swelling that you have an inflammatory arthritis. You also have an overuse calf strain. Can take aleve or ibuprofen as needed. Calf sleeve when up and walking around until pain has resolved. Heel lifts (or shoes with a natural lift) to help rest the calf. Do home ankle exercises 3 sets of 10 once a day with the theraband. Can do calf raises as well when pain resolves. Follow up with me as needed (assuming we get you in with the rheumatologist).

## 2015-08-29 NOTE — Assessment & Plan Note (Signed)
2/2 DJD with effusions.  Synovial fluid analysis with 82505 WBCs, 63% neutrophils - no growth or crystals.  She is also reporting intermittent bilateral wrist swelling, pain though none currently on exam and no pain.  Awaiting word on rheumatology referral as they review her notes for inflammatory arthropathy.

## 2015-08-29 NOTE — Progress Notes (Signed)
Patient ID: Vicki Cook, female   DOB: 19-Jan-1961, 55 y.o.   MRN: [id number]  PCP: Gara Kroner, MD  Subjective:   HPI: Patient is a 55 y.o. female here for bilateral knee pain.  11/06/13: Patient has been seen previously in [place] office for issues with bilateral leg and knee swelling. She's had this for over 8 months now. No known injury. Causes pain, swelling, tightness in backs of knees into calf muscles. 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  No meniscal tears, other pathology of note. She had intraarticular injection of left knee which helped but was painful for first 2 weeks - pain has worsened over past month. Tried to have right bakers cyst aspirated and injection - only 1-2 mL thick cystic fluid obtained before injection. Left one is worse than right currently. Only has cone coverage at the moment. Tried ibuprofen 600 three times a day. Worse when on her feet or walking a lot.  03/18/14: Patient reports she improved some following last injection into left knee. Pain currently 4/10 and radiates to back of knee. Bothers her every day. Worse with prolonged sitting then going to get up. Has been wrapping with ACE bandage which has helped. No catching, locking, giving out.  07/15/15: Patient returns with left worse than right knee pain. States pain is up to 9/10 on left, sharp. Worse with stairs. Feels prior injections did not help. Pain radiates to posterior knees. States cannot work because pain is so bad it impairs her ability to walk. No skin changes, fever.  4/27: Patient returns and her knees have improved - now 0/10 pain but gets a discomfort when walking. Reports increased pain and swelling of both wrists as well that has improved since last visit. Hasn't tried any medicines for this, only relative rest. Otherwise doing well  currently. Some pain in medial calf with walking now. No skin changes, fevers.  Past Medical History  Diagnosis Date  . Hypertension   . H/O pinworm infection     Current Outpatient Prescriptions on File Prior to Visit  Medication Sig Dispense Refill  . amLODipine (NORVASC) 10 MG tablet Take 1 tablet (10 mg total) by mouth daily. 30 tablet 11  . Cetirizine HCl 10 MG CAPS Take 1 capsule (10 mg total) by mouth daily. (Patient not taking: Reported on 03/13/2015) 30 capsule 11  . glucosamine-chondroitin 500-400 MG tablet Take 1 tablet by mouth daily.    . Omega-3 Fatty Acids (FISH OIL PO) Take 1 tablet by mouth daily.     No current facility-administered medications on file prior to visit.    No past surgical history on file.  Allergies  Allergen Reactions  . Asa [Aspirin] Nausea And Vomiting and Other (See Comments)    Burning in stomach   . Lisinopril Other (See Comments)    Had pain in her knees     Social History   Social History  . Marital Status: Married    Spouse Name: N/A  . Number of Children: N/A  . Years of Education: N/A   Occupational History  . Not on file.   Social History Main Topics  . Smoking status: Never Smoker   . Smokeless tobacco: Not on file  . Alcohol Use: No  . Drug Use: No  . Sexual Activity: Not on file     Comment: has not had sex for  several years   Other Topics Concern  . Not on file   Social History Narrative    Family History  Problem Relation Age of Onset  . Heart disease Mother   . Hyperlipidemia Mother   . Hypertension Mother   . Heart disease Father   . Hypertension Father   . Diabetes Father     BP 151/96 mmHg  Pulse 91  Ht 5' (1.524 m)  Wt 163 lb (73.936 kg)  BMI 31.83 kg/m2  LMP 06/24/2015  Review of Systems: See HPI above.    Objective:  Physical Exam:  Gen: NAD  Left knee: No effusion, bruising, other deformity. Mild TTP medial gastroc only. FROM. Negative ant/post drawers. Negative valgus/varus  testing. Negative lachmanns. Negative mcmurrays, apleys. NV intact distally.  Bilateral wrists: No swelling, bruising, other deformity, warmth. No TTP currently. FROM. Strength 5/5 with finger abduction, flexion, extension, thumb opposition, wrist flexion and extension. NVI distally.    Assessment & Plan:  1. Bilateral knee pain - 2/2 DJD with effusions.  Synovial fluid analysis with 48270 WBCs, 63% neutrophils - no growth or crystals.  She is also reporting intermittent bilateral wrist swelling, pain though none currently on exam and no pain.  Awaiting word on rheumatology referral as they review her notes for inflammatory arthropathy.

## 2015-09-02 ENCOUNTER — Other Ambulatory Visit: Payer: Self-pay

## 2015-09-02 ENCOUNTER — Ambulatory Visit: Payer: No Typology Code available for payment source | Attending: Family Medicine | Admitting: Physical Therapy

## 2015-09-02 ENCOUNTER — Other Ambulatory Visit: Payer: Self-pay | Admitting: Internal Medicine

## 2015-09-02 DIAGNOSIS — M25561 Pain in right knee: Secondary | ICD-10-CM | POA: Insufficient documentation

## 2015-09-02 DIAGNOSIS — M25661 Stiffness of right knee, not elsewhere classified: Secondary | ICD-10-CM

## 2015-09-02 DIAGNOSIS — R928 Other abnormal and inconclusive findings on diagnostic imaging of breast: Secondary | ICD-10-CM

## 2015-09-02 DIAGNOSIS — M25662 Stiffness of left knee, not elsewhere classified: Secondary | ICD-10-CM

## 2015-09-02 DIAGNOSIS — R262 Difficulty in walking, not elsewhere classified: Secondary | ICD-10-CM | POA: Insufficient documentation

## 2015-09-02 DIAGNOSIS — M25562 Pain in left knee: Secondary | ICD-10-CM | POA: Insufficient documentation

## 2015-09-02 NOTE — Therapy (Signed)
[facility] [address]  [place], [place], [address] Phone: [phone]   Fax:  [phone]  Physical Therapy Treatment  Patient Details  Name: Vicki Cook MRN: [id number] Date of Birth: May 30, 1960 Referring Provider: Barbaraann Barthel  Encounter Date: 09/02/2015      PT End of Session - 09/02/15 1030    Visit Number 10   Number of Visits 16   Date for PT Re-Evaluation 09/28/15   PT Start Time 1020   PT Stop Time 1105   PT Time Calculation (min) 45 min   Activity Tolerance Patient tolerated treatment well   Behavior During Therapy [facility] for tasks assessed/performed      Past Medical History  Diagnosis Date  . Hypertension   . H/O pinworm infection     No past surgical history on file.  There were no vitals filed for this visit.      Subjective Assessment - 09/02/15 1026    Subjective Pt reporting no pain at rest but feels like she lacks confidence to try walking due to fear that pain may worsen. Saw MD last week and was given a compression sleeve for her calf, heel lift for her shoe and ankle exercises to address overuse calf injury.   Currently in Pain? No/denies            [facility] PT Assessment - 09/02/15 1020    Observation/Other Assessments   Focus on Therapeutic Outcomes (FOTO)  Knee 51% (49% limitation)         Today's Treatment  TherEx NuStep: level 3, 5 min  4 way ankle with green TB x10 each (reviewed MD prescribed exercises) BATCA Leg Press 15# x10, 20# x10 BATCA Ankle Press on Leg Press 10# x10 BATCA HS curl machine 20# x15; 15# B concentric/Alternating R/L eccentric x5, additional x5 B concentric/ R eccentric B standing dorsiflexion rocker stretch x 30 sec  6" Step up fwd and side x 10 bil (limited L quad control)          PT Education - 09/02/15 1110    Education provided Yes   Education Details Review of MD prescribe ankle TB exercises with green TB provided   Person(s)  Educated Patient   Methods Explanation;Demonstration;Handout;Verbal cues;Tactile cues   Comprehension Verbalized understanding;Returned demonstration;Verbal cues required;Tactile cues required;Need further instruction          PT Short Term Goals - 09/02/15 1120    PT SHORT TERM GOAL #1   Title independent with initial HEP   Time 2   Period Weeks   Status Partially Met  Continues to require intermittent review of HEP due to limited ability to recreate exercises from memory           PT Long Term Goals - 09/02/15 1122    PT LONG TERM GOAL #1   Title report decreased pain 50%   Status Achieved   PT LONG TERM GOAL #2   Title able to ascend and descend stairs step over step   Status On-going  08/27/15: Pt. only able to ascend / descend stairs with step-to pattern at this time; pt. more comfortable turning sideways with descending stairs; pt. L quad / B hip instability with step-to descending with B UE support on handrail.     PT LONG TERM GOAL #3   Title increase AROM of the left knee to 10-110 degrees flexion   Status Achieved   PT LONG TERM GOAL #4   Title increase  strength of the left knee to 4/5   Status On-going  08/27/15: Pt. L knee strength 4-/5 in flexion and extension.                 Plan - Oct 02, 2015 1114    Clinical Impression Statement Pt reporting no pain at rest and improving walking tolerance however states she lacks confidence with increasing activity due to fear of flaring things up. Noting some relief from L calf compression sleeve and heel lift provided by MD but had difficulty demonstrating ankle exercises demonstrated to her by MD's RN, therefore reviewd these exercises and provided HEP handout and green TB to use at home.   PT Treatment/Interventions ADLs/Self Care Home Management;Electrical Stimulation;Iontophoresis 79m/ml Dexamethasone;Moist Heat;Therapeutic exercise;Therapeutic activities;Functional mobility training;Stair training;Gait  training;Ultrasound;Balance training;Neuromuscular re-education;Patient/family education;Manual techniques;Taping;Cryotherapy;Vasopneumatic Device   PT Next Visit Plan Continued HEP review; add exercises as tolerated, B LE stretching, iontophoresis patch (#4 of 6) PRN.    Consulted and Agree with Plan of Care Patient      Patient will benefit from skilled therapeutic intervention in order to improve the following deficits and impairments:  Abnormal gait, Decreased activity tolerance, Decreased mobility, Decreased range of motion, Difficulty walking, Decreased strength, Impaired flexibility, Pain, Increased muscle spasms  Visit Diagnosis: Pain in right knee  Difficulty in walking, not elsewhere classified  Pain in left knee  Stiffness of right knee, not elsewhere classified  Stiffness of left knee, not elsewhere classified       G-Codes - 0Jun 01, [id number]    Functional Assessment Tool Used Knee FOTO = 49% limitation   Functional Limitation Mobility: Walking and moving around   Mobility: Walking and Moving Around Current Status (([id number] At least 40 percent but less than 60 percent impaired, limited or restricted   Mobility: Walking and Moving Around Goal Status [phone] At least 40 percent but less than 60 percent impaired, limited or restricted      Problem List Patient Active Problem List   Diagnosis Date Noted  . Anal pruritus 02/19/2015  . Panic attack 07/19/2014  . Microcytic anemia 05/02/2014  . Leucocytosis 05/02/2014  . Pruritic rash 04/24/2014  . Bilateral knee pain 03/20/2014  . Healthcare maintenance 02/25/2014  . HTN (hypertension) 02/07/2014  . Baker's cyst of knee 07/20/2013    JPercival Spanish PT, MPT 5June 01, 2017 11:26 AM  [facility] [place] [address]Phone: [phone]  Fax:  [phone] Name: AAVEREE HARBMRN: [id number] of Birth: [birth year]

## 2015-09-03 ENCOUNTER — Ambulatory Visit
Admission: RE | Admit: 2015-09-03 | Discharge: 2015-09-03 | Disposition: A | Discharge status: Home / Self Care | Payer: No Typology Code available for payment source | Source: Ambulatory Visit | Attending: Internal Medicine | Admitting: Internal Medicine

## 2015-09-03 DIAGNOSIS — R928 Other abnormal and inconclusive findings on diagnostic imaging of breast: Secondary | ICD-10-CM

## 2015-09-05 ENCOUNTER — Ambulatory Visit: Payer: No Typology Code available for payment source

## 2015-09-05 DIAGNOSIS — R262 Difficulty in walking, not elsewhere classified: Secondary | ICD-10-CM

## 2015-09-05 DIAGNOSIS — M25662 Stiffness of left knee, not elsewhere classified: Secondary | ICD-10-CM

## 2015-09-05 DIAGNOSIS — M25562 Pain in left knee: Secondary | ICD-10-CM

## 2015-09-05 DIAGNOSIS — M25661 Stiffness of right knee, not elsewhere classified: Secondary | ICD-10-CM

## 2015-09-05 DIAGNOSIS — M25561 Pain in right knee: Secondary | ICD-10-CM

## 2015-09-05 NOTE — Therapy (Signed)
[facility] [address]  [place], [place], [address] Phone: [phone]   Fax:  [phone]  Physical Therapy Treatment  Patient Details  Name: Vicki Cook MRN: [id number] Date of Birth: 04-15-1961 Referring Provider: Barbaraann Barthel  Encounter Date: 09/05/2015      PT End of Session - 09/05/15 1026    Visit Number 11   Number of Visits 16   Date for PT Re-Evaluation 09/28/15   PT Start Time 1020   PT Stop Time 1120   PT Time Calculation (min) 60 min   Activity Tolerance Patient tolerated treatment well   Behavior During Therapy [facility] for tasks assessed/performed      Past Medical History  Diagnosis Date  . Hypertension   . H/O pinworm infection     History reviewed. No pertinent past surgical history.  There were no vitals filed for this visit.      Subjective Assessment - 09/05/15 1024    Subjective Pt. reports her L knee feels much better today at 2/10 L knee pain.  Pt. reports no pain in R knee currently.     Patient Stated Goals have less pain   Currently in Pain? Yes   Pain Score 2    Pain Location Knee   Pain Orientation Left   Pain Descriptors / Indicators Aching;Constant   Pain Type Chronic pain   Multiple Pain Sites No        Today's Treatment  TherEx NuStep: level 3, 4 min  L HS/PF, piri, glute, stretch x 30 sec  BATCA Leg Press 15# x15, 20# x10 Dorsiflexion rocker stretch x 30 sec  Heel raises on UBE x 15 reps BATCA HS curl machine 25# 10; 15# B concentric/ R eccentric  B standing dorsiflexion rocker stretch x 30 sec  6" Step up fwd and side x 10 bil (limited L quad control)  Iontophoresis: dexamethasone, L patellar tendon area, 80 mA, 1 mL, patch #4/6, 4-6 hr  Vasoneumatic Device: supine, R LE elevated, coldest temp., medium compression, 15'        PT Short Term Goals - 09/02/15 1120    PT SHORT TERM GOAL #1   Title independent with initial HEP   Time 2   Period  Weeks   Status Partially Met  Continues to require intermittent review of HEP due to limited ability to recreate exercises from memory           PT Long Term Goals - 09/02/15 1122    PT LONG TERM GOAL #1   Title report decreased pain 50%   Status Achieved   PT LONG TERM GOAL #2   Title able to ascend and descend stairs step over step   Status On-going  08/27/15: Pt. only able to ascend / descend stairs with step-to pattern at this time; pt. more comfortable turning sideways with descending stairs; pt. L quad / B hip instability with step-to descending with B UE support on handrail.     PT LONG TERM GOAL #3   Title increase AROM of the left knee to 10-110 degrees flexion   Status Achieved   PT LONG TERM GOAL #4   Title increase strength of the left knee to 4/5   Status On-going  08/27/15: Pt. L knee strength 4-/5 in flexion and extension.                 Plan - 09/05/15 1115    Clinical Impression Statement Pt. reports  her L knee feeling much better today at a 2/10 pain, with right knee pain free today.  Pt. reports she has been performing all HEP activities over the last few days and feels., " much better now".  Pt. tolerated LE strengthening activity very well today able to increase resistance with all BATCA LE strengthening machines and reporting no pain increase with these.  Ionto patch #4/6 applied to L medial knee today following ice.  Pt. progressing toward goals well.     PT Treatment/Interventions ADLs/Self Care Home Management;Electrical Stimulation;Iontophoresis 67m/ml Dexamethasone;Moist Heat;Therapeutic exercise;Therapeutic activities;Functional mobility training;Stair training;Gait training;Ultrasound;Balance training;Neuromuscular re-education;Patient/family education;Manual techniques;Taping;Cryotherapy;Vasopneumatic Device   PT Next Visit Plan Continued HEP review; add exercises as tolerated, B LE stretching, iontophoresis patch (#5 of 6) PRN.       Patient will  benefit from skilled therapeutic intervention in order to improve the following deficits and impairments:  Abnormal gait, Decreased activity tolerance, Decreased mobility, Decreased range of motion, Difficulty walking, Decreased strength, Impaired flexibility, Pain, Increased muscle spasms  Visit Diagnosis: Pain in right knee  Difficulty in walking, not elsewhere classified  Pain in left knee  Stiffness of right knee, not elsewhere classified  Stiffness of left knee, not elsewhere classified     Problem List Patient Active Problem List   Diagnosis Date Noted  . Anal pruritus 02/19/2015  . Panic attack 07/19/2014  . Microcytic anemia 05/02/2014  . Leucocytosis 05/02/2014  . Pruritic rash 04/24/2014  . Bilateral knee pain 03/20/2014  . Healthcare maintenance 02/25/2014  . HTN (hypertension) 02/07/2014  . Baker's cyst of knee 07/20/2013    MBess Harvest PTA 09/05/2015, 12:05 PM  [facility] [place] [address]Phone: [phone]  Fax:  [phone] Name: Vicki CAREYMRN: [id number] of Birth: 618-Jan-1962

## 2015-09-09 ENCOUNTER — Ambulatory Visit: Payer: No Typology Code available for payment source

## 2015-09-09 DIAGNOSIS — M25662 Stiffness of left knee, not elsewhere classified: Secondary | ICD-10-CM

## 2015-09-09 DIAGNOSIS — R262 Difficulty in walking, not elsewhere classified: Secondary | ICD-10-CM

## 2015-09-09 DIAGNOSIS — M25661 Stiffness of right knee, not elsewhere classified: Secondary | ICD-10-CM

## 2015-09-09 DIAGNOSIS — M25561 Pain in right knee: Secondary | ICD-10-CM

## 2015-09-09 DIAGNOSIS — M25562 Pain in left knee: Secondary | ICD-10-CM

## 2015-09-09 NOTE — Therapy (Signed)
[facility] [address]  [address] [place], [place], [address] Phone: [phone]   Fax:  [phone]  Physical Therapy Treatment  Patient Details  Name: Vicki Cook MRN: [id number] Date of Birth: Apr 07, 1961 Referring Provider: Pearletha Forge   Encounter Date: 09/09/2015      PT End of Session - 09/09/15 1042    Visit Number 12   Number of Visits 16   Date for PT Re-Evaluation 09/28/15   PT Start Time 1018   PT Stop Time 1110   PT Time Calculation (min) 52 min   Activity Tolerance Patient tolerated treatment well   Behavior During Therapy [facility] for tasks assessed/performed      Past Medical History  Diagnosis Date  . Hypertension   . H/O pinworm infection     No past surgical history on file.  There were no vitals filed for this visit.      Subjective Assessment - 09/09/15 1024    Subjective Pt. reports no new changes 2/10 L knee pain   Patient Stated Goals have less pain   Currently in Pain? Yes   Pain Score 2    Pain Location Knee   Pain Orientation Left   Pain Descriptors / Indicators Aching;Constant   Pain Type Chronic pain   Pain Onset More than a month ago   Multiple Pain Sites No       Today's Treatment:  TherEx: NuStep: level 3, 4 min  L HS/PF, piri, glute, stretch x 30 sec  Bridging x 15 reps  Bridging with B hip adb/ER with blue TB around knees x 10 reps   HEP review (all activities only performed 1 rep of): RF stretch (mod thomas) SKTC stretch  HS stretch Glute stretch  Bridge  Bridge with B hip abd/ER with blue band 4 directional ankle with band (from Dr.) Standing bent and straight leg calf stretch  Iontophoresis: dexamethasone, L patellar tendon area, 80 mA, 1 mL, patch #5/6, 4-6 hr  2 ice packs to L knee in supine, L LE elevated, 10',        [facility] PT Assessment - 09/09/15 1041    Assessment   Medical Diagnosis bilateral knee pain   Referring Provider Hudnall    Onset  Date/Surgical Date 08/17/15   Next MD Visit sometime in June          PT Education - 09/09/15 1206    Education provided Yes   Education Details Reviewed all current HEP activities    Person(s) Educated Patient   Methods Explanation;Verbal cues;Handout   Comprehension Verbalized understanding;Returned demonstration          PT Short Term Goals - 09/09/15 1207    PT SHORT TERM GOAL #1   Title independent with initial HEP   Time 2   Period Weeks   Status Achieved  09/09/15: Pt. independent with current HEP.            PT Long Term Goals - 09/02/15 1122    PT LONG TERM GOAL #1   Title report decreased pain 50%   Status Achieved   PT LONG TERM GOAL #2   Title able to ascend and descend stairs step over step   Status On-going  08/27/15: Pt. only able to ascend / descend stairs with step-to pattern at this time; pt. more comfortable turning sideways with descending stairs; pt. L quad / B hip instability with step-to descending with B UE support on handrail.  PT LONG TERM GOAL #3   Title increase AROM of the left knee to 10-110 degrees flexion   Status Achieved   PT LONG TERM GOAL #4   Title increase strength of the left knee to 4/5   Status On-going  08/27/15: Pt. L knee strength 4-/5 in flexion and extension.                 Plan - 09/09/15 1043    Clinical Impression Statement Pt. tolerated all hip / knee strengthening activitiy in standing well today with 2/10 L knee pain initially and < 2/10 L knee pain reported following therex.  Pt. reports ice and iontophoresis combo application at end of treatment is helping L knee pain, "a lot"; iontophoresis (patch #5/6) and ice application continued today.  HEP reviewed today with pt. able to recall and demo all activities well.     PT Treatment/Interventions ADLs/Self Care Home Management;Electrical Stimulation;Iontophoresis 4mg /ml Dexamethasone;Moist Heat;Therapeutic exercise;Therapeutic activities;Functional mobility  training;Stair training;Gait training;Ultrasound;Balance training;Neuromuscular re-education;Patient/family education;Manual techniques;Taping;Cryotherapy;Vasopneumatic Device   PT Next Visit Plan Add exercises as tolerated, B LE stretching, iontophoresis patch (#5 of 6) PRN.       Patient will benefit from skilled therapeutic intervention in order to improve the following deficits and impairments:  Abnormal gait, Decreased activity tolerance, Decreased mobility, Decreased range of motion, Difficulty walking, Decreased strength, Impaired flexibility, Pain, Increased muscle spasms  Visit Diagnosis: Pain in right knee  Difficulty in walking, not elsewhere classified  Pain in left knee  Stiffness of right knee, not elsewhere classified  Stiffness of left knee, not elsewhere classified     Problem List Patient Active Problem List   Diagnosis Date Noted  . Anal pruritus 02/19/2015  . Panic attack 07/19/2014  . Microcytic anemia 05/02/2014  . Leucocytosis 05/02/2014  . Pruritic rash 04/24/2014  . Bilateral knee pain 03/20/2014  . Healthcare maintenance 02/25/2014  . HTN (hypertension) 02/07/2014  . Baker's cyst of knee 07/20/2013    07/22/2013, PTA 09/09/2015, 12:20 PM  [facility] [address]  [address] [place], [place], [place] Phone: [phone]   Fax:  [phone]  Name: ROSALYNN SERGENT MRN: Gretchen Portela Date of Birth: Apr 20, 1961

## 2015-09-12 ENCOUNTER — Ambulatory Visit: Payer: No Typology Code available for payment source | Admitting: Physical Therapy

## 2015-09-12 DIAGNOSIS — M25562 Pain in left knee: Secondary | ICD-10-CM

## 2015-09-12 DIAGNOSIS — M25661 Stiffness of right knee, not elsewhere classified: Secondary | ICD-10-CM

## 2015-09-12 DIAGNOSIS — M25561 Pain in right knee: Secondary | ICD-10-CM

## 2015-09-12 DIAGNOSIS — R262 Difficulty in walking, not elsewhere classified: Secondary | ICD-10-CM

## 2015-09-12 NOTE — Therapy (Signed)
[facility] [address]  [address] [place], [place], [address] Phone: [phone]   Fax:  [phone]  Physical Therapy Treatment  Patient Details  Name: Vicki Cook MRN: [id number] Date of Birth: 17-Mar-1961 Referring Provider: Pearletha Forge   Encounter Date: 09/12/2015      PT End of Session - 09/12/15 1031    Visit Number 13   Number of Visits 16   Date for PT Re-Evaluation 09/28/15   PT Start Time 1022   PT Stop Time 1116   PT Time Calculation (min) 54 min   Activity Tolerance Patient tolerated treatment well   Behavior During Therapy [facility] for tasks assessed/performed      Past Medical History  Diagnosis Date  . Hypertension   . H/O pinworm infection     No past surgical history on file.  There were no vitals filed for this visit.      Subjective Assessment - 09/12/15 1028    Subjective Pt describing more of a sensation of discomfort/stiffness/heaviness than pain in knee today, with similar feeling in UE's as well. States her appt with the rheumatologist is not until 12/03/15.   Currently in Pain? Yes   Pain Score 6    Pain Location Knee   Pain Orientation Left   Pain Descriptors / Indicators Discomfort;Heaviness  Stiffness          Today's Treatment  TherEx NuStep  level 4 x 4' B Manual HS/PF, piri, glute, ITB stretch x 30 sec  Bridge + Alternating Hip ABD/ER with blue TB x15 B Sidelying Hip ABD/ER Clam with blue TB x15 B Straight leg bridge with heels on peanut ball x10 HS curl + Bridge with heels on peanut ball x10  Kinesiotape - B knees   3 "I" strips - 30% horizontally across inferior pole of patella/patellar tendon, 30% medial & lateral to patella crossing over tibial tuberosity  Ice packs to B knees x10' with LE's elevated on bolster           PT Short Term Goals - 09/09/15 1207    PT SHORT TERM GOAL #1   Title independent with initial HEP   Time 2   Period Weeks   Status  Achieved  09/09/15: Pt. independent with current HEP.            PT Long Term Goals - 09/12/15 1202    PT LONG TERM GOAL #1   Title report decreased pain 50%   Status Achieved   PT LONG TERM GOAL #2   Title able to ascend and descend stairs step over step   Status On-going  08/27/15: Pt. only able to ascend / descend stairs with step-to pattern at this time; pt. more comfortable turning sideways with descending stairs; pt. L quad / B hip instability with step-to descending with B UE support on handrail.     PT LONG TERM GOAL #3   Title increase AROM of the left knee to 10-110 degrees flexion   Status Achieved   PT LONG TERM GOAL #4   Title increase strength of the left knee to 4/5   Status On-going  08/27/15: Pt. L knee strength 4-/5 in flexion and extension.                 Plan - 09/12/15 1116    Clinical Impression Statement Pt discouraged about having to wait until August to see rheumatologist. Pt reporting more global sense of discomfort/stiffness/heaviness vs pain today  affecting both UE's & LE's with intensity 6/10 at L knee, with pt thinking the change in weather is somewhat to blame. Able to tolerate all exercises without c/o increased pain/discomfort, but given more global c/o dscomfort and feeling of increased swelling, initiated trial of kinesiotaping to B knees.   PT Treatment/Interventions ADLs/Self Care Home Management;Electrical Stimulation;Iontophoresis 4mg /ml Dexamethasone;Moist Heat;Therapeutic exercise;Therapeutic activities;Functional mobility training;Stair training;Gait training;Ultrasound;Balance training;Neuromuscular re-education;Patient/family education;Manual techniques;Taping;Cryotherapy;Vasopneumatic Device   PT Next Visit Plan Assess reponse to taping, Add exercises as tolerated, B LE stretching, iontophoresis patch (#6 of 6) PRN.    Consulted and Agree with Plan of Care Patient      Patient will benefit from skilled therapeutic intervention in  order to improve the following deficits and impairments:  Abnormal gait, Decreased activity tolerance, Decreased mobility, Decreased range of motion, Difficulty walking, Decreased strength, Impaired flexibility, Pain, Increased muscle spasms  Visit Diagnosis: Pain in right knee  Difficulty in walking, not elsewhere classified  Pain in left knee  Stiffness of right knee, not elsewhere classified     Problem List Patient Active Problem List   Diagnosis Date Noted  . Anal pruritus 02/19/2015  . Panic attack 07/19/2014  . Microcytic anemia 05/02/2014  . Leucocytosis 05/02/2014  . Pruritic rash 04/24/2014  . Bilateral knee pain 03/20/2014  . Healthcare maintenance 02/25/2014  . HTN (hypertension) 02/07/2014  . Baker's cyst of knee 07/20/2013    07/22/2013, PT, MPT 09/12/2015, 12:04 PM  [facility] [address]  [address] [place], [place], [place] Phone: [phone]   Fax:  [phone]  Name: Vicki Cook MRN: Gretchen Portela Date of Birth: 08/08/1960

## 2015-09-16 ENCOUNTER — Ambulatory Visit: Payer: No Typology Code available for payment source

## 2015-09-16 DIAGNOSIS — M25561 Pain in right knee: Secondary | ICD-10-CM

## 2015-09-16 DIAGNOSIS — M25661 Stiffness of right knee, not elsewhere classified: Secondary | ICD-10-CM

## 2015-09-16 DIAGNOSIS — M25562 Pain in left knee: Secondary | ICD-10-CM

## 2015-09-16 DIAGNOSIS — R262 Difficulty in walking, not elsewhere classified: Secondary | ICD-10-CM

## 2015-09-16 NOTE — Therapy (Signed)
[facility] [address]  [address] [place], [place], [address] Phone: [phone]   Fax:  [phone]  Physical Therapy Treatment  Patient Details  Name: Vicki Cook MRN: [id number] Date of Birth: 1960/07/10 Referring Provider: Pearletha Forge   Encounter Date: 09/16/2015      PT End of Session - 09/16/15 1027    Visit Number 14   Number of Visits 16   Date for PT Re-Evaluation 09/28/15   PT Start Time 1019   PT Stop Time 1100   PT Time Calculation (min) 41 min   Activity Tolerance Patient tolerated treatment well   Behavior During Therapy [facility] for tasks assessed/performed      Past Medical History  Diagnosis Date  . Hypertension   . H/O pinworm infection     History reviewed. No pertinent past surgical history.  There were no vitals filed for this visit.      Subjective Assessment - 09/16/15 1022    Subjective Pt. reports pain free in both knees today with L LE "heaviness".     Patient Stated Goals have less pain   Currently in Pain? No/denies   Pain Score 0-No pain   Multiple Pain Sites No     Today Treatment:  Therex: NuStep: level 5, 4 min B HS, glute, SKTC stretch x 30 sec each Hooklying B hip abd/ER with blue TB x 15 reps B sideling clam shell with blue TB x 15 each side Resisted gait backward x 1 lap; pt. with no pain only instability  Hs + bridge with heels on peanut p-ball x 10 reps HS curl with heels on peanut p-ball x 10 reps  Stairs navigation assessment: Pt. ble to ascend stairs with step-over-step and 1 rail use without pain; pt. only able to descend stairs with diagonal step-to pattern        PT Short Term Goals - 09/09/15 1207    PT SHORT TERM GOAL #1   Title independent with initial HEP   Time 2   Period Weeks   Status Achieved  09/09/15: Pt. independent with current HEP.            PT Long Term Goals - 09/16/15 1037    PT LONG TERM GOAL #1   Title report decreased pain  50% (09/28/15)   Status Achieved   PT LONG TERM GOAL #2   Title able to ascend and descend stairs step over step (09/28/15)   Status On-going  09/16/15: Pt. only able to ascend / descend stairs with step-to pattern at this time; pt. more comfortable turning sideways with descending stairs; pt. L quad / B hip instability with step-to descending with B UE support on handrail.     PT LONG TERM GOAL #3   Title increase AROM of the left knee to 10-110 degrees flexion (09/28/15)   Status Achieved   PT LONG TERM GOAL #4   Title increase strength of the left knee to 4/5 (09/28/15)   Status On-going  08/27/15: Pt. L knee strength 4-/5 in flexion and extension.                 Plan - 09/16/15 1035    Clinical Impression Statement Pt. with only heaviness reported in L LE today being pain free in B knees; pt. very pleased with this however reported taping applied to R knee last treatment irritated skin and became itchy; taping not reapplied in today's treatment.  Pt. tolerated all hip /  knee strengthening activity today well with addition of backwards resisted gait.  Pt. still anxious on stair navigation unable to descend stairs reiprocally; pt. continues with diagonal step-to descent pattern with rail dependence.       PT Treatment/Interventions ADLs/Self Care Home Management;Electrical Stimulation;Iontophoresis 4mg /ml Dexamethasone;Moist Heat;Therapeutic exercise;Therapeutic activities;Functional mobility training;Stair training;Gait training;Ultrasound;Balance training;Neuromuscular re-education;Patient/family education;Manual techniques;Taping;Cryotherapy;Vasopneumatic Device   PT Next Visit Plan No taping to knee due to adverse skin reaction with rash/itch.  Add exercises as tolerated, B LE stretching, iontophoresis patch (#6 of 6) PRN.       Patient will benefit from skilled therapeutic intervention in order to improve the following deficits and impairments:  Abnormal gait, Decreased activity  tolerance, Decreased mobility, Decreased range of motion, Difficulty walking, Decreased strength, Impaired flexibility, Pain, Increased muscle spasms  Visit Diagnosis: Pain in right knee  Difficulty in walking, not elsewhere classified  Pain in left knee  Stiffness of right knee, not elsewhere classified     Problem List Patient Active Problem List   Diagnosis Date Noted  . Anal pruritus 02/19/2015  . Panic attack 07/19/2014  . Microcytic anemia 05/02/2014  . Leucocytosis 05/02/2014  . Pruritic rash 04/24/2014  . Bilateral knee pain 03/20/2014  . Healthcare maintenance 02/25/2014  . HTN (hypertension) 02/07/2014  . Baker's cyst of knee 07/20/2013    07/22/2013, PTA 09/16/2015, 12:53 PM  [facility] [address]  [address] [place], [place], [place] Phone: [phone]   Fax:  [phone]  Name: TAMMIE YANDA MRN: Gretchen Portela Date of Birth: 1960-10-17

## 2015-09-19 ENCOUNTER — Ambulatory Visit: Payer: No Typology Code available for payment source

## 2015-09-19 DIAGNOSIS — M25562 Pain in left knee: Secondary | ICD-10-CM

## 2015-09-19 DIAGNOSIS — M25561 Pain in right knee: Secondary | ICD-10-CM

## 2015-09-19 DIAGNOSIS — M25661 Stiffness of right knee, not elsewhere classified: Secondary | ICD-10-CM

## 2015-09-19 DIAGNOSIS — R262 Difficulty in walking, not elsewhere classified: Secondary | ICD-10-CM

## 2015-09-19 NOTE — Therapy (Signed)
[facility] [place] [address]  [address] [place], [place], [address] Phone: [phone]   Fax:  [phone]  Physical Therapy Treatment  Patient Details  Name: Vicki Cook MRN: [id number] Date of Birth: 06/22/1960 Referring Provider: Hudnall   Encounter Date: 09/19/2015      PT End of Session - 09/19/15 1031    Visit Number 15   Number of Visits 16   Date for PT Re-Evaluation 09/28/15   PT Start Time 1021   PT Stop Time 1101   PT Time Calculation (min) 40 min   Activity Tolerance Patient tolerated treatment well   Behavior During Therapy [facility] for tasks assessed/performed      Past Medical History  Diagnosis Date  . Hypertension   . H/O pinworm infection     History reviewed. No pertinent past surgical history.  There were no vitals filed for this visit.      Subjective Assessment - 09/19/15 1028    Subjective Pt. reports pain free in both knees today with L LE "heaviness".     Patient Stated Goals have less pain   Currently in Pain? Yes   Pain Score 2    Pain Location Knee   Pain Orientation Left   Pain Descriptors / Indicators Aching   Pain Type Chronic pain   Pain Radiating Towards n/a   Pain Onset More than a month ago   Pain Frequency Constant   Aggravating Factors  walking, stairs   Multiple Pain Sites No      Today's treatment:  Therex: NuStep: level 5, 5 min  Bridge with adduction ball squeeze x 10 reps  Bridge x 15 reps  Alternating step up on 6" step x 10 reps each way  Alternating step up on 8" step x 10 reps each way   Goals assessment  Stair assessment  Ionto Patch (#6 of 6) to L medial knee inferior to patellar tendon - Dexamethasone 1 mL, 80 mA-min, 4-6 hr wear time         PT Short Term Goals - 09/09/15 1207    PT SHORT TERM GOAL #1   Title independent with initial HEP   Time 2   Period Weeks   Status Achieved  09/09/15: Pt. independent with current HEP.             PT Long Term Goals - 09/19/15 1040    PT LONG TERM GOAL #1   Title report decreased pain 50% (09/28/15)   Status Achieved   PT LONG TERM GOAL #2   Title able to ascend and descend stairs step over step (09/28/15)   Status Partially Met  09/19/15: Pt. able to ascend / descend stairs with step-through pattern; pt. more comfortable turning diagonally with descending stairs; pt. L quad / B hip instability with step-through descending with 1 UE support on rail (moderate)     PT LONG TERM GOAL #3   Title increase AROM of the left knee to 10-110 degrees flexion (09/28/15)   Status Achieved   PT LONG TERM GOAL #4   Title increase strength of the left knee to 4/5 (09/28/15)   Status On-going  09/19/15: Pt. L knee strength 4/5 in flexion and extension.                 Plan - 09/19/15 1231    Clinical Impression Statement Pt. tolerated all hip / knee strengthening and stepping activity very well today with only mild L   knee pain increase from baseline 2/10 pain.  Pt. able to navigate stairs with reciprocal step-through pattern with ascending / descending stairs however continues to demo some L quad and B hip weakness and moderate rail use.     PT Treatment/Interventions ADLs/Self Care Home Management;Electrical Stimulation;Iontophoresis 4mg/ml Dexamethasone;Moist Heat;Therapeutic exercise;Therapeutic activities;Functional mobility training;Stair training;Gait training;Ultrasound;Balance training;Neuromuscular re-education;Patient/family education;Manual techniques;Taping;Cryotherapy;Vasopneumatic Device   PT Next Visit Plan d/c pending PT assessment of pt. goals status.      Patient will benefit from skilled therapeutic intervention in order to improve the following deficits and impairments:  Abnormal gait, Decreased activity tolerance, Decreased mobility, Decreased range of motion, Difficulty walking, Decreased strength, Impaired flexibility, Pain, Increased muscle spasms  Visit Diagnosis: Pain  in right knee  Difficulty in walking, not elsewhere classified  Pain in left knee  Stiffness of right knee, not elsewhere classified     Problem List Patient Active Problem List   Diagnosis Date Noted  . Anal pruritus 02/19/2015  . Panic attack 07/19/2014  . Microcytic anemia 05/02/2014  . Leucocytosis 05/02/2014  . Pruritic rash 04/24/2014  . Bilateral knee pain 03/20/2014  . Healthcare maintenance 02/25/2014  . HTN (hypertension) 02/07/2014  . Baker's cyst of knee 07/20/2013     , PTA 09/19/2015, 12:37 PM  [facility] [place] [address]  [address] [place], [place], [address] Phone: [phone]   Fax:  [phone]  Name: Vicki Cook MRN: [id number] Date of Birth: 04/25/1961    

## 2015-09-23 ENCOUNTER — Ambulatory Visit: Payer: No Typology Code available for payment source | Admitting: Physical Therapy

## 2015-09-23 DIAGNOSIS — M25661 Stiffness of right knee, not elsewhere classified: Secondary | ICD-10-CM

## 2015-09-23 DIAGNOSIS — M25561 Pain in right knee: Secondary | ICD-10-CM

## 2015-09-23 DIAGNOSIS — R262 Difficulty in walking, not elsewhere classified: Secondary | ICD-10-CM

## 2015-09-23 DIAGNOSIS — M25562 Pain in left knee: Secondary | ICD-10-CM

## 2015-09-23 DIAGNOSIS — M25662 Stiffness of left knee, not elsewhere classified: Secondary | ICD-10-CM

## 2015-09-23 NOTE — Therapy (Signed)
[facility] [address]  [place], [place], [address] Phone: [phone]   Fax:  [phone]  Physical Therapy Treatment  Patient Details  Name: Vicki Cook MRN: [id number] Date of Birth: 1960-07-23 Referring Provider: Barbaraann Barthel   Encounter Date: 09/23/2015      PT End of Session - 09/23/15 1107    Visit Number 16   Number of Visits 16   Date for PT Re-Evaluation 09/28/15   PT Start Time 1022   PT Stop Time 1107   PT Time Calculation (min) 45 min   Activity Tolerance Patient tolerated treatment well   Behavior During Therapy [facility] for tasks assessed/performed      Past Medical History  Diagnosis Date  . Hypertension   . H/O pinworm infection     No past surgical history on file.  There were no vitals filed for this visit.      Subjective Assessment - 09/23/15 1031    Subjective Pt reports she had a skin reaction to the taping attempted the visit before last with itching and sacttered very small petechia-like openings (<1 mm). Continues to note worsening of pain all over (currently worse in UE's than LE's) with changes in the weather, with pain increased yesterday due to rainy weather.   Currently in Pain? No/denies  No pain at rest today; but up to 2/10 on R & 3/10 on L with st to stand transitions, no pain with walking.            [facility] PT Assessment - 09/23/15 1022    Assessment   Medical Diagnosis bilateral knee pain   Next MD Visit sometime in June   Observation/Other Assessments   Focus on Therapeutic Outcomes (FOTO)  Knee 51% (49% limitation)   ROM / Strength   AROM / PROM / Strength AROM;Strength   AROM   AROM Assessment Site Knee   Right Knee Extension 1  4 dg in LAQ   Right Knee Flexion 116   Left Knee Extension 0  2 dg in LAQ   Left Knee Flexion 119   Strength   Strength Assessment Site Knee   Right Knee Flexion 4+/5   Right Knee Extension 4+/5   Left Knee Flexion 4/5    Left Knee Extension 4/5   Ambulation/Gait   Stairs Yes   Stair Management Technique One rail Right;Alternating pattern;Step to pattern   Number of Stairs 8   Gait Comments Pt able to ascend stairs reciprocally but with increased effort required when leading with L. Pt continues to require step-to pattern leading with L foot on descent due to L eccentric quad weakness.          Today's Treatment  TherEx NuStep - lvl 5 x 5' Review of HEP addressing pt's questions & concerns   ROM/MMT  Stair assessment   Goal assessment          PT Short Term Goals - 09/09/15 1207    PT SHORT TERM GOAL #1   Title independent with initial HEP   Time 2   Period Weeks   Status Achieved  09/09/15: Pt. independent with current HEP.            PT Long Term Goals - 09/23/15 1039    PT LONG TERM GOAL #1   Title report decreased pain 50% (09/28/15)   Status Achieved   PT LONG TERM GOAL #2   Title able to ascend and descend stairs step  over step (09/28/15)   Status Partially Met  Pt able to ascend stairs reciprocally but with increased effort required when leading with L. Pt continues to require step-to pattern leading with L foot on descent due to L eccentric quad weakness.   PT LONG TERM GOAL #3   Title increase AROM of the left knee to 10-110 degrees flexion (09/28/15)   Status Achieved   PT LONG TERM GOAL #4   Title increase strength of the left knee to 4/5 (09/28/15)   Status Achieved               Plan - 09/23/15 1419    Clinical Impression Statement Pt has demonstrated good progress with PT with improved B knee ROM (R 1-116, L 0-119). Strength has improved to 4+/5 in R knee and 4/5 in L. Pt reports still experiences some occasional mild pain (up to 2-3/10) during sit to stand transitions or when more active. Pt currently ambulating with normal gait pattern without AD, but does note occasional catch with associated sharp pain which may linger for a while after onset. She is  able to ascend stairs reciprocally but increased effort required on L and continues to be inconsistent with reciprocal descent primarily due to eccentric L quad weakness. Pt is aware of need to continue with HEP and may benefit from further PT once she is seen by rheumatologist. All goals met for this episode except stair goal only partially met and pt appears to have reached a plateau due to impact of presumed RA, therefore will proceed with discharge from PT for this episode.   PT Treatment/Interventions ADLs/Self Care Home Management;Electrical Stimulation;Iontophoresis 12m/ml Dexamethasone;Moist Heat;Therapeutic exercise;Therapeutic activities;Functional mobility training;Stair training;Gait training;Ultrasound;Balance training;Neuromuscular re-education;Patient/family education;Manual techniques;Taping;Cryotherapy;Vasopneumatic Device   PT Next Visit Plan Discharge for current PT episode; may benefit from further PT once seen by rheumatologist in August   Consulted and Agree with Plan of Care Patient      Patient will benefit from skilled therapeutic intervention in order to improve the following deficits and impairments:  Abnormal gait, Decreased activity tolerance, Decreased mobility, Decreased range of motion, Difficulty walking, Decreased strength, Impaired flexibility, Pain, Increased muscle spasms  Visit Diagnosis: Pain in right knee  Difficulty in walking, not elsewhere classified  Pain in left knee  Stiffness of right knee, not elsewhere classified  Stiffness of left knee, not elsewhere classified     Problem List Patient Active Problem List   Diagnosis Date Noted  . Anal pruritus 02/19/2015  . Panic attack 07/19/2014  . Microcytic anemia 05/02/2014  . Leucocytosis 05/02/2014  . Pruritic rash 04/24/2014  . Bilateral knee pain 03/20/2014  . Healthcare maintenance 02/25/2014  . HTN (hypertension) 02/07/2014  . Baker's cyst of knee 07/20/2013    JPercival Spanish PT,  MPT 09/23/2015, 2:27 PM  [facility] [place] [address]Phone: [phone]  Fax:  [phone] Name: Vicki UNGARMRN: [id number] of Birth: [birth year]  PHYSICAL THERAPY DISCHARGE SUMMARY  Visits from Start of Care: 16  Current functional level related to goals / functional outcomes:   Pt has demonstrated good progress with PT with improved B knee ROM (R 1-116, L 0-119). Strength has improved to 4+/5 in R knee and 4/5 in L. Pt reports still experiences some occasional mild pain (up to 2-3/10) during sit to stand transitions or when more active. Pt currently ambulating with normal gait pattern without AD,  but does note occasional catch with associated sharp pain which may linger for a while after onset. She is able to ascend stairs reciprocally but increased effort required on L and continues to be inconsistent with reciprocal descent primarily due to eccentric L quad weakness. Pt is aware of need to continue with HEP and may benefit from further PT once she is seen by rheumatologist. All goals met for this episode except stair goal only partially met and pt appears to have reached a plateau due to impact of presumed RA, therefore will proceed with discharge from PT for this episode.   Remaining deficits:   Intermittent knee pain, sometimes associated with changes in weather; intermittent difficulty with stair descent   Education / Equipment:   HEP  Plan: Patient agrees to discharge.  Patient goals were partially met. Patient is being discharged due to being pleased with the current functional level.  ?????        Percival Spanish, PT, MPT 09/23/2015, 2:27 PM  [facility] [address]  [place], [place], [address] Phone: [phone]   Fax:  [phone]

## 2015-09-26 ENCOUNTER — Ambulatory Visit: Payer: No Typology Code available for payment source

## 2015-10-24 ENCOUNTER — Other Ambulatory Visit (HOSPITAL_BASED_OUTPATIENT_CLINIC_OR_DEPARTMENT_OTHER): Payer: Self-pay | Admitting: Rheumatology

## 2015-10-24 ENCOUNTER — Ambulatory Visit (HOSPITAL_BASED_OUTPATIENT_CLINIC_OR_DEPARTMENT_OTHER)
Admission: RE | Admit: 2015-10-24 | Discharge: 2015-10-24 | Disposition: A | Discharge status: Home / Self Care | Payer: No Typology Code available for payment source | Source: Ambulatory Visit | Attending: Rheumatology | Admitting: Rheumatology

## 2015-10-24 DIAGNOSIS — M069 Rheumatoid arthritis, unspecified: Secondary | ICD-10-CM

## 2015-10-24 DIAGNOSIS — M79641 Pain in right hand: Secondary | ICD-10-CM | POA: Insufficient documentation

## 2015-10-27 ENCOUNTER — Other Ambulatory Visit: Payer: No Typology Code available for payment source

## 2015-10-29 ENCOUNTER — Telehealth: Payer: Self-pay | Admitting: Family Medicine

## 2015-11-03 ENCOUNTER — Encounter: Payer: Self-pay | Admitting: *Deleted

## 2015-11-03 NOTE — Progress Notes (Unsigned)
Patient ID: TRANG BOUSE, female   DOB: [birth year], 55 y.o.   MRN: [id number]  Ms. Veltri presented last week with lab orders from [facility], Dr Estanislado Pandy. She was referred to [facility] by Dr Barbaraann Barthel, Sports Medicine.  Multiple labs have been requested  Ms. Lenhardt has a current [facility] card  Per Dr. Lynnae January, she would like to review office notes from [facility] before blood is drawn by the [facility] lab.  We may be able to use some labs results currently on the patient's [place] chart.  Ms. Acrey needs to sign a release of information so we can receive records from [employer].  Sherrie George, CMA from Sports Medicine will notify the patient that she needs to sign the release. (Per Nevin Bloodgood 11-03-2015 1430p)  Maryan Rued, PBT Internal Medicine Clinic Lab 07=03=2017 1447p

## 2015-11-07 ENCOUNTER — Other Ambulatory Visit: Payer: Self-pay | Admitting: Internal Medicine

## 2015-11-07 DIAGNOSIS — M7120 Synovial cyst of popliteal space [Baker], unspecified knee: Secondary | ICD-10-CM

## 2015-11-07 DIAGNOSIS — M25561 Pain in right knee: Secondary | ICD-10-CM

## 2015-11-07 DIAGNOSIS — M25562 Pain in left knee: Secondary | ICD-10-CM

## 2015-11-11 ENCOUNTER — Other Ambulatory Visit (INDEPENDENT_AMBULATORY_CARE_PROVIDER_SITE_OTHER): Payer: No Typology Code available for payment source

## 2015-11-11 DIAGNOSIS — M25561 Pain in right knee: Secondary | ICD-10-CM

## 2015-11-11 DIAGNOSIS — M25562 Pain in left knee: Secondary | ICD-10-CM

## 2015-11-11 DIAGNOSIS — M2550 Pain in unspecified joint: Secondary | ICD-10-CM

## 2015-11-11 DIAGNOSIS — M7120 Synovial cyst of popliteal space [Baker], unspecified knee: Secondary | ICD-10-CM

## 2015-11-12 LAB — CBC
Hematocrit: 33.6 % — ABNORMAL LOW (ref 34.0–46.6)
Hemoglobin: 11.1 g/dL (ref 11.1–15.9)
MCH: 25.1 pg — ABNORMAL LOW (ref 26.6–33.0)
MCHC: 33 g/dL (ref 31.5–35.7)
MCV: 76 fL — ABNORMAL LOW (ref 79–97)
PLATELETS: 381 10*3/uL — AB (ref 150–379)
RBC: 4.43 x10E6/uL (ref 3.77–5.28)
RDW: 15.4 % (ref 12.3–15.4)
WBC: 10 10*3/uL (ref 3.4–10.8)

## 2015-11-12 LAB — MICROSCOPIC EXAMINATION
CASTS: NONE SEEN /LPF
Epithelial Cells (non renal): NONE SEEN /hpf (ref 0–10)

## 2015-11-12 LAB — CMP14 + ANION GAP
ALK PHOS: 86 IU/L (ref 39–117)
ALT: 15 IU/L (ref 0–32)
ANION GAP: 15 mmol/L (ref 10.0–18.0)
AST: 19 IU/L (ref 0–40)
Albumin/Globulin Ratio: 0.9 — ABNORMAL LOW (ref 1.2–2.2)
Albumin: 3.9 g/dL (ref 3.5–5.5)
BILIRUBIN TOTAL: 0.2 mg/dL (ref 0.0–1.2)
BUN / CREAT RATIO: 18 (ref 9–23)
BUN: 10 mg/dL (ref 6–24)
CHLORIDE: 100 mmol/L (ref 96–106)
CO2: 24 mmol/L (ref 18–29)
CREATININE: 0.56 mg/dL — AB (ref 0.57–1.00)
Calcium: 9.1 mg/dL (ref 8.7–10.2)
GFR calc Af Amer: 123 mL/min/{1.73_m2} (ref >59)
GFR calc non Af Amer: 107 mL/min/{1.73_m2} (ref >59)
Globulin, Total: 4.5 g/dL (ref 1.5–4.5)
Glucose: 77 mg/dL (ref 65–99)
Potassium: 4.1 mmol/L (ref 3.5–5.2)
SODIUM: 139 mmol/L (ref 134–144)
Total Protein: 8.4 g/dL (ref 6.0–8.5)

## 2015-11-12 LAB — URINALYSIS, ROUTINE W REFLEX MICROSCOPIC
BILIRUBIN UA: NEGATIVE
GLUCOSE, UA: NEGATIVE
KETONES UA: NEGATIVE
LEUKOCYTES UA: NEGATIVE
NITRITE UA: NEGATIVE
Protein, UA: NEGATIVE
SPEC GRAV UA: 1.007 (ref 1.005–1.030)
Urobilinogen, Ur: 0.2 mg/dL (ref 0.2–1.0)
pH, UA: 6.5 (ref 5.0–7.5)

## 2015-11-12 LAB — IGG, IGA, IGM
IGG (IMMUNOGLOBIN G), SERUM: 2384 mg/dL — AB (ref 700–1600)
IgA/Immunoglobulin A, Serum: 457 mg/dL — ABNORMAL HIGH (ref 87–352)
IgM (Immunoglobulin M), Srm: 155 mg/dL (ref 26–217)

## 2015-11-12 LAB — SEDIMENTATION RATE: Sed Rate: 61 mm/hr — ABNORMAL HIGH (ref 0–40)

## 2015-11-12 LAB — RHEUMATOID FACTOR: Rhuematoid fact SerPl-aCnc: 22.9 IU/mL — ABNORMAL HIGH (ref 0.0–13.9)

## 2015-11-13 LAB — QUANTIFERON IN TUBE
QFT TB AG MINUS NIL VALUE: <0 IU/mL
QUANTIFERON MITOGEN VALUE: 4.31 IU/mL
QUANTIFERON TB AG VALUE: 0.03 IU/mL
QUANTIFERON TB GOLD: NEGATIVE
Quantiferon Nil Value: 0.04 IU/mL

## 2015-11-13 LAB — ANTINUCLEAR ANTIBODIES, IFA: ANA Titer 1: POSITIVE — AB

## 2015-11-13 LAB — QUANTIFERON TB GOLD ASSAY (BLOOD)

## 2015-11-13 LAB — FANA STAINING PATTERNS: Homogeneous Pattern: 1:80 {titer}

## 2015-11-13 LAB — CYCLIC CITRUL PEPTIDE ANTIBODY, IGG/IGA: Cyclic Citrullin Peptide Ab: >250 U — ABNORMAL HIGH (ref 0–19)

## 2015-11-13 LAB — GLUCOSE 6 PHOSPHATE DEHYDROGENASE: G-6-PD, QUANT: 10 U/g{Hb} (ref 4.6–13.5)

## 2015-11-14 MED FILL — FOLIC ACID 1 MG TABLET: 1 | 90 days supply | Qty: 180 | Fill #0

## 2015-11-14 MED FILL — METHOTREXATE 2.5 MG TABLET: 2.5 | 14 days supply | Qty: 8 | Fill #0

## 2015-11-14 MED FILL — predniSONE 5 MG TABS: 5 | 20 days supply | Qty: 42 | Fill #0

## 2015-11-17 LAB — SPECIMEN STATUS REPORT

## 2015-11-17 NOTE — Addendum Note (Signed)
Addended by: Bufford Spikes on: 11/17/2015 09:40 AM   Modules accepted: Orders

## 2015-11-17 NOTE — Progress Notes (Signed)
RE: Labs collected 11-11-15 per Dr Arlean Hopping request  Lab results were faxed to Dr Estanislado Pandy, [place] at [phone], 11-17-2015 at 11:15 am.  The SPEP is the only result pending. I will fax this report as soon as it is final.  Maryan Rued, PBT Clinic Lab

## 2015-11-18 LAB — PROTEIN ELECTROPHORESIS, SERUM, WITH REFLEX
A/G RATIO SPE: 0.6 — AB (ref 0.7–1.7)
ALBUMIN ELP: 3.1 g/dL (ref 2.9–4.4)
ALPHA 1: 0.3 g/dL (ref 0.0–0.4)
Alpha 2: 0.9 g/dL (ref 0.4–1.0)
Beta: 1.2 g/dL (ref 0.7–1.3)
Gamma Globulin: 2.5 g/dL — ABNORMAL HIGH (ref 0.4–1.8)
Globulin, Total: 5 g/dL — ABNORMAL HIGH (ref 2.2–3.9)
TOTAL PROTEIN: 8.1 g/dL (ref 6.0–8.5)

## 2015-11-19 MED FILL — AMLODIPINE BESYLATE 10 MG T: 10 | 90 days supply | Qty: 90 | Fill #3

## 2015-11-19 NOTE — Progress Notes (Signed)
Serum Protein Electrophoresis - final results- faxed to Dr. Arlean Hopping, [place] at [phone] Wednesday, 11-19-2015, 15:07 pm  Maryan Rued, PBT Clinic Lab

## 2015-11-20 LAB — SPECIMEN STATUS REPORT

## 2015-12-16 ENCOUNTER — Telehealth: Payer: Self-pay | Admitting: *Deleted

## 2016-01-08 NOTE — Telephone Encounter (Signed)
Finished

## 2016-01-27 ENCOUNTER — Telehealth: Payer: Self-pay | Admitting: Internal Medicine

## 2016-01-27 NOTE — Telephone Encounter (Signed)
REMINDER CALL IT IS TIME TO RENEW GCCN CARD, LMTCB

## 2016-03-02 ENCOUNTER — Other Ambulatory Visit: Payer: Self-pay | Admitting: Internal Medicine

## 2016-03-02 NOTE — Telephone Encounter (Signed)
Refill request sent to pcp.  Notice also sent to front office to schedule next avail appt w/pcp.Vicki Spittle Cassady10/31/20173:16 PM

## 2016-03-04 MED FILL — AMLODIPINE BESYLATE 10 MG T: 10 | 90 days supply | Qty: 90 | Fill #0

## 2016-04-07 ENCOUNTER — Encounter: Payer: Self-pay | Admitting: Internal Medicine

## 2016-04-20 ENCOUNTER — Telehealth: Payer: Self-pay | Admitting: Internal Medicine

## 2016-04-20 NOTE — Telephone Encounter (Signed)
APT. REMINDER CALL, LMTCB °

## 2016-04-21 ENCOUNTER — Encounter: Payer: Self-pay | Admitting: Internal Medicine

## 2016-07-14 MED FILL — AMLODIPINE BESYLATE 10 MG T: 10 | 90 days supply | Qty: 90 | Fill #1

## 2016-07-16 ENCOUNTER — Ambulatory Visit: Payer: Self-pay

## 2017-02-07 ENCOUNTER — Telehealth: Payer: Self-pay | Admitting: Internal Medicine

## 2017-02-07 ENCOUNTER — Telehealth: Payer: Self-pay | Admitting: Family Medicine

## 2017-02-07 NOTE — Telephone Encounter (Signed)
Patient has been summoned for [employer] on November 26th.  Patient called to see if she could get a letter excusing her due to her arthritis in both knees and cysts

## 2017-02-07 NOTE — Telephone Encounter (Signed)
Those would not be reasons to be excused for jury duty.

## 2017-02-07 NOTE — Telephone Encounter (Signed)
Patient informed. 

## 2017-02-09 ENCOUNTER — Ambulatory Visit: Payer: Self-pay | Admitting: Internal Medicine

## 2017-02-11 ENCOUNTER — Ambulatory Visit: Payer: Self-pay | Admitting: Internal Medicine

## 2017-05-17 NOTE — Telephone Encounter (Signed)
Error

## 2017-10-26 ENCOUNTER — Encounter: Payer: Self-pay | Admitting: Internal Medicine

## 2017-10-26 ENCOUNTER — Ambulatory Visit: Payer: Self-pay | Admitting: Internal Medicine

## 2017-10-26 VITALS — BP 150/102 | HR 78 | Resp 12 | Ht <= 58 in | Wt 137.0 lb

## 2017-10-26 DIAGNOSIS — F410 Panic disorder [episodic paroxysmal anxiety] without agoraphobia: Secondary | ICD-10-CM

## 2017-10-26 DIAGNOSIS — I10 Essential (primary) hypertension: Secondary | ICD-10-CM

## 2017-10-26 DIAGNOSIS — L309 Dermatitis, unspecified: Secondary | ICD-10-CM

## 2017-10-26 DIAGNOSIS — M05761 Rheumatoid arthritis with rheumatoid factor of right knee without organ or systems involvement: Secondary | ICD-10-CM

## 2017-10-26 DIAGNOSIS — M05762 Rheumatoid arthritis with rheumatoid factor of left knee without organ or systems involvement: Secondary | ICD-10-CM

## 2017-10-26 MED ORDER — AMLODIPINE BESYLATE 5 MG PO TABS: ORAL_TABLET | ORAL | 11 refills | Status: DC

## 2017-10-26 MED ORDER — PREDNISONE 20 MG PO TABS: ORAL_TABLET | ORAL | 0 refills | Status: DC

## 2017-10-26 MED ORDER — FEXOFENADINE HCL 180 MG PO TABS: ORAL_TABLET | ORAL | 11 refills | Status: DC

## 2017-10-26 MED ORDER — TRIAMCINOLONE ACETONIDE 0.1 % EX OINT: TOPICAL_OINTMENT | CUTANEOUS | 3 refills | Status: DC

## 2017-10-26 NOTE — Patient Instructions (Signed)
Let me know when you change your mind about going back to a Rheumatologist to treat your arthritis

## 2017-10-26 NOTE — Progress Notes (Signed)
Subjective:    Patient ID: Vicki Cook, female    DOB: [birth year], 57 y.o.   MRN: [id number]  HPI  Here to reestablish  Was seen in our clinic when a volunteer clinic.  Last visit 10/02/2013  1.  Essential Hypertension:  Diagnosed probably in 2013.  She was taking HCTZ, which did not work well.   Switched to Metoprolol 25 mg twice daily. States it worked well for her. She had some joint issues and started going to [facility] where she was switched to  Amlodipine 10 mg, which she also states worked well for her.  She lost 35 lbs with lifestyle changes and states her blood pressure was improved.   She was monitoring her bp at home and decreased her amlodipine to 5 mg daily and then stopped about 1 year ago. She states her blood pressure was "normal" at 170 to 180 over 80, so she stopped her Amlodipine.   Recently, she rechecked on a home monitor she purchased her bp was similar to what we found today.    2.  Rash:  History of eczema on neck, treated with triamcinolone cream with improvement.  Also now with rash that started on her arms once the weather warmed.  She states that since they got their [facility] fixed, her rash is improved.   Rash is itchy only if very hot. She is not clear if she has ever had a rash similar in the past.   Last year, had similar rash on chest She does not have a rash on her belt area or thighs.  Sometimes on her anus as well, will have rash.  3.  RA:  Mainly involving knees.  Xrays suggested inflammatory arthopathy and was being followed by sports medicine at [facility].  Ultimately, diagnosed with RA with + ANA-1:80 homogeneous patternm  + RA, + Cyclic Citrullin Peptide.  This was in 11/2015.  She did have symptoms even in 2015, but did not follow up for completion of evaluation. Was followed by Dr. Corliss Skains for a short time.  Took Methotrexate for 1 week and stopped as did not like the list of side effects, though she did not suffer any.    No  outpatient medications have been marked as taking for the 10/26/17 encounter (Office Visit) with Julieanne Manson, MD.    Allergies  Allergen Reactions  . Asa [Aspirin] Nausea And Vomiting and Other (See Comments)    Burning in stomach   . Lisinopril Other (See Comments)    Had pain in her knees     Past Medical History:  Diagnosis Date  . H/O pinworm infection   . Hypertension 2012  . Rheumatoid arthritis (HCC) 2017   History reviewed. No pertinent surgical history.   Family History  Problem Relation Age of Onset  . Heart disease Mother   . Hyperlipidemia Mother   . Hypertension Mother   . Heart disease Father   . Hypertension Father   . Diabetes Father     Social History   Socioeconomic History  . Marital status: Married    Spouse name: Not on file  . Number of children: 2  . Years of education: 89  . Highest education level: Some college, no degree  Occupational History  . Occupation: Comptroller  Social Needs  . Financial resource strain: Not on file  . Food insecurity:    Worry: Not on file    Inability: Not on file  . Transportation  needs:    Medical: Not on file    Non-medical: Not on file  Tobacco Use  . Smoking status: Never Smoker  . Smokeless tobacco: Never Used  Substance and Sexual Activity  . Alcohol use: No    Alcohol/week: 0.0 standard drinks  . Drug use: No  . Sexual activity: Not on file    Comment: has not had sex for several years  Lifestyle  . Physical activity:    Days per week: Not on file    Minutes per session: Not on file  . Stress: Not on file  Relationships  . Social connections:    Talks on phone: Not on file    Gets together: Not on file    Attends religious service: Not on file    Active member of club or organization: Not on file    Attends meetings of clubs or organizations: Not on file    Relationship status: Not on file  . Intimate partner violence:    Fear of current or ex partner: Not on  file    Emotionally abused: Not on file    Physically abused: Not on file    Forced sexual activity: Not on file  Other Topics Concern  . Not on file  Social History Narrative   Originally from    [place]   Has lived in [place]. Since 1999   2 young adult sons   Husband was a professor at [employer], but imprisoned for illegal internet activity.  Patient states he was unfairly targeted.  Tried to work with Suzie Portela to have his conviction overturned.  He was deported after release in recent years.   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        Review of Systems     Objective:   Physical Exam   NAD HEENT: PERRL, EOMI, TMs pearly gray, throat without injection.   Neck:  Supple, No adenopathy Chest:  CTA CV:  RRR with normal S1, S2, No S3, S4 or murmur.  No carotid bruits. Carotid, radial and DP pulses normal and equal Abd:  S, NT, No HSM or mass. + BS Skin:  Right upper eyelid, arms, trunk left upper thigh, antecubital fossae with dry flaky and mildly erythematous patches.  No hand or foot involvement MS:  No significant joint abnormalities today       Assessment & Plan:  1.  Hypertension:  Restart Amlodipine 5 mg daily.   .2 weeks for bp check with nurse.  2. Eczema:  Prednisone burst 20 mg daily for 5 days.  Allegra 180 mg daily.  Triamcinolone cream 0.1% twice daily to affected areas as needed.  3.  RA:  Check old records.  Will discuss DMARDS again at follow up visit in 3 months. 3.  RA:

## 2017-10-28 ENCOUNTER — Telehealth: Payer: Self-pay | Admitting: Licensed Clinical Social Worker

## 2017-10-28 NOTE — Telephone Encounter (Signed)
LCSW called patient in order to introduce self and provide information about social work services available at the clinic. Left Vm.

## 2017-11-11 ENCOUNTER — Ambulatory Visit (INDEPENDENT_AMBULATORY_CARE_PROVIDER_SITE_OTHER): Payer: Self-pay

## 2017-11-11 VITALS — BP 180/100 | HR 78

## 2017-11-11 DIAGNOSIS — I10 Essential (primary) hypertension: Secondary | ICD-10-CM

## 2017-11-11 NOTE — Progress Notes (Signed)
Patient is on amlodipine 5 mg 1 daily. Patient states she has not taken BP medication in last 3 days due to her fast. Informed patient of importance of taking medication regularly so when she comes in for BP checks we can make sure the medication is working for her. Patient verbalized understanding and appointment to recheck BP in 1 week. Patient fasting ends today.

## 2017-11-18 ENCOUNTER — Ambulatory Visit (INDEPENDENT_AMBULATORY_CARE_PROVIDER_SITE_OTHER): Payer: Self-pay | Admitting: Internal Medicine

## 2017-11-18 ENCOUNTER — Telehealth: Payer: Self-pay

## 2017-11-18 VITALS — BP 130/82 | HR 70

## 2017-11-18 DIAGNOSIS — I10 Essential (primary) hypertension: Secondary | ICD-10-CM

## 2017-11-18 NOTE — Telephone Encounter (Signed)
Patient came in for BP check. BP was in normal range. Patient state that the medication is causing her headaches. She is currently taking Amlodipine 5 mg 1 daily. States she takes it at night and wakes up with a headache. Patient would like to know what she should do. States she was not having headaches until she started taking the medicine.  To Dr. Delrae Alfred to advise.

## 2017-11-18 NOTE — Progress Notes (Signed)
Patient BP in normal range. Informed to continue current dose of medication. Patient verbalized understanding. 

## 2017-11-22 NOTE — Telephone Encounter (Signed)
Have her try taking it in the morning instead and see what happens.  Does the headache go away after she is up and about for a while?

## 2017-11-30 NOTE — Telephone Encounter (Signed)
Spoke with patient. States she will try taking medication in the morning. States sometimes the headache goes away and sometimes it stays, but she will try and see what happens and let us know if she continues to have them.

## 2017-12-29 ENCOUNTER — Encounter: Payer: Self-pay | Admitting: Internal Medicine

## 2018-01-30 ENCOUNTER — Ambulatory Visit: Payer: Self-pay | Admitting: Internal Medicine

## 2018-08-07 ENCOUNTER — Ambulatory Visit: Payer: Self-pay | Admitting: Internal Medicine

## 2018-10-12 ENCOUNTER — Ambulatory Visit: Payer: Self-pay | Admitting: Internal Medicine

## 2018-10-12 ENCOUNTER — Other Ambulatory Visit: Payer: Self-pay

## 2018-10-12 DIAGNOSIS — I10 Essential (primary) hypertension: Secondary | ICD-10-CM

## 2018-10-12 DIAGNOSIS — H938X1 Other specified disorders of right ear: Secondary | ICD-10-CM | POA: Insufficient documentation

## 2018-10-12 NOTE — Patient Instructions (Addendum)
It was nice seeing you today. Thank you for choosing [facility] for your Primary Care.   Today we talked about:  1) Ear congestion:   - irrigate your sinuses and inner ear once a day by following the instructions below. There are several products available and online videos that can also be helpful  - If this isn't helping enough, continue daily irrigation and start using Fluticasone nasal spray daily, one spray in each nostril.    FOLLOW-UP INSTRUCTIONS When: next available with PCP For: HTN What to bring: all medications  Please contact the clinic if you have any problems, or need to be seen sooner.   BUFFERED ISOTONIC SALINE NASAL IRRIGATION  The Benefits:  1. When you irrigate, the isotonic saline (salt water) acts as a solvent and washes the mucus crusts and other debris from your nose.  2. This decongests and improves the airflow into your nose. The sinus passages begin to open.  3. Studies have also shown that a salt water and an alkaline (baking soda) irrigation solution improves nasal membrane cell function (mucociliary flow of mucus debris).  The Recipe:  1. Choose a 1-quart glass jar that is thoroughly cleansed.  2. Fill with sterile or distilled water, or you can boil water from the tap.  3. Add 1 to 2 heaping teaspoons of "pickling/canning/sea" salt (NOT table salt as it contains a large number of additives). This salt is available at the grocery store in the food canning section.  4. Add 1 teaspoon of Arm & Hammer Baking Soda (pure bicarbonate).  5. Mix ingredients together and store at room temperature. Discard after one week. If you find this solution too strong, you may decrease the amount of salt added to 1 to 1  teaspoons. With children it is often best to start with a milder solution and advance slowly. Irrigate with 240 ml (8 oz) twice daily.  The Instructions:  You should plan to irrigate your nose with buffered isotonic saline 2 times per  day. Many people prefer to warm the solution slightly in the microwave - but be sure that the solution is NOT HOT. Stand over the sink (some do this in the shower) and squirt the solution into each side of your nose, keeping your mouth open. This allows you to spit the saltwater out of your mouth. It will not harm you if you swallow a little.  If you have been told to use a nasal steroid such as Flonase, Nasonex, or Nasacort, you should always use isortonic saline solution first, then use your nasal steroid product. The nasal steroid is much more effective when sprayed onto clean nasal membranes and the steroid medicine will reach deeper into the nose.  Most people experience a little burning sensation the first few times they use a isotonic saline solution, but this usually goes away within a few days.

## 2018-10-12 NOTE — Assessment & Plan Note (Signed)
Intermittent for 2 months, preceded by URI. No evidence of otitis media or sinusitis on exam. Most likely secondary to blocked eustachian tube or effusion.   - daily sinus irrigation followed by flonase qd - f/u if symptoms do not improve

## 2018-10-12 NOTE — Progress Notes (Signed)
   CC: "hearing blocked sometimes"  HPI:  Vicki Cook is a 58 y.o. F with HTN presenting for evaluation of right ear fullness. This symptom started 2 months ago when she experienced rhinorrhea, cough, post-nasal drip. She took alkaseltzer prn and her URI symptoms resolved, but intermittently over the past 2 months she has experienced fullness in her right ear as if something is blocking it and she can't hear as well. She has tried cleaning her ear out and using debrox ear drops but this hasn't helped. She describes associated neck pain and headache that comes and goes. She denies fevers, sick contacts, n/v/d, cough, rhinorrhea, sneezing.   Past Medical History:  Diagnosis Date  . H/O pinworm infection   . Hypertension 2012  . Rheumatoid arthritis ([place]) 2017    Physical Exam:  Vitals:   10/12/18 0932 10/12/18 0936 10/12/18 0940  BP: (!) 161/99 (!) 146/98 (!) 141/99  Pulse: 80 79 83  Temp: 98.2 F (36.8 C)    TempSrc: Oral    SpO2: 100%    Weight: 144 lb 8 oz (65.5 kg)    Height: 4\' 10"  (1.473 m)     Gen: well appearing NAD HENT: no excoriations or cerumen in the external ear canal, bilateral TMs intact with light reflex, no ear pain, oropharynx clear without exudate, sinuses are non-tender Pulm: CTAB  Assessment & Plan:   See Encounters Tab for problem based charting.  Patient discussed with Dr. Evette Doffing

## 2018-10-13 NOTE — Progress Notes (Signed)
Internal Medicine Clinic Attending  Case discussed with Dr. Vogel  at the time of the visit.  We reviewed the resident's history and exam and pertinent patient test results.  I agree with the assessment, diagnosis, and plan of care documented in the resident's note.  

## 2018-12-06 ENCOUNTER — Other Ambulatory Visit: Payer: Self-pay

## 2018-12-06 ENCOUNTER — Other Ambulatory Visit: Payer: Self-pay | Admitting: Internal Medicine

## 2018-12-06 MED ORDER — AMLODIPINE BESYLATE 5 MG PO TABS: ORAL_TABLET | ORAL | 3 refills | Status: DC

## 2018-12-06 NOTE — Telephone Encounter (Signed)
amLODipine (NORVASC) 5 MG tablet, refill request @  [place], [place]. [phone] (Phone) [phone] (Fax)

## 2018-12-06 NOTE — Telephone Encounter (Signed)
Sounds reasonable. Medication sent to [facility].

## 2018-12-06 NOTE — Telephone Encounter (Signed)
Pt was seen on 6/11 for ear congestion but has not had any lab work since 2017. I called pt - stated she is completely out of medication and she has an appt to see the financial counselor on Monday 8/10 for orange card. She agreed to schedule an [facility] appt on this same day. [facility] appt scheduled @ 1415PM. But asked if she can get enough med refill until her appt on Monday since she's completely out.

## 2018-12-11 ENCOUNTER — Ambulatory Visit: Payer: Self-pay

## 2018-12-11 ENCOUNTER — Other Ambulatory Visit: Payer: Self-pay

## 2018-12-11 ENCOUNTER — Telehealth: Payer: Self-pay | Admitting: *Deleted

## 2018-12-11 ENCOUNTER — Ambulatory Visit (INDEPENDENT_AMBULATORY_CARE_PROVIDER_SITE_OTHER): Payer: Self-pay | Admitting: Internal Medicine

## 2018-12-11 VITALS — BP 177/98 | HR 70 | Temp 98.4°F | Ht <= 58 in | Wt 143.7 lb

## 2018-12-11 DIAGNOSIS — I10 Essential (primary) hypertension: Secondary | ICD-10-CM

## 2018-12-11 DIAGNOSIS — M05761 Rheumatoid arthritis with rheumatoid factor of right knee without organ or systems involvement: Secondary | ICD-10-CM

## 2018-12-11 DIAGNOSIS — L308 Other specified dermatitis: Secondary | ICD-10-CM

## 2018-12-11 DIAGNOSIS — D509 Iron deficiency anemia, unspecified: Secondary | ICD-10-CM

## 2018-12-11 DIAGNOSIS — D892 Hypergammaglobulinemia, unspecified: Secondary | ICD-10-CM

## 2018-12-11 DIAGNOSIS — L309 Dermatitis, unspecified: Secondary | ICD-10-CM | POA: Insufficient documentation

## 2018-12-11 DIAGNOSIS — M059 Rheumatoid arthritis with rheumatoid factor, unspecified: Secondary | ICD-10-CM

## 2018-12-11 DIAGNOSIS — Z79899 Other long term (current) drug therapy: Secondary | ICD-10-CM

## 2018-12-11 DIAGNOSIS — L282 Other prurigo: Secondary | ICD-10-CM

## 2018-12-11 HISTORY — DX: Dermatitis, unspecified: L30.9

## 2018-12-11 MED ORDER — TRIAMCINOLONE ACETONIDE 0.1 % EX OINT: TOPICAL_OINTMENT | CUTANEOUS | 3 refills | Status: AC

## 2018-12-11 MED ORDER — LOSARTAN POTASSIUM 25 MG PO TABS: 25.0000 mg | ORAL_TABLET | Freq: Every day | ORAL | 3 refills | Status: DC

## 2018-12-11 MED FILL — TRIAMCINOLONE 0.1% OINTMENT: 0.1 | 14 days supply | Qty: 80 | Fill #0

## 2018-12-11 MED FILL — LOSARTAN POTASSIUM 25 MG TA: 25 | 30 days supply | Qty: 30 | Fill #0

## 2018-12-11 NOTE — Assessment & Plan Note (Signed)
Rheumatoid arthritis: She was diagnosed with rheumatoid arthritis in 2017 with positive objective findings as she was found to have a positive ANA, positive rheumatoid factor, positive CCP.  Following the diagnosis, she followed up with rheumatology (Dr. Estanislado Pandy) and was started on methotrexate.  She states that she took the medication for about a week and self discontinued due to mucositis.  Since then, she has not followed up with rheumatology.  She states that she has been trying natural remedies for her rheumatoid.  Her only complaint today is left knee pain.  We discussed the importance of following up with rheumatology and also advised her that there are new immunomodulators other than methotrexate that could help with her rheumatoid.  Inability to control her RA can result in joint destruction.  Plan: -Referral to rheumatology has been given

## 2018-12-11 NOTE — Patient Instructions (Addendum)
Ms. Armentor,  It was a pleasure taking care of you in the clinic today.  As we discussed, your blood pressure has been elevated and I would ask for you to discontinue taking the amlodipine and start losartan 25 mg daily.  Also I would advise that you decrease the salt in your food and also do daily exercises.   I have also ordered blood work to check on your kidneys and blood count.  I will prescribe you the triamcinolone cream for the eczema.  I have also given you referral to see dermatologist  I would like to see you back here in the clinic in 1 to 2 weeks for blood pressure recheck  Take Care Dr. Eileen Stanford  Please call the internal medicine center clinic if you have any questions or concerns, we may be able to help and keep you from a long and expensive emergency room wait. Our clinic and after hours phone number is [phone], the best time to call is Monday through Friday 9 am to 4 pm but there is always someone available 24/7 if you have an emergency. If you need medication refills please notify your pharmacy one week in advance and they will send [place] a request.

## 2018-12-11 NOTE — Telephone Encounter (Signed)
LVM FOR PATIENT TOR RETURN CALL TO [phone]   (PATIENT HAS NO INSURANCE COVERAGE.

## 2018-12-11 NOTE — Progress Notes (Signed)
   CC: Follow-up hypertension  HPI:  [name] is a 57 y.o. with medical history listed below presenting to follow-up on hypertension.  Please see problem based charting for further details.  Past Medical History:  Diagnosis Date  . H/O pinworm infection   . Hypertension 2012  . Rheumatoid arthritis ([place]) 2017   Review of Systems: As per HPI  Physical Exam:  Vitals:   12/11/18 1433 12/11/18 1438  BP: (!) 180/94 (!) 177/98  Pulse: 76 70  Temp: 98.4 F (36.9 C)   TempSrc: Oral   SpO2: 100%   Weight: 143 lb 11.2 oz (65.2 kg)   Height: 4\' 10"  (1.473 m)    Physical Exam  Constitutional: She is well-developed, well-nourished, and in no distress.  HENT:  Head: Normocephalic and atraumatic.  Eyes: Conjunctivae are normal.  Cardiovascular: Normal rate and regular rhythm.  No murmur heard. Pulmonary/Chest: Effort normal and breath sounds normal. She has no wheezes. She has no rales.  Musculoskeletal: Normal range of motion.        General: No tenderness or deformity.  Skin: Rash (Eczematous rash on the neck, flexor surfaces of bilateral upper extremities) noted.  Vitals reviewed.   Assessment & Plan:   See Encounters Tab for problem based charting.  Patient discussed with Dr. Lynnae January

## 2018-12-11 NOTE — Assessment & Plan Note (Signed)
Eczema: She does have a extensive history of eczema and has been using triamcinolone cream for this.  On physical exams finding, she has rash at the right side of the neck as well as on her flexor surfaces of her upper extremities.  Plan: -Refill for triamcinolone cream given today

## 2018-12-11 NOTE — Assessment & Plan Note (Signed)
Hypertension-uncontrolled: Vicki Cook was previously prescribed amlodipine 5 mg daily however she states that she has not been taking the medication due to headaches.  Previously, she has tried hydrochlorothiazide but states that medication made her "dry." She currently denies chest pain, shortness of breath, headaches, dizziness or abdominal pain.  She last saw Dr. Donne Hazel in June of this year and was noted to have a BP of 141/99.  BP Readings from Last 3 Encounters:  12/11/18 (!) 177/98  10/12/18 (!) 141/99  11/18/17 130/82   Plan: - Discontinue amlodipine 5 mg daily -Start losartan 25 mg daily -Follow-up clinic 1 to 2 weeks for BP check

## 2018-12-12 ENCOUNTER — Telehealth: Payer: Self-pay | Admitting: *Deleted

## 2018-12-12 ENCOUNTER — Telehealth: Payer: Self-pay | Admitting: Internal Medicine

## 2018-12-12 LAB — CMP14 + ANION GAP
ALT: 8 IU/L (ref 0–32)
AST: 17 IU/L (ref 0–40)
Albumin/Globulin Ratio: 0.9 — ABNORMAL LOW (ref 1.2–2.2)
Albumin: 4 g/dL (ref 3.8–4.9)
Alkaline Phosphatase: 109 IU/L (ref 39–117)
Anion Gap: 17 mmol/L (ref 10.0–18.0)
BUN/Creatinine Ratio: 16 (ref 9–23)
BUN: 8 mg/dL (ref 6–24)
Bilirubin Total: 0.3 mg/dL (ref 0.0–1.2)
CO2: 22 mmol/L (ref 20–29)
Calcium: 9.6 mg/dL (ref 8.7–10.2)
Chloride: 101 mmol/L (ref 96–106)
Creatinine, Ser: 0.49 mg/dL — ABNORMAL LOW (ref 0.57–1.00)
GFR calc Af Amer: 126 mL/min/{1.73_m2} (ref >59)
GFR calc non Af Amer: 109 mL/min/{1.73_m2} (ref >59)
Globulin, Total: 4.3 g/dL (ref 1.5–4.5)
Glucose: 80 mg/dL (ref 65–99)
Potassium: 4.3 mmol/L (ref 3.5–5.2)
Sodium: 140 mmol/L (ref 134–144)
Total Protein: 8.3 g/dL (ref 6.0–8.5)

## 2018-12-12 LAB — CBC WITH DIFFERENTIAL/PLATELET
Basophils Absolute: 0.1 10*3/uL (ref 0.0–0.2)
Basos: 1 %
EOS (ABSOLUTE): 0.2 10*3/uL (ref 0.0–0.4)
Eos: 2 %
Hematocrit: 34.8 % (ref 34.0–46.6)
Hemoglobin: 11.5 g/dL (ref 11.1–15.9)
Immature Grans (Abs): 0 10*3/uL (ref 0.0–0.1)
Immature Granulocytes: 0 %
Lymphocytes Absolute: 2 10*3/uL (ref 0.7–3.1)
Lymphs: 23 %
MCH: 26.1 pg — ABNORMAL LOW (ref 26.6–33.0)
MCHC: 33 g/dL (ref 31.5–35.7)
MCV: 79 fL (ref 79–97)
Monocytes Absolute: 0.6 10*3/uL (ref 0.1–0.9)
Monocytes: 7 %
Neutrophils Absolute: 6 10*3/uL (ref 1.4–7.0)
Neutrophils: 67 %
Platelets: 406 10*3/uL (ref 150–450)
RBC: 4.41 x10E6/uL (ref 3.77–5.28)
RDW: 13.4 % (ref 11.7–15.4)
WBC: 9 10*3/uL (ref 3.4–10.8)

## 2018-12-12 NOTE — Progress Notes (Deleted)
Internal Medicine Clinic Attending  Case discussed with Dr. Agyei at the time of the visit.  We reviewed the resident's history and exam and pertinent patient test results.  I agree with the assessment, diagnosis, and plan of care documented in the resident's note.  Alexander Raines, M.D., Ph.D.  

## 2018-12-12 NOTE — Progress Notes (Signed)
Internal Medicine Clinic Attending  Case discussed with Dr. Agyei at the time of the visit.  We reviewed the resident's history and exam and pertinent patient test results.  I agree with the assessment, diagnosis, and plan of care documented in the resident's note.    

## 2018-12-12 NOTE — Assessment & Plan Note (Signed)
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  This was in the setting of her being worked up for diagnosis of rheumatoid arthritis could cause elevated globulins.  On further review, she had an unremarkable CMP in 2017.  She CBC showed normal hemoglobin though MCV was slightly decreased at 76 with thrombocytosis with platelets of 381.  Differential diagnosis for her prior laboratory work includes infection versus autoimmune disorder versus malignancy such as myeloma.  Plan: -Follow-up CMP, CBC, SPEP, IFE, UPEP

## 2018-12-12 NOTE — Telephone Encounter (Signed)
SPOKE WITH PATIENT REGARDING RHEUMATOLOGY REFERRAL. UNABLE TO PAY OUT OF POCKET FOR THIS SERVICE. IN THE PROCESS OF APPLYING FOR THE ORANGE CARD.

## 2018-12-12 NOTE — Telephone Encounter (Signed)
I was able to reach out to Vicki Cook to discuss her CBC and CMP.  Overall her labs are unremarkable and she expressed understanding and gratitude.

## 2018-12-13 ENCOUNTER — Other Ambulatory Visit: Payer: Self-pay

## 2018-12-13 LAB — MULTIPLE MYELOMA PANEL, SERUM
Albumin SerPl Elph-Mcnc: 3.5 g/dL (ref 2.9–4.4)
Albumin/Glob SerPl: 0.8 (ref 0.7–1.7)
Alpha 1: 0.3 g/dL (ref 0.0–0.4)
Alpha2 Glob SerPl Elph-Mcnc: 1 g/dL (ref 0.4–1.0)
B-Globulin SerPl Elph-Mcnc: 1.2 g/dL (ref 0.7–1.3)
Gamma Glob SerPl Elph-Mcnc: 2.4 g/dL — ABNORMAL HIGH (ref 0.4–1.8)
Globulin, Total: 4.8 g/dL — ABNORMAL HIGH (ref 2.2–3.9)
IgA/Immunoglobulin A, Serum: 672 mg/dL — ABNORMAL HIGH (ref 87–352)
IgG (Immunoglobin G), Serum: 2490 mg/dL — ABNORMAL HIGH (ref 586–1602)
IgM (Immunoglobulin M), Srm: 121 mg/dL (ref 26–217)

## 2018-12-14 LAB — UPEP/TP, 24-HR URINE
Albumin, U: 0 %
Alpha 1, Urine: 0 %
Alpha 2, Urine: 0 %
Beta, Urine: 0 %
Gamma Globulin, Urine: 0 %
Protein, 24H Urine: <228 mg/24 hr — ABNORMAL HIGH (ref 30–150)
Protein, Ur: <4 mg/dL

## 2018-12-15 ENCOUNTER — Telehealth: Payer: Self-pay | Admitting: Internal Medicine

## 2018-12-15 NOTE — Telephone Encounter (Signed)
I was able to reach out to Vicki Cook to discuss her laboratory results.  Overall, her CMP was unrevealing with exception of an elevated protein gap of 4.3.  Her UPEP showed elevated 24 protein of 228 however no M spike observed.  SPEP and quantitative immunoglobulin showed persistent hyper gammaglobulin anemia with IgG of 2490, IgA of 672 however no M spike was observed.  IFE still pending.  CBC does not show anemia, CMP does not show hypercalcemia and in the setting of uncontrolled rheumatoid arthritis this is the most likely culprit of her elevated hyper gammaglobulin anemia.  She is in the process of obtaining an orange card in order to be evaluated by rheumatology.

## 2019-01-12 NOTE — Addendum Note (Signed)
Addended by: Hulan Fray on: 01/12/2019 10:02 AM   Modules accepted: Orders

## 2019-01-15 MED FILL — LOSARTAN POTASSIUM 25 MG TA: 25 | 30 days supply | Qty: 30 | Fill #1

## 2019-02-22 MED FILL — LOSARTAN POTASSIUM 25 MG TA: 25 | 30 days supply | Qty: 30 | Fill #2

## 2019-04-10 MED FILL — LOSARTAN POTASSIUM 25 MG TA: 25 | 30 days supply | Qty: 30 | Fill #3

## 2019-05-21 ENCOUNTER — Other Ambulatory Visit: Payer: Self-pay | Admitting: Internal Medicine

## 2019-05-21 DIAGNOSIS — I10 Essential (primary) hypertension: Secondary | ICD-10-CM

## 2019-05-23 MED FILL — LOSARTAN POTASSIUM 25 MG TA: 25 | 30 days supply | Qty: 30 | Fill #0

## 2019-06-18 ENCOUNTER — Ambulatory Visit: Payer: Self-pay

## 2019-06-18 MED FILL — LOSARTAN POTASSIUM 25 MG TA: 25 | 30 days supply | Qty: 30 | Fill #1

## 2019-07-27 ENCOUNTER — Ambulatory Visit: Payer: Self-pay | Attending: Internal Medicine

## 2019-07-27 DIAGNOSIS — Z23 Encounter for immunization: Secondary | ICD-10-CM

## 2019-07-27 NOTE — Progress Notes (Signed)
   Covid-19 Vaccination Clinic  Name:  Vicki Cook    MRN: [id number] DOB: [birth year]  07/27/2019  Vicki Cook was observed post Covid-19 immunization for 15 minutes without incident. She was provided with Vaccine Information Sheet and instruction to access the V-Safe system.   Vicki Cook was instructed to call 911 with any severe reactions post vaccine: Marland Kitchen Difficulty breathing  . Swelling of face and throat  . A fast heartbeat  . A bad rash all over body  . Dizziness and weakness   Immunizations Administered    Name Date Dose VIS Date Route   Pfizer COVID-19 Vaccine 07/27/2019  9:52 AM 0.3 mL 04/13/2019 Intramuscular   Manufacturer: [employer], [employer]   Lot: [id number]   NDC: [address]-8

## 2019-08-07 MED FILL — LOSARTAN POTASSIUM 25 MG TA: 25 | 30 days supply | Qty: 30 | Fill #2

## 2019-08-20 ENCOUNTER — Ambulatory Visit: Payer: Self-pay | Attending: Internal Medicine

## 2019-08-20 DIAGNOSIS — Z23 Encounter for immunization: Secondary | ICD-10-CM

## 2019-08-20 NOTE — Progress Notes (Signed)
   Covid-19 Vaccination Clinic  Name:  CAYCI MCNABB    MRN: [id number] DOB: [birth year]  08/20/2019  Ms. Blue was observed post Covid-19 immunization for 15 minutes without incident. She was provided with Vaccine Information Sheet and instruction to access the V-Safe system.   Ms. Hollibaugh was instructed to call 911 with any severe reactions post vaccine: Marland Kitchen Difficulty breathing  . Swelling of face and throat  . A fast heartbeat  . A bad rash all over body  . Dizziness and weakness   Immunizations Administered    Name Date Dose VIS Date Route   Pfizer COVID-19 Vaccine 08/20/2019  2:52 PM 0.3 mL 06/27/2018 Intramuscular   Manufacturer: [employer], [employer]   Lot: [id number]   NDC: [address]-5

## 2019-10-16 ENCOUNTER — Other Ambulatory Visit: Payer: Self-pay | Admitting: *Deleted

## 2019-10-16 DIAGNOSIS — I10 Essential (primary) hypertension: Secondary | ICD-10-CM

## 2019-10-16 MED ORDER — LOSARTAN POTASSIUM 25 MG PO TABS: 25.0000 mg | ORAL_TABLET | Freq: Every day | ORAL | 0 refills | Status: DC

## 2019-10-16 MED FILL — LOSARTAN POTASSIUM 25 MG TA: 25 | 30 days supply | Qty: 30 | Fill #0

## 2019-11-22 ENCOUNTER — Other Ambulatory Visit: Payer: Self-pay | Admitting: Internal Medicine

## 2019-11-22 DIAGNOSIS — I10 Essential (primary) hypertension: Secondary | ICD-10-CM

## 2019-11-22 MED FILL — LOSARTAN POTASSIUM 25 MG TA: 25 | 30 days supply | Qty: 30 | Fill #0

## 2019-11-23 ENCOUNTER — Ambulatory Visit: Payer: Self-pay | Admitting: Internal Medicine

## 2019-11-23 ENCOUNTER — Encounter: Payer: Self-pay | Admitting: Internal Medicine

## 2019-11-23 VITALS — BP 159/90 | HR 73 | Temp 97.9°F | Ht <= 58 in | Wt 156.3 lb

## 2019-11-23 DIAGNOSIS — M05762 Rheumatoid arthritis with rheumatoid factor of left knee without organ or systems involvement: Secondary | ICD-10-CM

## 2019-11-23 DIAGNOSIS — I10 Essential (primary) hypertension: Secondary | ICD-10-CM

## 2019-11-23 DIAGNOSIS — M05761 Rheumatoid arthritis with rheumatoid factor of right knee without organ or systems involvement: Secondary | ICD-10-CM

## 2019-11-23 DIAGNOSIS — L859 Epidermal thickening, unspecified: Secondary | ICD-10-CM

## 2019-11-23 MED ORDER — LOSARTAN POTASSIUM 25 MG PO TABS: 25.0000 mg | ORAL_TABLET | Freq: Every day | ORAL | 0 refills | Status: DC

## 2019-11-23 NOTE — Telephone Encounter (Signed)
Called PT. N/A. Will f/u again.

## 2019-11-23 NOTE — Patient Instructions (Addendum)
Vicki Cook,   It was a pleasure seeing you in clinic. Today we discussed:   Hypertension: Please continue to take your medication as prescribed.  Rheumatoid arthritis: I will send referral to rheumatologist for this.  I am obtaining some labs today. I will call you with any abnormal results.    If you have any questions or concerns, please call our clinic at [phone] between 9am-5pm and after hours call [phone] and ask for the internal medicine resident on call. If you feel you are having a medical emergency please call 911.   Thank you, we look forward to helping you remain healthy!

## 2019-11-23 NOTE — Progress Notes (Signed)
   CC: hypertension f/u  HPI:  Vicki Cook is a 59 y.o. female with PMHx of hypertension and rheumatoid arthritis presenting for follow up of her hypertension. She also endorses bilateral foot pain secondary to calluses. Please see problem based charting for complete assessment and plan.  Past Medical History:  Diagnosis Date  . H/O pinworm infection   . Hypertension 2012  . Rheumatoid arthritis ([facility]) 2017   Review of Systems:  Negative except as stated in HPI.  Physical Exam:  Vitals:   11/23/19 1516  BP: (!) 159/90  Pulse: 73  Temp: 97.9 F (36.6 C)  TempSrc: Oral  SpO2: 100%  Weight: 156 lb 4.8 oz (70.9 kg)  Height: 4\' 10"  (1.473 m)   Physical Exam  Constitutional: Appears well-developed and well-nourished. No distress.  HENT: Normocephalic and atraumatic, EOMI, conjunctiva normal, moist mucous membranes Cardiovascular: Normal rate, regular rhythm, S1 and S2 present, no murmurs, rubs, gallops.  Distal pulses intact Respiratory: No respiratory distress, no accessory muscle use.  Effort is normal.  Lungs are clear to auscultation bilaterally. Musculoskeletal: Normal bulk and tone.  No peripheral edema noted. Neurological: Is alert and oriented x4, no apparent focal deficits noted. Skin: Warm and dry. Hyperkeratosis noted at the lateral aspect of the right foot and the fourth toe of left foot consistent with callus and corns.  Psychiatric: Normal mood and affect. Behavior is normal. Judgment and thought content normal.    Assessment & Plan:   See Encounters Tab for problem based charting.  Patient discussed with Dr. 

## 2019-11-24 LAB — CBC
Hematocrit: 35.4 % (ref 34.0–46.6)
Hemoglobin: 11.3 g/dL (ref 11.1–15.9)
MCH: 25.3 pg — ABNORMAL LOW (ref 26.6–33.0)
MCHC: 31.9 g/dL (ref 31.5–35.7)
MCV: 79 fL (ref 79–97)
Platelets: 375 10*3/uL (ref 150–450)
RBC: 4.46 x10E6/uL (ref 3.77–5.28)
RDW: 14.1 % (ref 11.7–15.4)
WBC: 8.1 10*3/uL (ref 3.4–10.8)

## 2019-11-24 LAB — BMP8+ANION GAP
Anion Gap: 14 mmol/L (ref 10.0–18.0)
BUN/Creatinine Ratio: 17 (ref 9–23)
BUN: 8 mg/dL (ref 6–24)
CO2: 22 mmol/L (ref 20–29)
Calcium: 9.4 mg/dL (ref 8.7–10.2)
Chloride: 102 mmol/L (ref 96–106)
Creatinine, Ser: 0.46 mg/dL — ABNORMAL LOW (ref 0.57–1.00)
GFR calc Af Amer: 128 mL/min/{1.73_m2} (ref >59)
GFR calc non Af Amer: 111 mL/min/{1.73_m2} (ref >59)
Glucose: 82 mg/dL (ref 65–99)
Potassium: 3.7 mmol/L (ref 3.5–5.2)
Sodium: 138 mmol/L (ref 134–144)

## 2019-11-24 LAB — LIPID PANEL
Chol/HDL Ratio: 2.9 ratio (ref 0.0–4.4)
Cholesterol, Total: 152 mg/dL (ref 100–199)
HDL: 53 mg/dL (ref >39)
LDL Chol Calc (NIH): 88 mg/dL (ref 0–99)
Triglycerides: 53 mg/dL (ref 0–149)
VLDL Cholesterol Cal: 11 mg/dL (ref 5–40)

## 2019-11-26 DIAGNOSIS — L859 Epidermal thickening, unspecified: Secondary | ICD-10-CM | POA: Insufficient documentation

## 2019-11-26 HISTORY — DX: Epidermal thickening, unspecified: L85.9

## 2019-11-26 NOTE — Progress Notes (Signed)
Internal Medicine Clinic Attending  I saw and evaluated the patient.  I personally confirmed the key portions of the history and exam documented by Dr. Mcarthur Rossetti and I reviewed pertinent patient test results.  Calluses were sharply pared, no bleeding, tolerated well.  SMall corn was partially removed from plantar 5th MTSP, though diameter was so small that entirety could not be removed. The assessment, diagnosis, and plan were formulated together and I agree with the documentation in the resident's note.

## 2019-11-26 NOTE — Assessment & Plan Note (Signed)
Ms Cannaday is on losartan 25mg  daily for her hypertension. She was previously unable to tolerate amlodipine or hydrochlorothiazide. She endorses medication compliance but endorses that she has ran out over the past 2-3 days. BP today 159/90. She denies any chest pain, shortness of breath, headache, dizziness/lightheadedness or focal weakness. She endorses low sodium diet.   Plan: Continue losartan 25mg  daily  F/u in 3 months

## 2019-11-26 NOTE — Assessment & Plan Note (Signed)
Patient was diagnosed in rheumatoid arthritis in 2017 with positive objective and serologic findings. Patient was subsequently started on methotrexate but was unable to tolerate due to side effects. Since then, she has not followed up with rheumatologist and has been trying natural remedies with an anti-inflammatory diet for her rheumatoid arthritis. She notes that her symptoms are currently well controlled. Discussed recommendation to follow up with rheumatologist for disease monitoring. She is amenable to following up with rheumatologist at this time.  Plan: Referral to rheumatology

## 2019-11-26 NOTE — Assessment & Plan Note (Signed)
Ms Imel was noted to have hyperkeratosis of right sole and the fourth left toe consistent with calluses and corns. Callus removal was performed in office with improvement.   Plan: Continue to monitor

## 2019-12-03 ENCOUNTER — Telehealth: Payer: Self-pay | Admitting: Internal Medicine

## 2019-12-03 NOTE — Telephone Encounter (Signed)
Attempted to call pt with results. Left VM. If returns call, please inform that results wnl and continue with losartan daily.

## 2019-12-19 ENCOUNTER — Ambulatory Visit: Payer: Self-pay

## 2019-12-28 ENCOUNTER — Other Ambulatory Visit: Payer: Self-pay | Admitting: Internal Medicine

## 2019-12-28 DIAGNOSIS — I10 Essential (primary) hypertension: Secondary | ICD-10-CM

## 2019-12-28 MED FILL — LOSARTAN POTASSIUM 25 MG TA: 25 | 30 days supply | Qty: 30 | Fill #0

## 2020-01-01 ENCOUNTER — Telehealth: Payer: Self-pay | Admitting: Internal Medicine

## 2020-01-01 ENCOUNTER — Other Ambulatory Visit: Payer: Self-pay | Admitting: Internal Medicine

## 2020-01-01 DIAGNOSIS — I10 Essential (primary) hypertension: Secondary | ICD-10-CM

## 2020-01-01 MED ORDER — LOSARTAN POTASSIUM 25 MG PO TABS: 25.0000 mg | ORAL_TABLET | Freq: Every day | ORAL | 0 refills | Status: DC

## 2020-01-01 MED FILL — LOSARTAN POTASSIUM 25 MG TA: 25 | 30 days supply | Qty: 30 | Fill #0

## 2020-01-01 NOTE — Telephone Encounter (Signed)
   Reason for call:   I received a call from Ms. Gretchen Portela at [facility] indicating she needs refills for her BP meds (asking 90 days supply).   Pertinent Data:   She has ran out of her med. Reviewed chart and last note from PCP. Appropriate to refill. Informed patient to call clinic during working hours for next refills and to see PCP in 2 months.    Assessment / Plan / Recommendations:   Sent refill for Losartan 25 mg QD # 90.  F/u in clinic in 2 months.  As always, pt is advised that if symptoms worsen or new symptoms arise, they should go to an urgent care facility or to to ER for further evaluation.   Chevis Pretty, MD   01/01/2020, 6:35 PM

## 2020-01-01 NOTE — Progress Notes (Signed)
Received page on oncall pager about refill on Lisinopril. Patient ran out of her med. Appropriate for refill. Sending refill for Lisinopril for 3 months.  Chevis Pretty, MD [id number] 01/01/2020, 6:42 PM Pager: [phone]

## 2020-01-11 ENCOUNTER — Other Ambulatory Visit: Payer: Self-pay | Admitting: Student

## 2020-01-11 ENCOUNTER — Telehealth: Payer: Self-pay | Admitting: Internal Medicine

## 2020-01-11 DIAGNOSIS — L859 Epidermal thickening, unspecified: Secondary | ICD-10-CM

## 2020-01-11 NOTE — Telephone Encounter (Signed)
I called and spoke to the patient.  Patient states that she needs a podiatry referral for calluses and corns on her bilateral feet.  She did get her calluses removed on her last visit with [place] 11/23/2019 but she said pain still persist.  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  Sent in podiatry referral.  Patient has an orange card.  All questions were answered.

## 2020-01-11 NOTE — Telephone Encounter (Signed)
Pt is wanting a podiatry referral; pt has [facility] card [phone]

## 2020-01-16 ENCOUNTER — Telehealth: Payer: Self-pay | Admitting: *Deleted

## 2020-01-16 NOTE — Telephone Encounter (Signed)
CALLED AND SPOKE WITH PATIENT REGARDING THE FOOT REFERRAL. EXPLAINED TO PATIENT THAT THE FOOT CLINIC IS NOT UNTIL November AND NO DATE HAS BEEN SET YET.  [facility] WILL CONTACT HER WITH AN APPOINTMENT /THE OFFICE HAS THE PODIATRY REFERRAL FOR THE ORANGE CARD.  IF SHE IS UNABLE TO WAIT UNTIL THEN SHE CAN CALL THE [facility] TO BE SEEN AND WILL BE PAYING OUT OF POCKET. EXPLAINED TO HER THAT THE ORANGE WILL NOT PAY FOR IT. SHE WAS TALKING ABOUT GOING SOMEWHERE IN [place], EXPLAINED TO HER, AGAIN, IF SHE GOES ANYWHERE BESIDES WHERE SHE IS SCHEDULED TO GO, SHE WILL PAY OUT OF POCKET.

## 2020-01-16 NOTE — Telephone Encounter (Signed)
Patient calling states MD told her if she had not heard from our office about a foot referral to give [place] a call back. Will one of you call this patient about that referral.

## 2020-01-22 NOTE — Addendum Note (Signed)
Addended by: Neomia Dear on: 01/22/2020 04:05 PM   Modules accepted: Orders

## 2020-01-31 ENCOUNTER — Other Ambulatory Visit: Payer: Self-pay | Admitting: Internal Medicine

## 2020-01-31 DIAGNOSIS — I10 Essential (primary) hypertension: Secondary | ICD-10-CM

## 2020-01-31 MED FILL — LOSARTAN POTASSIUM 25 MG TA: 25 | 30 days supply | Qty: 30 | Fill #0

## 2020-02-04 ENCOUNTER — Other Ambulatory Visit: Payer: Self-pay

## 2020-02-04 DIAGNOSIS — I10 Essential (primary) hypertension: Secondary | ICD-10-CM

## 2020-02-04 MED ORDER — LOSARTAN POTASSIUM 25 MG PO TABS: 25.0000 mg | ORAL_TABLET | Freq: Every day | ORAL | 3 refills | Status: DC

## 2020-02-04 MED FILL — LOSARTAN POTASSIUM 25 MG TA: 25 | 90 days supply | Qty: 90 | Fill #0

## 2020-02-04 NOTE — Telephone Encounter (Signed)
Patient requesting 90 day RX.  Last RX sent on 01/31/20 w/ note that patient needs to be seen in [facility] ASAP, but this has remained on RX since 10/2019.  LOV 11/23/19 NOV 02/22/20  Will forward to yellow team for 90 day RX consideration. SChaplin, RN,BSN

## 2020-02-04 NOTE — Telephone Encounter (Signed)
losartan (COZAAR) 25 MG tablet, refill request @  [facility] - [place], [place] - [address] [address] [facility] Phone:  [phone]  Fax:  [phone]     Pt would like 90 days supply.

## 2020-02-12 ENCOUNTER — Telehealth: Payer: Self-pay | Admitting: Internal Medicine

## 2020-02-12 ENCOUNTER — Other Ambulatory Visit: Payer: Self-pay

## 2020-02-12 ENCOUNTER — Ambulatory Visit (INDEPENDENT_AMBULATORY_CARE_PROVIDER_SITE_OTHER): Payer: Self-pay | Admitting: Internal Medicine

## 2020-02-12 DIAGNOSIS — R059 Cough, unspecified: Secondary | ICD-10-CM

## 2020-02-12 MED ORDER — LORATADINE 10 MG PO TABS
10.0000 mg | ORAL_TABLET | Freq: Every day | ORAL | 2 refills | Status: DC | PRN
Start: 2020-02-12 — End: 2020-12-02

## 2020-02-12 MED ORDER — PANTOPRAZOLE SODIUM 20 MG PO TBEC
20.0000 mg | DELAYED_RELEASE_TABLET | Freq: Every day | ORAL | 1 refills | Status: DC
Start: 2020-02-12 — End: 2020-02-27

## 2020-02-12 NOTE — Telephone Encounter (Signed)
   Reason for call:   I received a call from [name] at 8:20 PM indicating persistent cough and reflux symptoms.   Pertinent Data:   This is a 59 year old female with a history of HTN and rheumatoid arthritis who called in earlier today for symptoms of a dry cough that typically improves with alka-selzer. Current cough did not improve with alka-seltzer or antihistamine. Scheduled for a COVID test on Thursday. Started on a trial of loratadine 10 mg daily for chronic cough.   She reports to me that she has had this cough for 1.5 months, she thinks that there was phlegm in the past but not anymore. She will sometimes get antibiotics that help with her symptoms. She took the allergy medications this morning. She reports that when she eats anything she will start coughing. She reports having some reflux symptoms, no actual vomiting, reports that it's been going on the past 3-4 days. She also feels like she has had a decreased appetite. She always eats spicy foods but when she is not eating as much she will eat spicier foods. Denies any sore throat, shortness of breath, fevers, chills, denies any sick contacts, stomach pain, dizziness, or lightheadedness. She reports urinating alright. She is taking a COVID test on Thursday.    Assessment / Plan / Recommendations:   This is a 59 year old female with a history of HTN and rheumatoid arthritis calling in to discuss her chronic cough. She had a tele-health visit this morning and there was concern for an allergic component and she was started on loratadine which she started taking. She reports having symptoms of reflux and a decreased appetite. She reports eating spicier foods recently. There was a concern about untreated GERD based on the tele-health visit done earlier today. This does not appear to be infectious based on her symptoms and thus will hold off on any antibiotics at this time. Advised that we will start a trial of a PPI however also have  her continue the loratadine. Discussed that this may make it difficult to determine the cause of her symptoms if her chronic cough improves. Advised her to stop eating spicy foods since this could worsen acid reflux symptoms, also advised to avoid chocolate and wines. Advised to make a follow up appointment in 2-4 weeks to assess how her cough is doing.   As always, pt is advised that if symptoms worsen or new symptoms arise, they should go to an urgent care facility or to to ER for further evaluation.   Vicki Severance, MD   02/12/2020, 8:27 PM

## 2020-02-12 NOTE — Progress Notes (Signed)
Internal Medicine Clinic Attending  Case discussed with Dr. Aslam at the time of the visit.  We reviewed the resident's history and exam and pertinent patient test results.  I agree with the assessment, diagnosis, and plan of care documented in the resident's note.  Sherral Dirocco, M.D., Ph.D.  

## 2020-02-12 NOTE — Assessment & Plan Note (Signed)
Patient endorses one month of dry cough. She notes that she initially had some phlegm production which has since resolved. She notes that she has a history of this and usually takes alka seltzer which helps her symptoms; however, current symptoms are unrelieved by alka-seltzer. She also notes trying an antihistamine without any significant improvement. She denies any fevers or chills or recent sick contacts. She has received both does of her COVID vaccine. She is scheduled for COVID testing on Thursday. Of note, patient does note improvement in the past with claritin. Will try this for symptomatic relief.  On chart review, appears that patient has a history of chronic cough; last evaluated for this in 2016. At that time, she was treated for possible bronchitis with zpack.  At the time, there were concerns for possible untreated GERD. At this time, will proceed with loratadine 10mg  daily. If her cough persists, will have trial of PPI.   Plan: Loratadine 10mg  daily If cough is persistent, trial of PPI and consider CXR

## 2020-02-12 NOTE — Progress Notes (Signed)
   CC: cough  This is a telephone encounter between Vicki Cook and Vicki Cook on 02/12/2020 for one month of cough. The visit was conducted with the patient located at home and [place] Vicki Cook at [facility]. The patient's identity was confirmed using their DOB and current address. The patient has consented to being evaluated through a telephone encounter and understands the associated risks (an examination cannot be done and the patient may need to come in for an appointment) / benefits (allows the patient to remain at home, decreasing exposure to coronavirus). I personally spent 10 minutes on medical discussion.   HPI:  [name] is a 59 y.o. with PMH as below.   Please see A&P for assessment of the patient's acute and chronic medical conditions.   Past Medical History:  Diagnosis Date  . H/O pinworm infection   . Hypertension 2012  . Rheumatoid arthritis ([facility]) 2017   Review of Systems:  Negative except as stated in HPI.    Assessment & Plan:   See Encounters Tab for problem based charting.  Patient discussed with Dr. Sandre Kitty

## 2020-02-14 ENCOUNTER — Telehealth: Payer: Self-pay | Admitting: *Deleted

## 2020-02-14 ENCOUNTER — Ambulatory Visit: Payer: Self-pay | Admitting: Internal Medicine

## 2020-02-14 DIAGNOSIS — L859 Epidermal thickening, unspecified: Secondary | ICD-10-CM

## 2020-02-14 DIAGNOSIS — R059 Cough, unspecified: Secondary | ICD-10-CM

## 2020-02-14 DIAGNOSIS — I10 Essential (primary) hypertension: Secondary | ICD-10-CM

## 2020-02-14 MED ORDER — LOSARTAN POTASSIUM 50 MG PO TABS: 50.0000 mg | ORAL_TABLET | Freq: Every day | ORAL | 0 refills | Status: DC

## 2020-02-14 MED FILL — LOSARTAN POTASSIUM 50 MG TA: 50 | 30 days supply | Qty: 30 | Fill #0

## 2020-02-14 NOTE — Progress Notes (Signed)
   CC: cough  HPI:  Vicki Cook is a 59 y.o. female with PMHx as stated below presenting for evaluation of cough. Patient had a telehealth encounter for this on 10/12 for a persistent dry cough unrelieved with over the counter antihistamine or alka seltzer. She was prescribed loratadine 10mg  daily at the time also prescribed protonix during a telehealth visit for GERD symptoms. She notes some improvement in cough but notes still persisted. Denies any dyspnea, fevers or chills or mucus production at this time.   Past Medical History:  Diagnosis Date  . H/O pinworm infection   . Hypertension 2012  . Rheumatoid arthritis ([facility]) 2017   Review of Systems:  Negative except as stated in HPI.  Physical Exam:  Vitals:   02/14/20 1350 02/14/20 1416  BP: (!) 170/102 (!) 176/98  Pulse: 89 86  Temp: 98.4 F (36.9 C)   TempSrc: Oral   SpO2: 100%   Weight: 157 lb 11.2 oz (71.5 kg)    Physical Exam  Constitutional: Appears well-developed and well-nourished. No distress.  HENT: Normocephalic and atraumatic, EOMI, conjunctiva normal, moist mucous membranes Cardiovascular: Normal rate, regular rhythm, S1 and S2 present, no murmurs, rubs, gallops.  Distal pulses intact Respiratory: No respiratory distress, no accessory muscle use.  Effort is normal.  Lungs are clear to auscultation bilaterally. Musculoskeletal: Normal bulk and tone.  No peripheral edema noted. Neurological: Is alert and oriented x4, no apparent focal deficits noted. Skin: Warm and dry.  No rash, erythema, lesions noted. Psychiatric: Slightly anxious mood; Behavior is normal. Judgment and thought content normal.    Assessment & Plan:   See Encounters Tab for problem based charting.  Patient discussed with Dr. 02/16/20

## 2020-02-14 NOTE — Patient Instructions (Addendum)
Vicki Cook,  It was a pleasure seeing you in clinic. Today we discussed:   Cough: Continue loratadine and prantoprazole daily. You may also take over the counter cough suppressant to help with the symptoms. I would advise to get COVID tested at this time.  If you develop any fevers/chills, please let us know.  Hypertension: Your BP is very high at this visit. At this time, I am increasing your dose of losartan. Please follow up in 1-2 weeks for BP check   If you have any questions or concerns, please call our clinic at [phone] between 9am-5pm and after hours call [phone] and ask for the internal medicine resident on call. If you feel you are having a medical emergency please call 911.   Thank you, we look forward to helping you remain healthy!

## 2020-02-14 NOTE — Assessment & Plan Note (Addendum)
Patient has history of poorly controlled hypertension; previously unable to tolerate amlodipine or HCTZ. Patient has been on losartan 25mg  daily. BP is 170/102 today, repeat 176/98. She endorses medication compliance and denies any chest pain, dyspnea, headaches, vision changes, dizziness/lightheadedness or focal weakness. She does endorse using increased amount of spices recently in setting of dysguesia associated with her cough.  Plan: Increase losartan to 50mg  daily F/u in 1-2 weeks for BP check and BMP Advised on low sodium diet

## 2020-02-14 NOTE — Telephone Encounter (Signed)
Patient called in stating she had tele appt 2 days ago for cough and is not feeling any better. Has not been able to work for 3 days. Denies fevers, chills. Has been fully vaccinated for Covid. In person appt given for this afternoon with PCP. Kinnie Feil, BSN, RN-BC

## 2020-02-14 NOTE — Progress Notes (Signed)
Internal Medicine Clinic Attending  Case discussed with Dr. Aslam at the time of the visit.  We reviewed the resident's history and exam and pertinent patient test results.  I agree with the assessment, diagnosis, and plan of care documented in the resident's note.  Analeigha Nauman, M.D., Ph.D.  

## 2020-02-14 NOTE — Telephone Encounter (Signed)
Thank you :)

## 2020-02-14 NOTE — Assessment & Plan Note (Signed)
Patient inquiring about follow up with podiatry. Advised her that there are no availabilities through the [facility] until November 2021. However, if her hyperkeratosis is bothering her, she can schedule a visit with [facility] but would have to pay out of pocket. On examination, I could not appreciate any calluses that we could debride in the office.

## 2020-02-14 NOTE — Assessment & Plan Note (Signed)
Patient is following up for her unproductive cough for approximately one month duration. She was evaluated for this via telehealth on 10/12 and prescribed loratadine for possible allergies. That evening, patient called in and spoke with the resident on call; endorsing symptoms of GERD as she had been using increased spices to overcome her dysguesia. At this time, she was prescribed protonix. She notes that she has had mild relief with combination of loratadine and protonix; however, cough is still persistent. She denies any fevers or chills. She endorse getting both COVID vaccines but  is schedule for COVID testing this afternoon.  Of note, patient notes that she has similar episodes almost yearly; I suspect possibly seasonal allergies playing a role in her cough.   Plan:  Continue Loratadine 10mg  daily and Protonix 20mg  daily Advised for over the counter anti-tussive medication If symptoms are persistent, will consider CXR

## 2020-02-16 ENCOUNTER — Encounter: Payer: Self-pay | Admitting: Student

## 2020-02-16 ENCOUNTER — Other Ambulatory Visit: Payer: Self-pay | Admitting: Student

## 2020-02-16 ENCOUNTER — Telehealth: Payer: Self-pay | Admitting: Student

## 2020-02-16 DIAGNOSIS — R059 Cough, unspecified: Secondary | ICD-10-CM

## 2020-02-16 NOTE — Progress Notes (Signed)
  [facility] Telephone Encounter Continuity Care Appointment  HPI:   This telephone encounter was created for [name] on 02/16/2020 for the following purpose/cc . Patient states she was prescribed loratadine and protonix 2 days ago by Dr. Mcarthur Rossetti in the clinic. Patient states the chronic cough has occurred for the past month. She states it is productive in nature and the only relief she has had was when she took mucinex in the evening. 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 She states she does have some chest pain when she has coughing spells, denies shortness of breath. She notes in the past she has had an antibiotic that helped with her cough, I explained to the patient that without a clear source of infection, I am unable to write for an antibiotic and that if the patient needed emergent medical attention she should seek the closest emergency department/urgent care. Ms. Ferryman then explained that this would be a high cost and she cannot afford to do that. I reiterated that if she clinically worsens to seek emergent medical help. I informed the patient that because she has not clinically improved, despite it only being 2 days since starting the medications, I would order the chest x-ray. Patient agrees to plan and precautions to go to emergency department/urgent care.    Past Medical History:  Past Medical History:  Diagnosis Date  . H/O pinworm infection   . Hypertension 2012  . Rheumatoid arthritis (HCC) 2017      ROS:   Per HPI   Assessment / Plan / Recommendations:   Please see A&P under problem oriented charting for assessment of the patient's acute and chronic medical conditions.   As always, pt is advised that if symptoms worsen or new symptoms arise, they should go to an urgent care facility or to to ER for further evaluation.   Consent and Medical Decision  Making:    This is a telephone encounter between Vicki Cook and Thalia Bloodgood on 02/16/2020 for chronic cough for the past month. The visit was conducted with the patient located at home and Thalia Bloodgood at [facility]. The patient's identity was confirmed using their DOB and current address. The patient has consented to being evaluated through a telephone encounter and understands the associated risks (an examination cannot be done and the patient may need to come in for an appointment) / benefits (allows the patient to remain at home, decreasing exposure to coronavirus). I personally spent 20 minutes on medical discussion.

## 2020-02-16 NOTE — Assessment & Plan Note (Signed)
Patient continues have chronic cough despite loratadine 10 mg daily and Protonix 20 mg daily, started 10/14. Patient requested antibiotics, however, discussed with patient without full physical exam and suspicion for bacterial etiology, I cannot write for antibiotics. Discussed with patient that next step would be chest x-ray. Recommended patient be seen by urgent care or emergency department if worsens clinically.  Plan: -Continue loratadine 10mg  daily and Protonix 20 mg daily and over the counter antitussive -Ordered Chest x-ray

## 2020-02-17 ENCOUNTER — Telehealth: Payer: Self-pay | Admitting: Internal Medicine

## 2020-02-17 NOTE — Telephone Encounter (Signed)
Paged with Vicki Cook's number.  [name] chief complaint was cough.  This has been discussed with multiple residents in the past week.  She requests antibiotics and has requested this before.  The cough becomes worse after eating spicy foods and when she lays down.  I believe this is consistent with what has been documented in the chart as GERD.  Discussed giving the medication time and patient is persistently she may need antibiotics.  On her last telephone visit a chest x-ray was ordered, but not completed.  Patient is persistent that she was treated with antibiotics for a stomach problem and I asked her to give me time to review the chart and she was agreeable.  I called the patient back and she states she will be going to urgent care as she thinks she needs to be seen in person today.

## 2020-02-20 ENCOUNTER — Other Ambulatory Visit: Payer: Self-pay

## 2020-02-20 ENCOUNTER — Emergency Department (HOSPITAL_BASED_OUTPATIENT_CLINIC_OR_DEPARTMENT_OTHER)
Admission: EM | Admit: 2020-02-20 | Discharge: 2020-02-20 | Disposition: A | Discharge status: Home / Self Care | Payer: Self-pay | Attending: Emergency Medicine | Admitting: Emergency Medicine

## 2020-02-20 ENCOUNTER — Emergency Department (HOSPITAL_BASED_OUTPATIENT_CLINIC_OR_DEPARTMENT_OTHER): Payer: Self-pay

## 2020-02-20 ENCOUNTER — Encounter (HOSPITAL_BASED_OUTPATIENT_CLINIC_OR_DEPARTMENT_OTHER): Payer: Self-pay | Admitting: *Deleted

## 2020-02-20 DIAGNOSIS — R0602 Shortness of breath: Secondary | ICD-10-CM | POA: Insufficient documentation

## 2020-02-20 DIAGNOSIS — Z79899 Other long term (current) drug therapy: Secondary | ICD-10-CM | POA: Insufficient documentation

## 2020-02-20 DIAGNOSIS — I10 Essential (primary) hypertension: Secondary | ICD-10-CM | POA: Insufficient documentation

## 2020-02-20 DIAGNOSIS — R059 Cough, unspecified: Secondary | ICD-10-CM | POA: Insufficient documentation

## 2020-02-20 DIAGNOSIS — Z20822 Contact with and (suspected) exposure to covid-19: Secondary | ICD-10-CM | POA: Insufficient documentation

## 2020-02-20 LAB — BASIC METABOLIC PANEL
Anion gap: 12 (ref 5–15)
BUN: 16 mg/dL (ref 6–20)
CO2: 25 mmol/L (ref 22–32)
Calcium: 8.9 mg/dL (ref 8.9–10.3)
Chloride: 101 mmol/L (ref 98–111)
Creatinine, Ser: 0.59 mg/dL (ref 0.44–1.00)
GFR, Estimated: >60 mL/min (ref >60)
Glucose, Bld: 153 mg/dL — ABNORMAL HIGH (ref 70–99)
Potassium: 4 mmol/L (ref 3.5–5.1)
Sodium: 138 mmol/L (ref 135–145)

## 2020-02-20 LAB — CBC
HCT: 39.3 % (ref 36.0–46.0)
Hemoglobin: 12.5 g/dL (ref 12.0–15.0)
MCH: 25.2 pg — ABNORMAL LOW (ref 26.0–34.0)
MCHC: 31.8 g/dL (ref 30.0–36.0)
MCV: 79.2 fL — ABNORMAL LOW (ref 80.0–100.0)
Platelets: 377 10*3/uL (ref 150–400)
RBC: 4.96 MIL/uL (ref 3.87–5.11)
RDW: 15.5 % (ref 11.5–15.5)
WBC: 16.7 10*3/uL — ABNORMAL HIGH (ref 4.0–10.5)
nRBC: 0 % (ref 0.0–0.2)

## 2020-02-20 LAB — RESPIRATORY PANEL BY RT PCR (FLU A&B, COVID)
Influenza A by PCR: NEGATIVE
Influenza B by PCR: NEGATIVE
SARS Coronavirus 2 by RT PCR: NEGATIVE

## 2020-02-20 MED ORDER — IOHEXOL 350 MG/ML SOLN
100.0000 mL | Freq: Once | INTRAVENOUS | Status: AC | PRN
  Administered 2020-02-20: 100 mL via INTRAVENOUS

## 2020-02-20 NOTE — ED Provider Notes (Signed)
[facility] EMERGENCY DEPARTMENT Provider Note   CSN: [id number] Arrival date & time: 02/20/20  1402     History Chief Complaint  Patient presents with  . Shortness of Breath    Vicki Cook is a 59 y.o. female.   Shortness of Breath Severity:  Moderate Onset quality:  Gradual Timing:  Constant Progression:  Worsening Chronicity:  New Relieved by:  Nothing Worsened by:  Nothing Ineffective treatments: abx, steroids and albuterol. Associated symptoms: cough and sputum production   Associated symptoms: no chest pain, no fever, no headaches, no rash and no vomiting        Past Medical History:  Diagnosis Date  . H/O pinworm infection   . Hypertension 2012  . Rheumatoid arthritis (HCC) 2017    Patient Active Problem List   Diagnosis Date Noted  . 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 Eczema 12/11/2018  . Ear congestion, right 10/12/2018  . Rheumatoid arthritis (HCC) 05/04/2015  . Anal pruritus 02/19/2015  . Cough 07/22/2014  . Panic attack 07/19/2014  . Microcytic anemia 05/02/2014  . Leucocytosis 05/02/2014  . Pruritic rash 04/24/2014  . Bilateral knee pain 03/20/2014  . Healthcare maintenance 02/25/2014  . HTN (hypertension) 02/07/2014  . Baker's cyst of knee 07/20/2013    History reviewed. No pertinent surgical history.   OB History   No obstetric history on file.     Family History  Problem Relation Age of Onset  . Heart disease Mother   . Hyperlipidemia Mother   . Hypertension Mother   . Heart disease Father   . Hypertension Father   . Diabetes Father     Social History   Tobacco Use  . Smoking status: Never Smoker  . Smokeless tobacco: Never Used  Vaping Use  . Vaping Use: Never used  Substance Use Topics  . Alcohol use: No    Alcohol/week: 0.0 standard drinks  . Drug use: No    Home Medications Prior to Admission medications   Medication Sig Start Date End Date Taking?  Authorizing Provider  loratadine (CLARITIN) 10 MG tablet Take 1 tablet (10 mg total) by mouth daily as needed for allergies or rhinitis. 02/12/20 02/11/21  Eliezer Bottom, MD  losartan (COZAAR) 50 MG tablet Take 1 tablet (50 mg total) by mouth daily. PATIENT MUST BE SEEN BY [facility] ASAP FOR ADDITIONAL REFILLS 02/14/20 05/14/20  Eliezer Bottom, MD  pantoprazole (PROTONIX) 20 MG tablet Take 1 tablet (20 mg total) by mouth daily. 02/12/20   Claudean Severance, MD  triamcinolone ointment (KENALOG) 0.1 % Apply to affected areas twice daily as needed 12/11/18   Yvette Rack, MD    Allergies    Asa [aspirin] and Lisinopril  Review of Systems   Review of Systems  Constitutional: Negative for chills and fever.  HENT: Negative for congestion and rhinorrhea.   Respiratory: Positive for cough, sputum production and shortness of breath.   Cardiovascular: Negative for chest pain and palpitations.  Gastrointestinal: Negative for diarrhea, nausea and vomiting.  Genitourinary: Negative for difficulty urinating and dysuria.  Musculoskeletal: Negative for arthralgias and back pain.  Skin: Negative for rash and wound.  Neurological: Negative for light-headedness and headaches.    Physical Exam Updated Vital Signs BP (!) 193/99   Pulse 80   Temp 98.5 F (36.9 C) (Oral)   Resp 18   Ht 5' (1.524 m)   Wt 70.3 kg   SpO2 100%   BMI 30.27 kg/m  Physical Exam Vitals and nursing note reviewed. Exam conducted with a chaperone present.  Constitutional:      General: She is not in acute distress.    Appearance: Normal appearance.  HENT:     Head: Normocephalic and atraumatic.     Nose: No rhinorrhea.  Eyes:     General:        Right eye: No discharge.        Left eye: No discharge.     Conjunctiva/sclera: Conjunctivae normal.  Cardiovascular:     Rate and Rhythm: Normal rate and regular rhythm.  Pulmonary:     Effort: Pulmonary effort is normal. No respiratory distress.     Breath  sounds: No stridor. Rhonchi present.  Chest:     Chest wall: No tenderness.  Abdominal:     General: Abdomen is flat. There is no distension.     Palpations: Abdomen is soft.  Musculoskeletal:        General: No tenderness or signs of injury.  Skin:    General: Skin is warm and dry.  Neurological:     General: No focal deficit present.     Mental Status: She is alert. Mental status is at baseline.     Motor: No weakness.  Psychiatric:        Mood and Affect: Mood normal.        Behavior: Behavior normal.     ED Results / Procedures / Treatments   Labs (all labs ordered are listed, but only abnormal results are displayed) Labs Reviewed  RESPIRATORY PANEL BY RT PCR (FLU A&B, COVID)  CBC  BASIC METABOLIC PANEL    EKG None  Radiology No results found.  Procedures Procedures (including critical care time)  Medications Ordered in ED Medications - No data to display  ED Course  I have reviewed the triage vital signs and the nursing notes.  Pertinent labs & imaging results that were available during my care of the patient were reviewed by me and considered in my medical decision making (see chart for details).    MDM Rules/Calculators/A&P                          Worsening cough congestion sputum production, diagnosed with multifocal pneumonia by chest x-ray a few days ago started on levofloxacin steroids albuterol, no symptomatic relief, worsening symptoms.  Tested for Covid and is at [employer] a few days ago was negative, we will recheck here.  With concerns of the cough has been going on for a long time and the multifocal disease we will get a CT scan to further evaluate details of her pathology.  If there are significant complication she may need a change in antibiotics, this may be viral in origin, there could be infarct from PE causing cough and dysfunction.  She will is also on losartan which could cause cough.  Screening labs are sent as well she denies chest pain  she denies significant shortness of breath other than during coughing fits.  Pt care was handed off to on coming provider at 1500.  Complete history and physical and current plan have been communicated.  Please refer to their note for the remainder of ED care and ultimate disposition.  Pt seen in conjunction with Dr. Pilar Plate  Final Clinical Impression(s) / ED Diagnoses Final diagnoses:  Cough    Rx / DC Orders ED Discharge Orders    None       Myrtis Ser,  Stevphen Meuse, MD 02/20/20 [phone]

## 2020-02-20 NOTE — ED Provider Notes (Signed)
  Provider Note MRN:  [id number]  Arrival date & time: 02/20/20    ED Course and Medical Decision Making  Assumed care from Dr. Myrtis Ser at shift change.  Work-up is reassuring.  Work-up is overall normal, no signs of pulmonary involvement, no PE, no pneumonia.  Patient does seem to have some cough related to spicy food and laying flat, advised to continue her Protonix and follow-up with PCP, also advised to continue and complete her antibiotics.  Procedures  Final Clinical Impressions(s) / ED Diagnoses     ICD-10-CM   1. Cough  R05.9     ED Discharge Orders    None        Discharge Instructions     You were evaluated in the Emergency Department and after careful evaluation, we did not find any emergent condition requiring admission or further testing in the hospital.  Your exam/testing today was overall reassuring.  We recommend follow-up with your primary care doctor to further discuss your symptoms.  Please return to the Emergency Department if you experience any worsening of your condition.  Thank you for allowing [place] to be a part of your care.     Elmer Sow. Pilar Plate, MD [facility] Emergency Medicine [facility] [email or web address]    Sabas Sous, MD 02/20/20 [phone]

## 2020-02-20 NOTE — ED Triage Notes (Signed)
C/o SOB x 1 week , Dx Pneumonia

## 2020-02-20 NOTE — Discharge Instructions (Addendum)
You were evaluated in the Emergency Department and after careful evaluation, we did not find any emergent condition requiring admission or further testing in the hospital.  Your exam/testing today was overall reassuring.  We recommend follow-up with your primary care doctor to further discuss your symptoms.  Please return to the Emergency Department if you experience any worsening of your condition.  Thank you for allowing [place] to be a part of your care.

## 2020-02-22 ENCOUNTER — Encounter: Payer: Self-pay | Admitting: Internal Medicine

## 2020-02-25 ENCOUNTER — Encounter: Payer: Self-pay | Admitting: Internal Medicine

## 2020-02-26 ENCOUNTER — Telehealth: Payer: Self-pay | Admitting: Internal Medicine

## 2020-02-26 NOTE — Telephone Encounter (Signed)
She has been evaluated for this previously and treated for allergies vs GERD. CT Chest done on 10/20 was negative for PE or pneumonia. I would have her come in and get re-evaluated.

## 2020-02-26 NOTE — Telephone Encounter (Signed)
Thank you :)

## 2020-02-26 NOTE — Telephone Encounter (Signed)
Called pt - stated she will try to go to work tomorrow but can come afterwards. Appt scheduled tomorrow 10/27 @ 1545 PM with Dr Mcarthur Rossetti.

## 2020-02-26 NOTE — Telephone Encounter (Signed)
Return pt's call - states she continues to cough even after being seen in [facility] then ED and talking to on-call doctor. Coughing up phlegm and c/o congestion. States she needs to go back to work. Sounds sob when talking but states she does not think so; also shehas been using an inhaler. Also states her chest feels heavy. She was tested neg for Covid 6 days ago. Denies chills, fever or any other symptoms. CT chest was done on 10/20. Please call pt - she wants to know what to do. Thanks

## 2020-02-26 NOTE — Telephone Encounter (Signed)
Pt reporting a non- stop cough for several days.  Pt states she had a COVID in the ED on 02/2020 which was negative.  Pt requesting a call back.

## 2020-02-27 ENCOUNTER — Other Ambulatory Visit: Payer: Self-pay | Admitting: Internal Medicine

## 2020-02-27 ENCOUNTER — Ambulatory Visit (INDEPENDENT_AMBULATORY_CARE_PROVIDER_SITE_OTHER): Payer: Self-pay | Admitting: Internal Medicine

## 2020-02-27 ENCOUNTER — Encounter: Payer: Self-pay | Admitting: Internal Medicine

## 2020-02-27 VITALS — BP 136/79 | HR 73 | Temp 98.1°F | Ht <= 58 in | Wt 159.5 lb

## 2020-02-27 DIAGNOSIS — R059 Cough, unspecified: Secondary | ICD-10-CM

## 2020-02-27 DIAGNOSIS — I10 Essential (primary) hypertension: Secondary | ICD-10-CM

## 2020-02-27 MED ORDER — BUDESONIDE 180 MCG/ACT IN AEPB: 2.0000 | INHALATION_SPRAY | Freq: Two times a day (BID) | RESPIRATORY_TRACT | 1 refills | Status: DC

## 2020-02-27 MED ORDER — FLUTICASONE-SALMETEROL 250-50 MCG/DOSE IN AEPB: 1.0000 | INHALATION_SPRAY | Freq: Two times a day (BID) | RESPIRATORY_TRACT | 0 refills | Status: DC

## 2020-02-27 MED ORDER — LOSARTAN POTASSIUM 50 MG PO TABS: 50.0000 mg | ORAL_TABLET | Freq: Every day | ORAL | 0 refills | Status: DC

## 2020-02-27 MED FILL — LOSARTAN POTASSIUM 50 MG TA: 50 | 30 days supply | Qty: 30 | Fill #0

## 2020-02-27 MED FILL — ADVAIR 250/50 DISKUS: 250-50 | 30 days supply | Qty: 60 | Fill #0

## 2020-02-27 NOTE — Patient Instructions (Addendum)
Vicki Cook,  It was a pleasure seeing you in clinic. Today we discussed:   Cough: At this time, I will prescribe  Pulmicort inhaler. Please take this twice daily. You may continue taking benzonatate for cough suppression. I am ordering pulmonary function tests to assess for any asthma. Please follow up in 1-2 weeks if your symptoms continue.   Hypertension: Continue losartan 50mg  daily. I am checking labs at this visit. I will call you with any abnormal results.   If you have any questions or concerns, please call our clinic at [phone] between 9am-5pm and after hours call [phone] and ask for the internal medicine resident on call. If you feel you are having a medical emergency please call 911.   Thank you, we look forward to helping you remain healthy!

## 2020-02-27 NOTE — Progress Notes (Signed)
   CC: persistent  Cough   HPI:  [name] is a 59 y.o. female with PMHx as listed below presenting for persistent cough. Patient has been evaluated for this multiple times and has been treated with antihistamine, PPI, antibiotics and steroids. However, notes persistent cough.  Please see problem based charting for complete assessment and plan.  Past Medical History:  Diagnosis Date  . H/O pinworm infection   . Hypertension 2012  . Rheumatoid arthritis (HCC) 2017   Review of Systems:  Negative except as stated in HPI.  Physical Exam:  Vitals:   02/27/20 1555  Weight: 159 lb 8 oz (72.3 kg)  Height: 4\' 10"  (1.473 m)   Physical Exam  Constitutional: Appears well-developed and well-nourished. No distress.  HENT: Normocephalic and atraumatic, EOMI, conjunctiva normal, moist mucous membranes; nonerythematous posterior pharynx  Cardiovascular: Normal rate, regular rhythm, S1 and S2 present, no murmurs, rubs, gallops.  Distal pulses intact Respiratory: No respiratory distress, no accessory muscle use.  Effort is normal.  Lungs are clear to auscultation bilaterally. Skin: Warm and dry.  No rash, erythema, lesions noted.   Assessment & Plan:   See Encounters Tab for problem based charting.  Patient discussed with Dr. 

## 2020-02-28 NOTE — Assessment & Plan Note (Signed)
BP Readings from Last 3 Encounters:  02/27/20 136/79  02/20/20 (!) 172/106  02/14/20 (!) 176/98   Patient noted to be hypertensive at prior visit and advised to increase losartan to 50mg  daily. BP improved to 136/79 at this visit. She denies any concerns at this time.   Plan: Continue losartan 50mg  daily  Will need BMP at next visit

## 2020-02-28 NOTE — Assessment & Plan Note (Signed)
Ms Vicki Cook is following up for her persistent cough. She was initially evaluated for this on 10/12 when she endorsed one month of cough. At the time, patient was treated with loratadine and protonix as she endorsed association with spicy food and previous improvement with antihistamine therapy. She did not have any fevers at this time and her lung exam was benign. However, her cough persisted. On 10/17, patient notes that she went to the [facility] where chest x-ray was performed and noted to have bilateral infiltrates. She was treated with levofloxacin and prednisone and also given albuterol inhaler as needed. She noted some improvement with this but due to persistent symptoms, she presented to the ED on 10/20 for her cough. At this time, CT Chest was performed which was negative for PE or other infiltrates. Patient was discharged to finish her course of antibiotics. Patient presents to the office today for persistent chronic dry cough. She notes that she did have some improvement in symptoms with the levofloxacin and prednisone; however, continues to have the cough. She denies any fevers or chills. She has eliminated spicy food from her diet.  On examination, her lungs are clear to auscultation. Posterior pharynx without erythema. Peak flow meter with maximum 250 L/min suggestive of possible obstructive lung disease etiology. However, patient has not had formal PFT evaluation.  At this time, suspect possible cough variant asthma vs  Eosinophilic asthma  Plan:  - Trial of inhaled corticosteroids  - Follow up in 2 weeks  - Will place order for formal PFTs

## 2020-03-03 ENCOUNTER — Other Ambulatory Visit: Payer: Self-pay

## 2020-03-03 ENCOUNTER — Ambulatory Visit: Payer: Self-pay | Attending: Podiatry | Admitting: Podiatry

## 2020-03-03 DIAGNOSIS — M21621 Bunionette of right foot: Secondary | ICD-10-CM

## 2020-03-03 DIAGNOSIS — M2060 Acquired deformities of toe(s), unspecified, unspecified foot: Secondary | ICD-10-CM

## 2020-03-03 DIAGNOSIS — L84 Corns and callosities: Secondary | ICD-10-CM

## 2020-03-03 DIAGNOSIS — M21622 Bunionette of left foot: Secondary | ICD-10-CM

## 2020-03-03 NOTE — Progress Notes (Signed)
Subjective:   Patient ID: Vicki Cook, female   DOB: 59 y.o.   MRN: [id number]   HPI Vicki Cook presents to the office today for concerns of calluses to both of her feet as well as pain.  She said that she works several hours a day and she is on her feet quite a bit which caused the discomfort.  She has seen internal medicine have been tried which helped for short amount of time before the calluses came back.  She denies recent injury or falls.  She has no other concerns today.  She has rheumatoid arthritis and originally she thought the pain was coming from the arthritis.   Review of Systems  All other systems reviewed and are negative.  Past Medical History:  Diagnosis Date   H/O pinworm infection    Hypertension 2012   Rheumatoid arthritis (HCC) 2017    No past surgical history on file.   Current Outpatient Medications:    Fluticasone-Salmeterol (ADVAIR DISKUS) 250-50 MCG/DOSE AEPB, Inhale 1 puff into the lungs in the morning and at bedtime., Disp: 60 each, Rfl: 0   loratadine (CLARITIN) 10 MG tablet, Take 1 tablet (10 mg total) by mouth daily as needed for allergies or rhinitis., Disp: 30 tablet, Rfl: 2   losartan (COZAAR) 50 MG tablet, Take 1 tablet (50 mg total) by mouth daily. PATIENT MUST BE SEEN BY [facility] ASAP FOR ADDITIONAL REFILLS, Disp: 90 tablet, Rfl: 0   triamcinolone ointment (KENALOG) 0.1 %, Apply to affected areas twice daily as needed, Disp: 80 g, Rfl: 3  Allergies  Allergen Reactions   Asa [Aspirin] Nausea And Vomiting and Other (See Comments)    Burning in stomach    Lisinopril Other (See Comments)    Had pain in her knees          Objective:  Physical Exam  General: AAO x3, NAD  Dermatological: Hyperkeratotic tissue along the right fifth metatarsal head plantarly as well as minimally on the left fifth toe.  Mild hyperkeratotic tissue on the medial hallux bilaterally.  Hyperkeratotic lesion also on the fourth toe left  foot.  There is no underlying ulceration drainage or signs of infection.  Vascular: Dorsalis Pedis artery and Posterior Tibial artery pedal pulses are 2/4 bilateral with immedate capillary fill time. There is no pain with calf compression, swelling, warmth, erythema.   Neruologic: Grossly intact via light touch bilateral.   Musculoskeletal: Digital deformity present with fourth toe.  Tailor's bunions are also evident.  Muscular strength 5/5 in all groups tested bilateral.  Gait: Unassisted, Nonantalgic.       Assessment:   Hyperkeratotic lesion, bilateral foot pain    Plan:  -Treatment options discussed including all alternatives, risks, and complications -Etiology of symptoms were discussed -Calluses sharp debrided without any complications or bleeding.  I will have her come by the office tomorrow to get her offloading pads, metatarsal pads as well as fourth toe pads.  Also the miracle foot cream for moisturizer to the calluses.  Discussed shoe modifications.  She is able to purchase new shoes to help decrease the pressure.   Vivi Barrack DPM

## 2020-03-03 NOTE — Progress Notes (Signed)
Internal Medicine Clinic Attending ° °Case discussed with Dr. Aslam  At the time of the visit.  We reviewed the resident’s history and exam and pertinent patient test results.  I agree with the assessment, diagnosis, and plan of care documented in the resident’s note.  °

## 2020-03-04 NOTE — Telephone Encounter (Signed)
Note made in error

## 2020-03-12 ENCOUNTER — Encounter: Payer: Self-pay | Admitting: Internal Medicine

## 2020-03-24 ENCOUNTER — Encounter: Payer: Self-pay | Admitting: Internal Medicine

## 2020-03-31 ENCOUNTER — Encounter: Payer: Self-pay | Admitting: Internal Medicine

## 2020-04-30 ENCOUNTER — Telehealth: Payer: Self-pay

## 2020-04-30 NOTE — Telephone Encounter (Signed)
Advised pt to go to urg care if in pain, she agreed

## 2020-04-30 NOTE — Telephone Encounter (Signed)
Pt needs a call back  Water got into her ear ( left )  No draining but pain

## 2020-05-07 ENCOUNTER — Ambulatory Visit (INDEPENDENT_AMBULATORY_CARE_PROVIDER_SITE_OTHER): Payer: Self-pay | Admitting: Internal Medicine

## 2020-05-07 ENCOUNTER — Encounter: Payer: Self-pay | Admitting: Internal Medicine

## 2020-05-07 ENCOUNTER — Ambulatory Visit: Payer: Self-pay

## 2020-05-07 ENCOUNTER — Other Ambulatory Visit: Payer: Self-pay | Admitting: Internal Medicine

## 2020-05-07 ENCOUNTER — Other Ambulatory Visit: Payer: Self-pay

## 2020-05-07 VITALS — BP 147/92 | HR 90 | Temp 98.0°F | Ht 60.0 in | Wt 158.2 lb

## 2020-05-07 DIAGNOSIS — R29898 Other symptoms and signs involving the musculoskeletal system: Secondary | ICD-10-CM

## 2020-05-07 DIAGNOSIS — R519 Headache, unspecified: Secondary | ICD-10-CM

## 2020-05-07 DIAGNOSIS — I10 Essential (primary) hypertension: Secondary | ICD-10-CM

## 2020-05-07 MED ORDER — LOSARTAN POTASSIUM 50 MG PO TABS: 50.0000 mg | ORAL_TABLET | Freq: Every day | ORAL | 0 refills | Status: DC

## 2020-05-07 MED FILL — LOSARTAN POTASSIUM 50 MG TA: 50 | 30 days supply | Qty: 30 | Fill #0

## 2020-05-07 NOTE — Progress Notes (Signed)
   CC: ear pain  HPI:  [name] is a 60 y.o. with PMH as below.   Please see A&P for assessment of the patient's acute and chronic medical conditions.   She has been having left external ear/jaw pain for about one month. She denies clicking when she opens her jaw, no pain with opening the jaw. No inner ear pain, change in hearing, headache, change in vision.   She has also has sudden onset sore neck and shoulder muscles that feel very weak. This happens intermittently. She has been working and lifting more. She is unable to lift her arms above 90 degrees and normally can lift them much higher. No shoulder pain. No rashes that's she's noticed. She has a history of RA and frequently has morning stiffness and frequently has aching in her hands. She was unable to f/u with rheumatology due to not having insurance. She previously tried methotrexate which she felt did not help and stopped.   Past Medical History:  Diagnosis Date  . H/O pinworm infection   . Hypertension 2012  . Rheumatoid arthritis (HCC) 2017   Review of Systems:   10 point ROS negative except as noted in HPI  Physical Exam:   Constitution: NAD, appears stated age HENT: no clicking or popping, misalignment or swelling of jaw, tenderness of left superior TMJ joint extending up to temple. Left TM normal, ear canal clear; right TM not visible due to wax Eyes: no icterus or injection Cardio: RRR, no m/r/g, no LE edema  Respiratory: CTA, no w/r/r Abdominal: NTTP, soft, non-distended MSK:  UE: full passive ROM, slight TTP over neck muscles, trapezius, deltoids; unable to abduct arms above 90 degrees, no significant shoulder pain with active movement  Neuro: normal affect, a&ox3 Skin: c/d/i    Vitals:   05/07/20 1542 05/07/20 1548  BP: (!) 144/86 (!) 147/92  Pulse: 81 90  Temp: 98 F (36.7 C)   TempSrc: Oral   SpO2: 100%   Weight: 158 lb 3.2 oz (71.8 kg)   Height: 5' (1.524 m)     Assessment & Plan:    See Encounters Tab for problem based charting.  Patient discussed with Dr. Criselda Peaches

## 2020-05-07 NOTE — Patient Instructions (Addendum)
Thank you for allowing [place] to provide your care today. Today we discussed your left jaw pain and blood pressure    I have ordered the following labs for you:  Inflammatory labs   I will call if any are abnormal.    Today we made the following changes to your medications:   Please START taking  Losartan 50 mg qd  Please STOP taking   Verapamil  Please follow-up on January 11th for blood pressure check.    Please call the internal medicine center clinic if you have any questions or concerns, we may be able to help and keep you from a long and expensive emergency room wait. Our clinic and after hours phone number is [phone], the best time to call is Monday through Friday 9 am to 4 pm but there is always someone available 24/7 if you have an emergency. If you need medication refills please notify your pharmacy one week in advance and they will send [place] a request.

## 2020-05-08 DIAGNOSIS — R29898 Other symptoms and signs involving the musculoskeletal system: Secondary | ICD-10-CM | POA: Insufficient documentation

## 2020-05-08 DIAGNOSIS — R519 Headache, unspecified: Secondary | ICD-10-CM

## 2020-05-08 HISTORY — DX: Headache, unspecified: R51.9

## 2020-05-08 HISTORY — DX: Other symptoms and signs involving the musculoskeletal system: R29.898

## 2020-05-08 LAB — CMP14 + ANION GAP
ALT: 18 IU/L (ref 0–32)
AST: 17 IU/L (ref 0–40)
Albumin/Globulin Ratio: 1 — ABNORMAL LOW (ref 1.2–2.2)
Albumin: 3.8 g/dL (ref 3.8–4.9)
Alkaline Phosphatase: 107 IU/L (ref 44–121)
Anion Gap: 18 mmol/L (ref 10.0–18.0)
BUN/Creatinine Ratio: 15 (ref 9–23)
BUN: 7 mg/dL (ref 6–24)
Bilirubin Total: 0.2 mg/dL (ref 0.0–1.2)
CO2: 20 mmol/L (ref 20–29)
Calcium: 9.2 mg/dL (ref 8.7–10.2)
Chloride: 102 mmol/L (ref 96–106)
Creatinine, Ser: 0.47 mg/dL — ABNORMAL LOW (ref 0.57–1.00)
GFR calc Af Amer: 127 mL/min/{1.73_m2} (ref >59)
GFR calc non Af Amer: 110 mL/min/{1.73_m2} (ref >59)
Globulin, Total: 3.9 g/dL (ref 1.5–4.5)
Glucose: 88 mg/dL (ref 65–99)
Potassium: 3.8 mmol/L (ref 3.5–5.2)
Sodium: 140 mmol/L (ref 134–144)
Total Protein: 7.7 g/dL (ref 6.0–8.5)

## 2020-05-08 LAB — SEDIMENTATION RATE: Sed Rate: 103 mm/h — ABNORMAL HIGH (ref 0–40)

## 2020-05-08 LAB — CK: Total CK: 47 U/L (ref 32–182)

## 2020-05-08 LAB — C-REACTIVE PROTEIN: CRP: 99 mg/L — ABNORMAL HIGH (ref 0–10)

## 2020-05-08 NOTE — Assessment & Plan Note (Signed)
BP Readings from Last 3 Encounters:  05/07/20 (!) 147/92  02/27/20 136/79  02/20/20 (!) 172/106   She went to urgent care several months ago very ill with cough and possible pneumonia and was switched from her losartan 50 mg to verapamil as her blood pressure was very elevated that day.  - stop verapamil, restart losartan 50 mg  - f/u in one week for bp check

## 2020-05-08 NOTE — Assessment & Plan Note (Signed)
She has also has sudden onset sore neck and shoulder muscles that feel very weak. This happens intermittently. She has been working and lifting more. She is unable to lift her arms above 90 degrees and normally can lift them much higher. No shoulder pain. No rashes that's she's noticed. She has a history of RA and frequently has morning stiffness and frequently has aching in her hands. She was unable to f/u with rheumatology due to not having insurance. She previously tried methotrexate which she felt did not help and stopped.  She has full passive ROM but is unable to actively abduct arms bilaterally above 90 degrees. No pain with palpation or movement except for slight muscle tenderness along her upper back and neck muscles.  She is possibly just sore from heavy lifting or secondary to her RA. Differential also includes PMR, or inflammatory myositis.   - obtain CK, ESR, CRP, and CMP.

## 2020-05-08 NOTE — Assessment & Plan Note (Signed)
She has been having left external ear/jaw pain for about one month. She denies clicking when she opens her jaw, no pain with opening the jaw. No inner ear pain, change in hearing, headache, change in vision.  Ear canal and TM within normal limits on left. Tenderness over superior TMJ joint up to up to temporal region. Jaw does not pop with movement. She has a history of RA so I do anticipate that she will have elevation of her inflammatory markers but will assess for significant increase.   - ESR and CRP

## 2020-05-12 NOTE — Progress Notes (Signed)
Internal Medicine Clinic Attending  Case discussed with Dr. Seawell  At the time of the visit.  We reviewed the resident's history and exam and pertinent patient test results.  I agree with the assessment, diagnosis, and plan of care documented in the resident's note.  

## 2020-05-13 ENCOUNTER — Ambulatory Visit (INDEPENDENT_AMBULATORY_CARE_PROVIDER_SITE_OTHER): Payer: Self-pay | Admitting: Internal Medicine

## 2020-05-13 ENCOUNTER — Other Ambulatory Visit: Payer: Self-pay | Admitting: Internal Medicine

## 2020-05-13 ENCOUNTER — Ambulatory Visit: Payer: Self-pay

## 2020-05-13 VITALS — BP 144/95 | HR 92 | Temp 97.9°F | Wt 153.8 lb

## 2020-05-13 DIAGNOSIS — R29898 Other symptoms and signs involving the musculoskeletal system: Secondary | ICD-10-CM

## 2020-05-13 DIAGNOSIS — R519 Headache, unspecified: Secondary | ICD-10-CM

## 2020-05-13 MED ORDER — PREDNISONE 20 MG PO TABS: 40.0000 mg | ORAL_TABLET | Freq: Every day | ORAL | 0 refills | Status: DC

## 2020-05-13 MED FILL — predniSONE 20 MG TABS: 20 | 30 days supply | Qty: 60 | Fill #0

## 2020-05-13 NOTE — Patient Instructions (Addendum)
Ms. Kupfer, It was nice meeting you.   For your shoulder and ear pain, We will need to get a biopsy to rule out a condition called temporal artery arteritis. If this is negative, we can stop the steroids and figure out how to best treat your arthritis.   Take care, Dr. Chesley Mires

## 2020-05-15 ENCOUNTER — Encounter: Payer: Self-pay | Admitting: Internal Medicine

## 2020-05-15 NOTE — Assessment & Plan Note (Signed)
Exam is unchanged from a week ago with active abduction of both shoulders limited to 90 degrees. She has full passive ROM. Continues to have muscle tenderness along upper back and neck muscles.  CMP and CK were unremarkable, but as mentioned, her inflammatory markers are quite elevated. 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 She has been started on higher dose steroids until temporal artery biopsy is done to rule out temporal arteritis. If this negative, can lower the dose and attempt slow taper based on clinical and laboratory improvement.

## 2020-05-15 NOTE — Progress Notes (Signed)
Acute Office Visit  Subjective:    Patient ID: Vicki Cook, female    DOB: [birth year], 60 y.o.   MRN: [id number]  Chief Complaint  Patient presents with  . Acquired Absence Of Right Breast  . Cerumen Impaction  . Hypertension    HPI Patient is in today for follow-up on temporal pain and shoulder pain. Please see problem based charting for details on today's visit.   Past Medical History:  Diagnosis Date  . H/O pinworm infection   . Hypertension 2012  . Rheumatoid arthritis ([place]) 2017    History reviewed. No pertinent surgical history.  Family History  Problem Relation Age of Onset  . Heart disease Mother   . Hyperlipidemia Mother   . Hypertension Mother   . Heart disease Father   . Hypertension Father   . Diabetes Father     Social History   Socioeconomic History  . Marital status: Married    Spouse name: Not on file  . Number of children: 2  . Years of education: 43  . Highest education level: Some college, no degree  Occupational History  . 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 Smoking status: Never Smoker  . Smokeless tobacco: Never Used  Vaping Use  . Vaping Use: Never used  Substance and Sexual Activity  . Alcohol use: No    Alcohol/week: 0.0 standard drinks  . Drug use: No  . Sexual activity: Not on file    Comment: has not had sex for several years  Other Topics Concern  . Not on file  Social History Narrative   Originally from    [place]   Has lived in [place]. Since 1999   2 young adult sons   Husband was a professor at [employer], but imprisoned for illegal internet activity.  Patient states he was unfairly targeted.  Tried to work with Leanne Chang to have his conviction overturned.  He was deported after release in recent years.   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   Social Determinants of Health   Financial Resource Strain: Not on file  Food Insecurity: Not on file  Transportation Needs: Not on file   Physical Activity: Not on file  Stress: Not on file  Social Connections: Not on file  Intimate Partner Violence: Not on file    Outpatient Medications Prior to Visit  Medication Sig Dispense Refill  . Fluticasone-Salmeterol (ADVAIR DISKUS) 250-50 MCG/DOSE AEPB Inhale 1 puff into the lungs in the morning and at bedtime. 60 each 0  . loratadine (CLARITIN) 10 MG tablet Take 1 tablet (10 mg total) by mouth daily as needed for allergies or rhinitis. 30 tablet 2  . losartan (COZAAR) 50 MG tablet Take 1 tablet (50 mg total) by mouth daily. PATIENT MUST BE SEEN BY [facility] ASAP FOR ADDITIONAL REFILLS 90 tablet 0  . triamcinolone ointment (KENALOG) 0.1 % Apply to affected areas twice daily as needed 80 g 3   No facility-administered medications prior to visit.    Allergies  Allergen Reactions  . Asa [Aspirin] Nausea And Vomiting and Other (See Comments)    Burning in stomach   . Lisinopril Other (See Comments)    Had pain in her knees     Review of Systems  Constitutional: Negative for chills and fever.  Eyes: Negative for visual disturbance.  Musculoskeletal: Positive for arthralgias and gait problem. Negative for joint swelling.  Skin: Negative for rash.  Neurological: Negative for numbness and headaches.       Objective:    Physical Exam Constitutional:      General: She is not in acute distress.    Appearance: Normal appearance.  HENT:     Right Ear: There is impacted cerumen.     Left Ear: There is impacted cerumen.     Ears:     Comments: Tenderness to palpation anterior to tragus on the left.  Musculoskeletal:     Right shoulder: Tenderness present. Decreased range of motion.     Left shoulder: Tenderness present. Decreased range of motion.     Comments: Patient limited to 90 degrees of abduction bilaterally. Tenderness to palpation of muscles surrounding both shoulders    Skin:    Findings: No rash.  Neurological:     General: No focal deficit  present.     Mental Status: She is alert.     BP (!) 144/95 (BP Location: Right Arm, Patient Position: Sitting, Cuff Size: Normal)   Pulse 92   Temp 97.9 F (36.6 C) (Oral)   Wt 153 lb 12.8 oz (69.8 kg)   SpO2 97%   BMI 30.04 kg/m  Wt Readings from Last 3 Encounters:  05/13/20 153 lb 12.8 oz (69.8 kg)  05/07/20 158 lb 3.2 oz (71.8 kg)  02/27/20 159 lb 8 oz (72.3 kg)    Health Maintenance Due  Topic Date Due  . TETANUS/TDAP  Never done  . PAP SMEAR-Modifier  Never done  . COLONOSCOPY (Pts 45-2yr Insurance coverage will need to be confirmed)  Never done  . COLON CANCER SCREENING ANNUAL FOBT  03/03/2016  . MAMMOGRAM  08/25/2017  . INFLUENZA VACCINE  12/02/2019  . COVID-19 Vaccine (3 - Booster for Pfizer series) 02/19/2020    There are no preventive care reminders to display for this patient.   Lab Results  Component Value Date   TSH 2.056 07/19/2014   Lab Results  Component Value Date   WBC 16.7 (H) 02/20/2020   HGB 12.5 02/20/2020   HCT 39.3 02/20/2020   MCV 79.2 (L) 02/20/2020   PLT 377 02/20/2020   Lab Results  Component Value Date   NA 140 05/07/2020   K 3.8 05/07/2020   CO2 20 05/07/2020   GLUCOSE 88 05/07/2020   BUN 7 05/07/2020   CREATININE 0.47 (L) 05/07/2020   BILITOT 0.2 05/07/2020   ALKPHOS 107 05/07/2020   AST 17 05/07/2020   ALT 18 05/07/2020   PROT 7.7 05/07/2020   ALBUMIN 3.8 05/07/2020   CALCIUM 9.2 05/07/2020   ANIONGAP 12 02/20/2020   Lab Results  Component Value Date   CHOL 152 11/23/2019   Lab Results  Component Value Date   HDL 53 11/23/2019   Lab Results  Component Value Date   LDLCALC 88 11/23/2019   Lab Results  Component Value Date   TRIG 53 11/23/2019   Lab Results  Component Value Date   CHOLHDL 2.9 11/23/2019   Lab Results  Component Value Date   HGBA1C 6.2 (H) 02/07/2014       Assessment & Plan:   Problem List Items Addressed This Visit      Other   Temporal pain - Primary    Patient presents for  close follow-up on left external ear/jaw pain from visit on 1/6. There has been no worsening of symptoms, but pain is still persistent.  Labs showed significantly elevated ESR and CRP. Given her concurrent shoulder pain and weakness concerning for PMR,  this raises the concern for temporal arteritis.  Will start Prednisone 40 mg daily and refer her to vascular surgery for temporal artery biopsy.       Relevant Orders   Ambulatory referral to Vascular Surgery   Shoulder weakness    Exam is unchanged from a week ago with active abduction of both shoulders limited to 90 degrees. She has full passive ROM. Continues to have muscle tenderness along upper back and neck muscles.  CMP and CK were unremarkable, but as mentioned, her inflammatory markers are quite elevated. 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 She has been started on higher dose steroids until temporal artery biopsy is done to rule out temporal arteritis. If this negative, can lower the dose and attempt slow taper based on clinical and laboratory improvement.            Meds ordered this encounter  Medications  . predniSONE (DELTASONE) 20 MG tablet    Sig: Take 2 tablets (40 mg total) by mouth daily with breakfast.    Dispense:  60 tablet    Refill:  0    IM program     Estefan Pattison D Tallis Soledad, DO

## 2020-05-15 NOTE — Assessment & Plan Note (Signed)
Patient presents for close follow-up on left external ear/jaw pain from visit on 1/6. There has been no worsening of symptoms, but pain is still persistent.  Labs showed significantly elevated ESR and CRP. Given her concurrent shoulder pain and weakness concerning for PMR, this raises the concern for temporal arteritis.  Will start Prednisone 40 mg daily and refer her to vascular surgery for temporal artery biopsy.

## 2020-05-20 NOTE — Progress Notes (Signed)
Internal Medicine Clinic Attending  Case discussed with Dr. Bloomfield  At the time of the visit.  We reviewed the resident's history and exam and pertinent patient test results.  I agree with the assessment, diagnosis, and plan of care documented in the resident's note.  

## 2020-06-17 ENCOUNTER — Encounter: Payer: Self-pay | Admitting: Vascular Surgery

## 2020-06-19 ENCOUNTER — Other Ambulatory Visit: Payer: Self-pay | Admitting: Student

## 2020-06-19 ENCOUNTER — Other Ambulatory Visit: Payer: Self-pay | Admitting: Internal Medicine

## 2020-06-19 MED ORDER — PREDNISONE 20 MG PO TABS: 40.0000 mg | ORAL_TABLET | Freq: Every day | ORAL | 0 refills | Status: DC

## 2020-06-19 MED FILL — predniSONE 20 MG TABS: 20 | 5 days supply | Qty: 10 | Fill #0

## 2020-06-19 MED FILL — LOSARTAN POTASSIUM 50 MG TA: 50 | 30 days supply | Qty: 30 | Fill #1

## 2020-06-19 NOTE — Telephone Encounter (Signed)
I will give her a 5 day course to hold her over until she needs to be seen. This is a complicated situation and she needs to be evaluated before we decide to taper her off the steroids. Please schedule her for an appointment as soon as possible. Thank you!  Dr. Kirtland Bouchard.

## 2020-06-23 NOTE — Telephone Encounter (Signed)
Patient HAS to be seen in the clinic for further assessment and tapering of her prednisone.  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  She was referred to vascular surgery but appears there are some barriers preventing her from going.  Is there any way that we can get her into the clinic for an appointment to taper down her steroids this week?  Given the chronic adverse effects of glucocorticoids.  Dr. Evie Lacks was already given a 5-day course prednisone. In the event she still insists on not making it.  We will slowly taper down her steroid dose by 5 mg increments per week.  Thanks.

## 2020-06-23 NOTE — Telephone Encounter (Signed)
Appt scheduled with me tomorrow. Thank you Glenda and Dr. Dortha Schwalbe

## 2020-06-23 NOTE — Telephone Encounter (Signed)
I talked to [place], [facility] stated there's nothing she can do now (visit cannot be retroactive); pt suppose to being in additional paperwork tomorrow. And pt insists of not making an appt.  Other options, talking to [employer] to [employer]. Thanls

## 2020-06-24 ENCOUNTER — Encounter: Payer: Self-pay | Admitting: Vascular Surgery

## 2020-06-24 ENCOUNTER — Ambulatory Visit (INDEPENDENT_AMBULATORY_CARE_PROVIDER_SITE_OTHER): Payer: Self-pay | Admitting: Student

## 2020-06-24 ENCOUNTER — Other Ambulatory Visit: Payer: Self-pay | Admitting: Student

## 2020-06-24 ENCOUNTER — Encounter: Payer: Self-pay | Admitting: Student

## 2020-06-24 DIAGNOSIS — R29898 Other symptoms and signs involving the musculoskeletal system: Secondary | ICD-10-CM

## 2020-06-24 DIAGNOSIS — R519 Headache, unspecified: Secondary | ICD-10-CM

## 2020-06-24 DIAGNOSIS — I10 Essential (primary) hypertension: Secondary | ICD-10-CM

## 2020-06-24 MED ORDER — PREDNISONE 20 MG PO TABS: ORAL_TABLET | ORAL | 0 refills | Status: DC

## 2020-06-24 MED ORDER — LOSARTAN POTASSIUM 50 MG PO TABS: 50.0000 mg | ORAL_TABLET | Freq: Every day | ORAL | 0 refills | Status: DC

## 2020-06-24 MED FILL — predniSONE 20 MG TABS: 20 | 21 days supply | Qty: 32 | Fill #0

## 2020-06-24 NOTE — Assessment & Plan Note (Signed)
Assessment: Patient endorses improvement of shoulder weakness with prednisone therapy. During prior visits, PMR suspected. Will continue with steroid taper. Plan to taper 10 mg at a time. Will start with 30 mg for 21 days, 20 mg 21 days, and 10 mg 21 days. If patient has pain/weakness return, will increase prednisone to prior dose and slowly taper off for additional 2 weeks.   Plan: -steroid taper plan as per above.

## 2020-06-24 NOTE — Progress Notes (Signed)
CC: Left Jaw/Right arm pain  HPI:  Vicki Cook is a 60 y.o. female with a past medical history stated below and presents today for follow up on her left jaw/right arm pain. Please see problem based assessment and plan for additional details.  Past Medical History:  Diagnosis Date  . H/O pinworm infection   . Hypertension 2012  . Rheumatoid arthritis (HCC) 2017    Current Outpatient Medications on File Prior to Visit  Medication Sig Dispense Refill  . Fluticasone-Salmeterol (ADVAIR DISKUS) 250-50 MCG/DOSE AEPB Inhale 1 puff into the lungs in the morning and at bedtime. 60 each 0  . loratadine (CLARITIN) 10 MG tablet Take 1 tablet (10 mg total) by mouth daily as needed for allergies or rhinitis. 30 tablet 2  . triamcinolone ointment (KENALOG) 0.1 % Apply to affected areas twice daily as needed 80 g 3   No current facility-administered medications on file prior to visit.    Family History  Problem Relation Age of Onset  . Heart disease Mother   . Hyperlipidemia Mother   . Hypertension Mother   . Heart disease Father   . Hypertension Father   . Diabetes Father     Social History   Socioeconomic History  . Marital status: Married    Spouse name: Not on file  . Number of children: 2  . Years of education: 19  . Highest education level: Some college, no degree  Occupational History  . 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 Smoking status: Never Smoker  . Smokeless tobacco: Never Used  Vaping Use  . Vaping Use: Never used  Substance and Sexual Activity  . Alcohol use: No    Alcohol/week: 0.0 standard drinks  . Drug use: No  . Sexual activity: Not on file    Comment: has not had sex for several years  Other Topics Concern  . Not on file  Social History Narrative   Originally from    [place]   Has lived in [place]. Since 1999   2 young adult sons   Husband was a professor at [employer], but imprisoned for illegal internet activity.  Patient  states he was unfairly targeted.  Tried to work with Suzie Portela to have his conviction overturned.  He was deported after release in recent years.   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   Social Determinants of Health   Financial Resource Strain: Not on file  Food Insecurity: Not on file  Transportation Needs: Not on file  Physical Activity: Not on file  Stress: Not on file  Social Connections: Not on file  Intimate Partner Violence: Not on file    Review of Systems: ROS negative except for what is noted on the assessment and plan.  Vitals:   06/24/20 1035 06/24/20 1101  BP: (!) 156/100 (!) 153/98  Pulse: 77 75  Temp: 98.2 F (36.8 C)   TempSrc: Oral   SpO2: 100%   Weight: 163 lb 11.2 oz (74.3 kg)   Height: 5' (1.524 m)      Physical Exam: Constitutional: well-appearing, sitting in chair comfortably. HENT: normocephalic atraumatic, mucous membranes moist. No dental abscess/caries present. Tender to palpate over left mandibular angle and posterior to this point. Eyes: conjunctiva non-erythematous Neck: supple Cardiovascular: regular rate Pulmonary/Chest: normal work of breathing on room air MSK: normal bulk and tone Neurological: alert & oriented x 3 Skin: warm and dry Psych: normal mood   Assessment &  Plan:   See Encounters Tab for problem based charting.  Patient discussed with Dr. Avie Echevaria, D.O. [facility], PGY-1 Pager: [phone], Phone: [phone] Date 06/24/2020 Time 6:38 PM

## 2020-06-24 NOTE — Patient Instructions (Signed)
Thank you, [name] for allowing [place] to provide your care today. Today we discussed  Arm Pain/Facial Pain - We will start you on a steroid taper  30 mg for 21 days 20 mg for 21 days 10 mg for 21 days.  At this point, if you feel as though the pain returns or worsens, please give [place] a call and we will put you on a different dose. We will continue to have you be seen by vascular   I wrote for your losartan here for 30 days and will write for 90 days at [facility]  I have ordered the following labs for you:  Lab Orders  No laboratory test(s) ordered today      Referrals ordered today:   Referral Orders  No referral(s) requested today     I have ordered the following medication/changed the following medications:   Stop the following medications: Medications Discontinued During This Encounter  Medication Reason  . losartan (COZAAR) 50 MG tablet Reorder  . losartan (COZAAR) 50 MG tablet   . predniSONE (DELTASONE) 20 MG tablet      Start the following medications: Meds ordered this encounter  Medications  . DISCONTD: losartan (COZAAR) 50 MG tablet    Sig: Take 1 tablet (50 mg total) by mouth daily. PATIENT MUST BE SEEN BY [facility] ASAP FOR ADDITIONAL REFILLS    Dispense:  90 tablet    Refill:  0    IM Program  . losartan (COZAAR) 50 MG tablet    Sig: Take 1 tablet (50 mg total) by mouth daily. PATIENT MUST BE SEEN BY [facility] ASAP FOR ADDITIONAL REFILLS    Dispense:  30 tablet    Refill:  0    IM Program  . predniSONE (DELTASONE) 20 MG tablet    Sig: Take 1.5 tablets (30 mg total) by mouth daily with breakfast for 21 days, THEN 1 tablet (20 mg total) daily with breakfast for 21 days, THEN 0.5 tablets (10 mg total) daily with breakfast for 21 days.    Dispense:  63 tablet    Refill:  0    IM Clinic.     Follow up: 21 days    Remember: Call us if things worsen!  Should you have any questions or concerns please  call the internal medicine clinic at [phone].     Vicki Cook, D.O. [facility]

## 2020-06-24 NOTE — Assessment & Plan Note (Addendum)
BP Readings from Last 3 Encounters:  06/24/20 (!) 153/98  05/13/20 (!) 144/95  05/07/20 (!) 147/92    Assessment: Current regimen of losartan 50 mg. Patient receives medication at [facility] cone pharmacy at discounted price as she is an uninsured [facility] patient. She states it is difficult as she can only receive her losartan in quantities of 30 pills at time. She does not drive and has to depend on others for a ride to the clinic. Found discount on goodrx for patient to get 90 pills for $13. Will send prescription to [facility] on [address].     Plan: -continue losartan 50 mg daily -reassess at next visit in 1-2 months

## 2020-06-24 NOTE — Assessment & Plan Note (Signed)
Assessment: Patient endorses being adherent to prednisone treatment over past month, 40 mg prednisone. She notes improvement of her jaw pain and shoulder weakness while on the steroids. She was unable to meet with vascular surgery as she was unable to afford the co-pay. She is working with our financial services to apply for CAFA.   Due to the improvement of her symptoms and left sided jaw pain with steroids, will begin to slowly taper steroids over coming weeks. Plan to decrease by 10 mg every 3 weeks as tolerable. If pain returns, will increase to prior dose and try to taper again in 2 additional weeks. The goal is to get patient off of steroids or on minimal dose without pain.   Will continue to try and have patient be seen by vascular surgery ASAP if possible.  Plan: -steroid taper, start 30 mg daily for 3 weeks, then decrease to 20 mg for 3 weeks, then 10 mg for 3 weeks. Reassess at this point and if improvement, can discontinue steroids.  -If pain returns, increase to prior steroid dose.  -vascular consult in place

## 2020-06-26 NOTE — Addendum Note (Signed)
Addended by: Erlinda Hong T on: 06/26/2020 02:19 PM   Modules accepted: Level of Service

## 2020-06-26 NOTE — Progress Notes (Signed)
Internal Medicine Clinic Attending  Case discussed with Dr. Katsadouros  At the time of the visit.  We reviewed the resident's history and exam and pertinent patient test results.  I agree with the assessment, diagnosis, and plan of care documented in the resident's note.  

## 2020-07-08 MED FILL — LOSARTAN POTASSIUM 50 MG TA: 50 | 30 days supply | Qty: 30 | Fill #2

## 2020-07-08 MED FILL — predniSONE 20 MG TABS: 20 | 21 days supply | Qty: 32 | Fill #0

## 2020-07-21 ENCOUNTER — Encounter: Payer: Self-pay | Admitting: *Deleted

## 2020-07-21 ENCOUNTER — Encounter: Payer: Self-pay | Admitting: Student

## 2020-07-31 ENCOUNTER — Encounter: Payer: Self-pay | Admitting: *Deleted

## 2020-07-31 NOTE — Progress Notes (Signed)

## 2020-07-31 NOTE — Progress Notes (Signed)
Things That May Be Affecting Your Health:  Alcohol  Hearing loss  Pain    Depression  Home Safety  Sexual Health   Diabetes  Lack of physical activity  Stress   Difficulty with daily activities  Loneliness  Tiredness   Drug use + Medicines  Tobacco use  + Falls  Motor Vehicle Safety  Weight  + Food choices  Oral Health  Other    YOUR PERSONALIZED HEALTH PLAN : 1. Schedule your next subsequent Medicare Wellness visit in one year 2. Attend all of your regular appointments to address your medical issues 3. Complete the preventative screenings and services   Annual Wellness Visit   Medicare Covered Preventative Screenings and Services  Services & Screenings Men and Women Who How Often Need? Date of Last Service Action  Abdominal Aortic Aneurysm Adults with AAA risk factors Once      Alcohol Misuse and Counseling All Adults Screening once a year if no alcohol misuse. Counseling up to 4 face to face sessions.     Bone Density Measurement  Adults at risk for osteoporosis Once every 2 yrs      Lipid Panel Z13.6 All adults without CV disease Once every 5 yrs       Colorectal Cancer   Stool sample or  Colonoscopy All adults 50 and older   Once every year  Every 10 years yes       Depression All Adults Once a year  Today   Diabetes Screening Blood glucose, post glucose load, or GTT Z13.1  All adults at risk  Pre-diabetics  Once per year  Twice per year      Diabetes  Self-Management Training All adults Diabetics 10 hrs first year; 2 hours subsequent years. Requires Copay     Glaucoma  Diabetics  Family history of glaucoma  African Americans 50 yrs +  Hispanic Americans 65 yrs + Annually - requires coppay      Hepatitis C Z72.89 or F19.20  High Risk for HCV  Born between 1945 and 1965  Annually  Once      HIV Z11.4 All adults based on risk  Annually btw ages 36 & 26 regardless of risk  Annually > 65 yrs if at increased risk      Lung Cancer Screening  Asymptomatic adults aged 47-77 with 30 pack yr history and current smoker OR quit within the last 15 yrs Annually Must have counseling and shared decision making documentation before first screen      Medical Nutrition Therapy Adults with   Diabetes  Renal disease  Kidney transplant within past 3 yrs 3 hours first year; 2 hours subsequent years     Obesity and Counseling All adults Screening once a year Counseling if BMI 30 or higher  Today   Tobacco Use Counseling Adults who use tobacco  Up to 8 visits in one year     Vaccines Z23  Hepatitis B  Influenza   Pneumonia  Adults   Once  Once every flu season  Two different vaccines separated by one year     Next Annual Wellness Visit People with Medicare Every year  Today     Services & Screenings Women Who How Often Need  Date of Last Service Action  Mammogram  Z12.31 Women over 40 One baseline ages 33-39. Annually ager 40 yrs+ yes     Pap tests All women Annually if high risk. Every 2 yrs for normal risk women yes  Screening for cervical cancer with   Pap (Z01.419 nl or Z01.411abnl) &  HPV Z11.51 Women aged 12 to 89 Once every 5 yrs     Screening pelvic and breast exams All women Annually if high risk. Every 2 yrs for normal risk women     Sexually Transmitted Diseases  Chlamydia  Gonorrhea  Syphilis All at risk adults Annually for non pregnant females at increased risk         Services & Screenings Men Who How Ofter Need  Date of Last Service Action  Prostate Cancer - DRE & PSA Men over 50 Annually.  DRE might require a copay.        Sexually Transmitted Diseases  Syphilis All at risk adults Annually for men at increased risk      Health Maintenance List Health Maintenance  Topic Date Due  . TETANUS/TDAP  Never done  . PAP SMEAR-Modifier  Never done  . COLONOSCOPY (Pts 45-16yrs Insurance coverage will need to be confirmed)  Never done  . COLON CANCER SCREENING ANNUAL FOBT  03/03/2016  .  MAMMOGRAM  08/25/2017  . INFLUENZA VACCINE  12/02/2019  . COVID-19 Vaccine (3 - Booster for Pfizer series) 02/19/2020  . Hepatitis C Screening  Completed  . HIV Screening  Completed  . HPV VACCINES  Aged Out

## 2020-08-02 ENCOUNTER — Other Ambulatory Visit (HOSPITAL_COMMUNITY): Payer: Self-pay

## 2020-08-06 ENCOUNTER — Other Ambulatory Visit: Payer: Self-pay

## 2020-08-06 ENCOUNTER — Encounter: Payer: Self-pay | Admitting: Family Medicine

## 2020-08-06 ENCOUNTER — Ambulatory Visit: Payer: Self-pay

## 2020-08-06 ENCOUNTER — Ambulatory Visit (INDEPENDENT_AMBULATORY_CARE_PROVIDER_SITE_OTHER): Payer: Self-pay | Admitting: Family Medicine

## 2020-08-06 VITALS — BP 159/93 | Ht 60.0 in | Wt 163.0 lb

## 2020-08-06 DIAGNOSIS — M25512 Pain in left shoulder: Secondary | ICD-10-CM

## 2020-08-06 DIAGNOSIS — R519 Headache, unspecified: Secondary | ICD-10-CM

## 2020-08-06 DIAGNOSIS — M25511 Pain in right shoulder: Secondary | ICD-10-CM

## 2020-08-06 DIAGNOSIS — G8929 Other chronic pain: Secondary | ICD-10-CM

## 2020-08-06 MED ORDER — METHYLPREDNISOLONE ACETATE 40 MG/ML IJ SUSP
40.0000 mg | Freq: Once | INTRAMUSCULAR | Status: AC
  Administered 2020-08-06: 40 mg via INTRA_ARTICULAR

## 2020-08-06 NOTE — Patient Instructions (Signed)
We will refer you to rheumatology for evaluation and further discussion regarding temporal arteritis, treatment options for your rheumatoid arthritis. You do have fluid in your shoulder joints and synovitis, a partial thickness tear of one of your rotator cuff muscles though it's uncertain how old this is. I suspect most of your shoulder weakness, pain, the swelling is due to rheumatologic issues. We can consider steroid injections into your shoulders (and other joints that are inflamed at any particular time) if needed - just let me know. Follow up with me as needed otherwise.

## 2020-08-07 NOTE — Progress Notes (Signed)
PCP: Harvie Heck, MD  Subjective:   HPI: Patient is a 60 y.o. female here for bilateral shoulder pain.  Patient reports for at least a month she's had worsening motion of her shoulders, weakness, pain. Difficulty lifting arms overhead. She tried to do some home exercises with weights but was unable to complete these. No radiation of pain though she does have pain in other joints, known diagnosis in past of rheumatoid arthritis though not taking anything for this currently - per records she took methotrexate for a week back in 2019 and stopped - initial note states she did not like the list of potential side effects, others that she didn't feel like it helped. Recently has had jaw and temporal pain with a CRP of 99 in January, ESR 103.   She has been set up for a temporal artery biopsy but states she does not want to do this.  I encouraged her to contact her PCP about this and she's also interested in seeing rheumatology again now that she has charity care with [facility].  Past Medical History:  Diagnosis Date  . H/O pinworm infection   . Hypertension 2012  . Rheumatoid arthritis ([place]) 2017    Current Outpatient Medications on File Prior to Visit  Medication Sig Dispense Refill  . Fluticasone-Salmeterol (ADVAIR) 250-50 MCG/DOSE AEPB INHALE 1 PUFF INTO THE LUNGS IN THE MORNING AND AT BEDTIME. 60 each 0  . loratadine (CLARITIN) 10 MG tablet Take 1 tablet (10 mg total) by mouth daily as needed for allergies or rhinitis. 30 tablet 2  . losartan (COZAAR) 50 MG tablet TAKE 1 TABLET (50 MG TOTAL) BY MOUTH DAILY. PATIENT MUST BE SEEN BY [facility] ASAP FOR ADDITIONAL REFILLS 90 tablet 0  . predniSONE (DELTASONE) 20 MG tablet TAKE 1.5 TABLETS BY MOUTH DAILY WITH BREAKFAST FOR 21 DAYS, THEN 1 TABLET DAILY WITH BREAKFAST FOR 21 DAYS, THEN 0.5 TAB WITH BREAKFAST FOR 21 DAYS 63 tablet 0  . triamcinolone ointment (KENALOG) 0.1 % Apply to affected areas twice daily as needed 80 g 3   No current  facility-administered medications on file prior to visit.    History reviewed. No pertinent surgical history.  Allergies  Allergen Reactions  . Asa [Aspirin] Nausea And Vomiting and Other (See Comments)    Burning in stomach   . Lisinopril Other (See Comments)    Had pain in her knees     Social History   Socioeconomic History  . Marital status: Married    Spouse name: Not on file  . Number of children: 2  . Years of education: 7  . Highest education level: Some college, no degree  Occupational History  . 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 Smoking status: Never Smoker  . Smokeless tobacco: Never Used  Vaping Use  . Vaping Use: Never used  Substance and Sexual Activity  . Alcohol use: No    Alcohol/week: 0.0 standard drinks  . Drug use: No  . Sexual activity: Not on file    Comment: has not had sex for several years  Other Topics Concern  . Not on file  Social History Narrative   Originally from    [place]   Has lived in [place]. Since 1999   2 young adult sons   Husband was a professor at [employer], but imprisoned for illegal internet activity.  Patient states he was unfairly targeted.  Tried to work with Leanne Chang to have his conviction overturned.  He was deported after release in recent years.   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   Social Determinants of Health   Financial Resource Strain: Not on file  Food Insecurity: Not on file  Transportation Needs: Not on file  Physical Activity: Not on file  Stress: Not on file  Social Connections: Not on file  Intimate Partner Violence: Not on file    Family History  Problem Relation Age of Onset  . Heart disease Mother   . Hyperlipidemia Mother   . Hypertension Mother   . Heart disease Father   . Hypertension Father   . Diabetes Father     BP (!) 159/93   Ht 5' (1.524 m)   Wt 163 lb (73.9 kg)   BMI 31.83 kg/m   No flowsheet data found.  No flowsheet data  found.  Review of Systems: See HPI above.     Objective:  Physical Exam:  Gen: NAD, comfortable in exam room  Right shoulder: No swelling, ecchymoses, warmth, rash.  No gross deformity. TTP anterior and posterior shoulder joint.  No AC joint tenderness. Can only abduct and flex to 60 degrees with mild pain. Mild positive Hawkins, Neers. Negative Yergasons. Unable to position for empty can.  5/5 strength with resisted internal/external rotation. NV intact distally.  Left shoulder: No swelling, ecchymoses, warmth, rash.  No gross deformity. TTP anterior and posterior shoulder joint.  No AC joint tenderness. Can only abduct and flex to 60 degrees with mild pain. Mild positive Hawkins, Neers. Negative Yergasons. Unable to position for empty can.  5/5 strength with resisted internal/external rotation. NV intact distally.  Limited MSK u/s right shoulder: Biceps tendon: moderate tenosynovitis without tear Subscapularis: intact without visible tears Infraspinatus: thin at distal insertion but no obvious tears Supraspinatus: Moderate subacromial bursitis, no visible tears Posterior glenohumeral joint: Mild effusion.  Posterior labrum normal  Impression: moderate subacromial bursitis, mild effusion, moderate biceps tenosynovitis  Limited MSK u/s left shoulder: Biceps tendon: significant tenosynovitis without tendon tear Subscapularis: intact without visible tears Infraspinatus: normal without tears Supraspinatus: cortical irregularity at midpoint of insertion - anechoic signal as well in this area suggestive of partial thickness tear.  Mild bursitis. Posterior glenohumeral joint: mild effusion.  Small calcification in posterior labrum  Impression: significant biceps tenosynovitis, mild effusion, age indeterminate interstitial partial thickness supraspinatus tear.   Assessment & Plan:  1. Bilateral shoulder weakness and pain - I suspect given findings on exam and ultrasound (and  recent labwork) that her pain is due to a rheumatologic issue - discussion of possible PMR in addition to her rheumatoid arthritis.  Also in process of workup for temporal arteritis.  She does not have a rheumatologist - will refer for this.  Given right subacromial injection and left glenohumeral injection today.  After informed written consent timeout was performed, patient was seated in chair in exam room. Right shoulder was prepped with alcohol swab and utilizing lateral approach with ultrasound guidance, patient's right subacromial space was injected with 3:1 lidocaine: depomedrol. Patient tolerated the procedure well without immediate complications.  After informed written consent timeout was performed, patient was seated in chair in exam room. Right shoulder was prepped with alcohol swab and utilizing posterior approach with ultrasound guidance, patient's right glenohumeral space was injected with 3:1 lidocaine: depomedrol. Patient tolerated the procedure well without immediate complications.

## 2020-08-12 ENCOUNTER — Encounter: Payer: Self-pay | Admitting: Vascular Surgery

## 2020-09-19 NOTE — Addendum Note (Signed)
Addended by: Neomia Dear on: 09/19/2020 11:41 AM   Modules accepted: Orders

## 2020-09-23 ENCOUNTER — Other Ambulatory Visit (HOSPITAL_COMMUNITY): Payer: Self-pay

## 2020-09-23 MED FILL — Losartan Potassium Tab 50 MG: ORAL | 30 days supply | Qty: 30 | Fill #0 | Status: AC

## 2020-09-29 NOTE — Progress Notes (Addendum)
Office Visit Note  Patient: Vicki Cook             Date of Birth: 01-26-1961           MRN: [id number]             PCP: Harvie Heck, MD Referring: Dene Gentry, MD Visit Date: 09/30/2020   Subjective:  New Patient (Initial Visit) (Patient has hx of RA, took MTX X 1 week then stopped due to side effects. Patient complains of left shoulder, bilateral hand, and bilateral knee pain. )   History of Present Illness: Vicki Cook is a 60 y.o. female here for evaluation with bilateral shoulder pain and history of rheumatoid arthritis and concern for possible PMR. She was previously seen with sports medicine with shoulder ultrasound consistent with inflammatory changes and treated with intraarticular injection. She was previously evaluated for rheumatoid arthritis and recommended to start methotrexate apparently taking the medicine less than a month. Recent labs with significant inflammatory markers and having some jaw pain so concern of GCA and started prednisone taper since January but never saw vascular surgery for biopsy due to cost issues.  Labs reviewed 05/2020 ESR 103 CRP 99 CK 47 CMP wnl  2017 CCP >250 RF 22  Activities of Daily Living:  Patient reports morning stiffness for 30-60 minutes.   Patient Denies nocturnal pain.  Difficulty dressing/grooming: Reports Difficulty climbing stairs: Reports Difficulty getting out of chair: Denies Difficulty using hands for taps, buttons, cutlery, and/or writing: Denies  Review of Systems  Constitutional: Positive for fatigue.  HENT: Positive for mouth dryness. Negative for mouth sores and nose dryness.   Eyes: Negative for pain, itching, visual disturbance and dryness.  Respiratory: Negative for cough, hemoptysis, shortness of breath and difficulty breathing.   Cardiovascular: Negative for chest pain, palpitations and swelling in legs/feet.  Gastrointestinal: Negative for abdominal pain, blood in stool, constipation and  diarrhea.  Endocrine: Negative for increased urination.  Genitourinary: Negative for painful urination.  Musculoskeletal: Positive for arthralgias, joint pain, joint swelling and morning stiffness. Negative for myalgias, muscle weakness, muscle tenderness and myalgias.  Skin: Positive for nodules/bumps. Negative for color change, rash and redness.  Allergic/Immunologic: Negative for susceptible to infections.  Neurological: Positive for weakness. Negative for dizziness, numbness, headaches and memory loss.  Hematological: Negative for swollen glands.  Psychiatric/Behavioral: Negative for confusion and sleep disturbance.    PMFS History:  Patient Active Problem List   Diagnosis Date Noted  . Need for vaccination against Streptococcus pneumoniae using pneumococcal conjugate vaccine 13 09/30/2020  . Temporal pain 05/08/2020  . Shoulder weakness 05/08/2020  . 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 Eczema 12/11/2018  . Ear congestion, right 10/12/2018  . Rheumatoid arthritis ([place]) 05/04/2015  . Anal pruritus 02/19/2015  . Cough 07/22/2014  . Panic attack 07/19/2014  . Microcytic anemia 05/02/2014  . Leucocytosis 05/02/2014  . Pruritic rash 04/24/2014  . Bilateral knee pain 03/20/2014  . Healthcare maintenance 02/25/2014  . HTN (hypertension) 02/07/2014  . Baker's cyst of knee 07/20/2013    Past Medical History:  Diagnosis Date  . H/O pinworm infection   . Hypertension 2012  . Rheumatoid arthritis ([place]) 2017    Family History  Problem Relation Age of Onset  . Heart disease Mother   . Hyperlipidemia Mother   . Hypertension Mother   . Heart disease Father   . Hypertension Father   . Diabetes Father   . Healthy Son   .  Healthy Son   . Healthy Brother   . Healthy Brother    History reviewed. No pertinent surgical history. Social History   Social History Narrative   Originally from    [place]   Has lived in [place]. Since 1999   2 young  adult sons   Husband was a professor at [employer], but imprisoned for illegal internet activity.  Patient states he was unfairly targeted.  Tried to work with Leanne Chang to have his conviction overturned.  He was deported after release in recent years.   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   Immunization History  Administered Date(s) Administered  . Influenza,inj,Quad PF,6+ Mos 02/25/2014, 02/19/2015  . PFIZER(Purple Top)SARS-COV-2 Vaccination 07/27/2019, 08/20/2019  . Pneumococcal Conjugate-13 09/30/2020     Objective: Vital Signs: BP (!) 142/95 (BP Location: Right Arm, Patient Position: Sitting, Cuff Size: Normal)   Pulse 96   Ht _0  (1.499 m)   Wt 155 lb 9.6 oz (70.6 kg)   BMI 31.43 kg/m    Physical Exam Constitutional:      Appearance: She is obese.  HENT:     Right Ear: External ear normal.     Left Ear: External ear normal.     Mouth/Throat:     Mouth: Mucous membranes are moist.     Pharynx: Oropharynx is clear.  Eyes:     Conjunctiva/sclera: Conjunctivae normal.  Cardiovascular:     Rate and Rhythm: Normal rate and regular rhythm.  Pulmonary:     Effort: Pulmonary effort is normal.     Breath sounds: Normal breath sounds.  Skin:    General: Skin is warm and dry.     Findings: No rash.  Neurological:     General: No focal deficit present.     Mental Status: She is alert.  Psychiatric:        Mood and Affect: Mood normal.     Musculoskeletal Exam:  Shoulders decreased abduction b/l slightly decreased external rotation on left, left side tender to palpation Elbows decreased extension and swelling present, left worse than right Wrists reduced ROM worse in extension than flexion b/l, no swelling Fingers full ROM right 1st CMC and MCP swelling PIP hyperextensibility of multiple digits b/l Knees swelling present bilaterally right side baker's cyst mild tenderness fair ROM Ankles full ROM no tenderness or swelling MTPs full ROM no tenderness or  swelling   CDAI Exam: CDAI Score: 21  Patient Global: 40 mm; Provider Global: 60 mm Swollen: 3 ; Tender: 8  Joint Exam 09/30/2020      Right  Left  Glenohumeral   Tender   Tender  Elbow      Tender  Wrist   Tender   Tender  MCP 1  Swollen Tender     Knee  Swollen Tender  Swollen Tender     Investigation: No additional findings.  Imaging: XR Hand 2 View Left  Result Date: 10/01/2020 X-ray left hand 2 views Radiocarpal joint space narrowing with erosive changes.  Carpal bones with numerous cystic and erosive changes with loss of joint space.  MCP joint spaces appear normal.  PIP joints appear normal DIP joints with some early probable lateral osteophytes or enthesophytes. Impression Numerous cystic and erosive changes of carpal bones with minimal change in distal hand joint consistent with an inflammatory arthritis or consideration for crystalline arthropathy  XR Hand 2 View Right  Result Date: 10/01/2020 X-ray right hand 2 views Radiocarpal joint space  narrowing with sclerotic and erosive change.  Narrowing and erosions of carpal bones also with numerous cystic changes.  MCP joint spaces appear normal.  PIP and DIP joint spaces appear intact. Impression Extensive cystic and erosive changes in wrist joints with fairly preserved distal joint spaces and bone mineralization consistent with inflammatory arthritis also consideration for crystalline arthropathy  XR Shoulder Left  Result Date: 10/01/2020 X-ray left shoulder 2 views Shoulder is held in slightly high position.  AC joint appears normal.  Normal-appearing internal and external rotation of glenohumeral joint.  No obvious large joint effusions or calcifications as seen.  Bone mineralization appears normal. Impression No significant inflammatory or erosive changes are seen  XR Shoulder Right  Result Date: 10/01/2020 X-ray right shoulder 4 views Shoulders held in neutral position.  Normal degree of glenohumeral joint internal and  external rotation.  AC joint appears normal.  No significant joint effusion or calcifications are visible.  Bone mineralization appears normal. Impression No significant inflammatory or erosive changes are seen   Recent Labs: Lab Results  Component Value Date   WBC 8.8 09/30/2020   HGB 11.0 (L) 09/30/2020   PLT 441 09/30/2020   NA 140 09/30/2020   K 4.2 09/30/2020   CL 101 09/30/2020   CO2 25 09/30/2020   GLUCOSE 121 (H) 09/30/2020   BUN 12 09/30/2020   CREATININE 0.49 (L) 09/30/2020   BILITOT 0.2 09/30/2020   ALKPHOS 92 09/30/2020   AST 17 09/30/2020   ALT 13 09/30/2020   PROT 8.5 09/30/2020   ALBUMIN 3.5 (L) 09/30/2020   CALCIUM 9.6 09/30/2020   GFRAA 127 05/07/2020   QFTBGOLD Negative 11/11/2015   QFTBGOLDPLUS Negative 09/30/2020    Speciality Comments: No specialty comments available.  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 She has not really been treated due to very short term use of methotrexate on account of intolerance previously. I suspect her shoulder pain and inflammation does reflect some disease activity although her work could also be exacerbating this. Checking xrays of bilateral hands and shoulders today for evidence of erosive disease. Will obtain baseline labs for medication monitoring with CBC, CMP, TB. Checking ESR for disease activity monitoring. Checking vitamin D suspicions for deficiency and risk of osteoporosis increased with RA. I believe she would benefit from treatment with bDMARD will recommend Enbrel for good data as monotherapy.  Temporal pain  She reports symptoms may be more  radiating from the neck and shoulder, not provoked with chewing, symptoms did seem improved on steroids but has remained largely improved now off any medication. This sounds atypical for GCA presentation but can reconsider if not having typical response to treatment or worsening.  Hypergammaglobulinemia  The high inflammatory markers and hypergammaglobulinemia suggest RA disease activity as well.   Need for vaccination against Streptococcus pneumoniae using pneumococcal conjugate vaccine 13  Will recommend her for vaccination due to higher pneumonia risk with starting chronic biologic DMARD treatment.  Orders: Orders Placed This Encounter  Procedures  . XR Hand 2 View Right  . XR Hand 2 View Left  . XR Shoulder Left  . XR Shoulder Right   No  orders of the defined types were placed in this encounter.   Follow-Up Instructions: No follow-ups on file.   Collier Salina, MD  Note - This record has been created using [employer].  Chart creation errors have been sought, but may not always  have been located. Such creation errors do not reflect on  the standard of medical care.

## 2020-09-30 ENCOUNTER — Ambulatory Visit: Payer: Self-pay

## 2020-09-30 ENCOUNTER — Encounter: Payer: Self-pay | Admitting: Internal Medicine

## 2020-09-30 ENCOUNTER — Ambulatory Visit (INDEPENDENT_AMBULATORY_CARE_PROVIDER_SITE_OTHER): Payer: Self-pay | Admitting: Internal Medicine

## 2020-09-30 ENCOUNTER — Other Ambulatory Visit: Payer: Self-pay

## 2020-09-30 ENCOUNTER — Ambulatory Visit (INDEPENDENT_AMBULATORY_CARE_PROVIDER_SITE_OTHER): Payer: Self-pay | Admitting: *Deleted

## 2020-09-30 ENCOUNTER — Telehealth: Payer: Self-pay | Admitting: Pharmacist

## 2020-09-30 ENCOUNTER — Other Ambulatory Visit (HOSPITAL_COMMUNITY): Payer: Self-pay

## 2020-09-30 VITALS — BP 142/95 | HR 96 | Ht 59.0 in | Wt 155.6 lb

## 2020-09-30 DIAGNOSIS — M05761 Rheumatoid arthritis with rheumatoid factor of right knee without organ or systems involvement: Secondary | ICD-10-CM

## 2020-09-30 DIAGNOSIS — M05762 Rheumatoid arthritis with rheumatoid factor of left knee without organ or systems involvement: Secondary | ICD-10-CM

## 2020-09-30 DIAGNOSIS — M79642 Pain in left hand: Secondary | ICD-10-CM

## 2020-09-30 DIAGNOSIS — Z0000 Encounter for general adult medical examination without abnormal findings: Secondary | ICD-10-CM

## 2020-09-30 DIAGNOSIS — Z23 Encounter for immunization: Secondary | ICD-10-CM | POA: Insufficient documentation

## 2020-09-30 DIAGNOSIS — R519 Headache, unspecified: Secondary | ICD-10-CM

## 2020-09-30 DIAGNOSIS — M79641 Pain in right hand: Secondary | ICD-10-CM

## 2020-09-30 DIAGNOSIS — M25512 Pain in left shoulder: Secondary | ICD-10-CM

## 2020-09-30 DIAGNOSIS — M25511 Pain in right shoulder: Secondary | ICD-10-CM

## 2020-09-30 DIAGNOSIS — D892 Hypergammaglobulinemia, unspecified: Secondary | ICD-10-CM

## 2020-09-30 NOTE — Patient Instructions (Addendum)
Rheumatoid Arthritis Rheumatoid arthritis (RA) is a long-term (chronic) disease that causes inflammation in your joints. RA may start slowly. It most often affects the small joints of the hands and feet. Usually, the same joints are affected on both sides of your body. Inflammation from RA can also affect other parts of your body, including your heart, eyes, or lungs. There is no cure for RA, but medicines can help your symptoms and halt or slow down the progression of the disease. What are the causes? RA is an autoimmune disease. When you have an autoimmune disease, your body's defense system (immune system) mistakenly attacks healthy body tissues. The exact cause of RA is not known. What increases the risk? You are more likely to develop this condition if you:  Are a woman.  Have a family history of RA or other autoimmune diseases.  Have a history of smoking.  Are obese.  Have been exposed to pollutants or chemicals. What are the signs or symptoms? The first symptom of this condition may be morning stiffness that lasts longer than 30 minutes.  Symptoms usually start gradually. They are often worse in the morning. As RA progresses, symptoms may include:  Pain, stiffness, swelling, warmth, and tenderness in joints on both sides of your body.  Loss of energy.  Loss of appetite.  Weight loss.  Low-grade fever.  Dry eyes and dry mouth.  Firm lumps (rheumatoid nodules) that grow beneath your skin in areas such as your forearm bones near your elbows and on your hands.  Changes in the appearance of joints (deformity) and loss of joint function. Symptoms of this condition vary from person to person.  Symptoms of RA often come and go.  Sometimes, symptoms get worse for a period of time. These are called flares. How is this diagnosed? This condition is diagnosed based on your symptoms, medical history, and physical exam.  You may have X-rays or an MRI to check for the type of  joint changes that are caused by RA. You may also have blood tests to look for:  Proteins (antibodies) that your immune system may make if you have RA. These include rheumatoid factor (RF) and anti-CCP. ? When blood tests show these proteins, you are said to have "seropositive RA." ? When blood tests do not show these proteins, you may have "seronegative RA."  Inflammation in your blood.  A low number of red blood cells (anemia). How is this treated? The goals of treatment are to relieve pain, reduce inflammation, and slow down or stop joint damage and disability. Treatment may include:  Lifestyle changes. It is important to rest as needed, eat a healthy diet, and exercise.  Medicines. Your health care provider may adjust your medicines every 3 months until treatment goals are reached. Common medicines include: ? Pain relievers (analgesics). ? Corticosteroids and NSAIDs to reduce inflammation. ? Disease-modifying antirheumatic drugs (DMARDs) to try to slow the course of the disease. ? Biologic response modifiers to reduce inflammation and damage.  Physical therapy and occupational therapy.  Surgery, if you have severe joint damage. Joint replacement or fusing of joints may be needed. Your health care provider will work with you to identify the best treatment option for you based on assessment of the overall disease activity in your body.   Follow these instructions at home: Activity  Return to your normal activities as told by your health care provider. Ask your health care provider what activities are safe for you.  Rest when you are  having a flare.  Start an exercise program as told by your health care provider. General instructions  Keep all follow-up visits as told by your health care provider. This is important.  Take over-the-counter and prescription medicines only as told by your health care provider. Where to find more information  [employer]:  www.rheumatology.org  Arthritis Foundation: www.arthritis.org Contact a health care provider if:  You have a flare-up of RA symptoms.  You have a fever.  You have side effects from your medicines. Get help right away if:  You have chest pain.  You have trouble breathing.  You quickly develop a hot, painful joint that is more severe than your usual joint aches. Summary  Rheumatoid arthritis (RA) is a long-term (chronic) disease that causes inflammation in your joints.  RA is an autoimmune disease.  The goals of treatment are to relieve pain, reduce inflammation, and slow down or stop joint damage and disability.   Etanercept Injection What is this medicine? ETANERCEPT (et a [employer]) is used for the treatment of rheumatoid arthritis. The medicine is also used to treat psoriatic arthritis, ankylosing spondylitis, and psoriasis. This medicine may be used for other purposes; ask your health care provider or pharmacist if you have questions. COMMON BRAND NAME(S): Enbrel What should I tell my health care provider before I take this medicine? They need to know if you have any of these conditions:  bleeding disorder  cancer  diabetes  granulomatosis with polyangiitis  heart failure  HIV or AIDs  immune system problems  infection such as tuberculosis (TB) or other bacterial, fungal or viral infections  liver disease  nervous system problems such as Guillain-Barre syndrome, multiple sclerosis or seizures  recent or upcoming vaccine  an unusual or allergic reaction to etanercept, other medicines, latex, rubber, food, dyes, or preservatives  pregnant or trying to get pregnant  breast-feeding How should I use this medicine? The medicine is injected under the skin. You will be taught how to prepare and give it. Take it as directed on the prescription label. Keep taking it unless your health care provider tells you stop. This medicine comes with INSTRUCTIONS FOR USE.  Ask your pharmacist for directions on how to use this medicine. Read the information carefully. Talk to your pharmacist or health care provider if you have questions. If you use a pen, be sure to take off the outer needle cover before using the dose. It is important that you put your used needles and syringes in a special sharps container. Do not put them in a trash can. If you do not have a sharps container, call your pharmacist or health care provider to get one. A special MedGuide will be given to you by the pharmacist with each prescription and refill. Be sure to read this information carefully each time. Talk to your health care provider about the use of this medicine in children. While it may be prescribed for children as young as 43 years of age for selected conditions, precautions do apply. Overdosage: If you think you have taken too much of this medicine contact a poison control center or emergency room at once. NOTE: This medicine is only for you. Do not share this medicine with others. What if I miss a dose? If you miss a dose, take it as soon as you can. If it is almost time for your next dose, take only that dose. Do not take double or extra doses. What may interact with this medicine?  Do not take this medicine with any of the following medications:  biologic medicines such as adalimumab, certolizumab, golimumab, infliximab  live vaccines  rilonacept This medicine may also interact with the following medications:  abatacept  anakinra  biologic medicines such as anifrolumab, baricitinib, belimumab, canakinumab, natalizumab, rituximab, sarilumab, tocilizumab, tofacitinib, upadacitinib, vedolizumab  cyclophosphamide  sulfasalazine This list may not describe all possible interactions. Give your health care provider a list of all the medicines, herbs, non-prescription drugs, or dietary supplements you use. Also tell them if you smoke, drink alcohol, or use illegal drugs. Some items  may interact with your medicine. What should I watch for while using this medicine? Visit your health care provider for regular checks on your progress. Tell your health care provider if your symptoms do not start to get better or if they get worse. This medicine may increase your risk of getting an infection. Call your health care provider for advice if you get a fever, chills, sore throat, or other symptoms of a cold or flu. Do not treat yourself. Try to avoid being around people who are sick. If you have not had the measles or chickenpox vaccines, tell your health care provider right away if you are around someone with these viruses. You will be tested for tuberculosis (TB) before you start this medicine. If your doctor prescribes any medicine for TB, you should start taking the TB medicine before starting this medicine. Make sure to finish the full course of TB medicine. Avoid taking medicines that contain aspirin, acetaminophen, ibuprofen, naproxen, or ketoprofen unless instructed by your health care provider. These medicines may hide fever. Talk to your health care provider about your risk of cancer. You may be more at risk for certain types of cancer if you take this medicine. This medicine can decrease the response to a vaccine. If you need to get vaccinated, tell your health care provider if you have received this medicine. Extra booster doses may be needed. Talk to your health care provider to see if a different vaccination schedule is needed. What side effects may I notice from receiving this medicine? Side effects that you should report to your health care provider as soon as possible:  allergic reactions (skin rash, itching or hives; swelling of the face, lips, or tongue)  changes in vision  dizziness  heart failure (trouble breathing; fast, irregular heartbeat; sudden weight gain; swelling of the ankles, feet, hands; unusually weak or tired)  infection (fever, chills, cough, sore  throat, pain or trouble passing urine)  light-colored stools  liver injury (dark yellow or brown urine; general ill feeling or flu-like symptoms; loss of appetite, right upper belly pain; unusually weak or tired, yellowing of the eyes or skin)  low red blood cell counts (trouble breathing; feeling faint; lightheaded, falls; unusually weak or tired)  pain, tingling, numbness in the hands or feet  red, scaly patches or raised bumps on the skin  seizures  unusual bruising or bleeding  weakness in the arms or legs Side effects that usually do not require medical attention (report to your health care provider if they continue or are bothersome):  headache  nausea  pain, redness, or irritation at site where injected  vomiting This list may not describe all possible side effects. Call your doctor for medical advice about side effects. You may report side effects to FDA at 1-800-FDA-1088. Where should I keep my medicine? Keep out of the reach of children and pets. See product for storage information. Each  product may have different instructions. Get rid of any unused medicine after the expiration date. To get rid of medicines that are no longer needed or have expired:  Take the medicine to a medicine take-back program. Check with your pharmacy or law enforcement to find a location.  If you cannot return the medicine, ask your pharmacist or health care provider how to get rid of this medicine safely.  Prednisone tablets What is this medicine? PREDNISONE (PRED ni sone) is a corticosteroid. It is commonly used to treat inflammation of the skin, joints, lungs, and other organs. Common conditions treated include asthma, allergies, and arthritis. It is also used for other conditions, such as blood disorders and diseases of the adrenal glands. This medicine may be used for other purposes; ask your health care provider or pharmacist if you have questions. COMMON BRAND NAME(S): Deltasone,  Predone, Sterapred, Sterapred DS What should I tell my health care provider before I take this medicine? They need to know if you have any of these conditions:  Cushing's syndrome  diabetes  glaucoma  heart disease  high blood pressure  infection (especially a virus infection such as chickenpox, cold sores, or herpes)  kidney disease  liver disease  mental illness  myasthenia gravis  osteoporosis  seizures  stomach or intestine problems  thyroid disease  an unusual or allergic reaction to lactose, prednisone, other medicines, foods, dyes, or preservatives  pregnant or trying to get pregnant  breast-feeding How should I use this medicine? Take this medicine by mouth with a glass of water. Follow the directions on the prescription label. Take this medicine with food. If you are taking this medicine once a day, take it in the morning. Do not take more medicine than you are told to take. Do not suddenly stop taking your medicine because you may develop a severe reaction. Your doctor will tell you how much medicine to take. If your doctor wants you to stop the medicine, the dose may be slowly lowered over time to avoid any side effects. Talk to your pediatrician regarding the use of this medicine in children. Special care may be needed. Overdosage: If you think you have taken too much of this medicine contact a poison control center or emergency room at once. NOTE: This medicine is only for you. Do not share this medicine with others. What if I miss a dose? If you miss a dose, take it as soon as you can. If it is almost time for your next dose, talk to your doctor or health care professional. You may need to miss a dose or take an extra dose. Do not take double or extra doses without advice. What may interact with this medicine? Do not take this medicine with any of the following medications:  metyrapone  mifepristone This medicine may also interact with the following  medications:  aminoglutethimide  amphotericin B  aspirin and aspirin-like medicines  barbiturates  certain medicines for diabetes, like glipizide or glyburide  cholestyramine  cholinesterase inhibitors  cyclosporine  digoxin  diuretics  ephedrine  female hormones, like estrogens and birth control pills  isoniazid  ketoconazole  NSAIDS, medicines for pain and inflammation, like ibuprofen or naproxen  phenytoin  rifampin  toxoids  vaccines  warfarin This list may not describe all possible interactions. Give your health care provider a list of all the medicines, herbs, non-prescription drugs, or dietary supplements you use. Also tell them if you smoke, drink alcohol, or use illegal drugs. Some items may  interact with your medicine. What should I watch for while using this medicine? Visit your doctor or health care professional for regular checks on your progress. If you are taking this medicine over a prolonged period, carry an identification card with your name and address, the type and dose of your medicine, and your doctor's name and address. This medicine may increase your risk of getting an infection. Tell your doctor or health care professional if you are around anyone with measles or chickenpox, or if you develop sores or blisters that do not heal properly. If you are going to have surgery, tell your doctor or health care professional that you have taken this medicine within the last twelve months. Ask your doctor or health care professional about your diet. You may need to lower the amount of salt you eat. This medicine may increase blood sugar. Ask your healthcare provider if changes in diet or medicines are needed if you have diabetes. What side effects may I notice from receiving this medicine? Side effects that you should report to your doctor or health care professional as soon as possible:  allergic reactions like skin rash, itching or hives, swelling of  the face, lips, or tongue  changes in emotions or moods  changes in vision  depressed mood  eye pain  fever or chills, cough, sore throat, pain or difficulty passing urine  signs and symptoms of high blood sugar such as being more thirsty or hungry or having to urinate more than normal. You may also feel very tired or have blurry vision.  swelling of ankles, feet Side effects that usually do not require medical attention (report to your doctor or health care professional if they continue or are bothersome):  confusion, excitement, restlessness  headache  nausea, vomiting  skin problems, acne, thin and shiny skin  trouble sleeping  weight gain This list may not describe all possible side effects. Call your doctor for medical advice about side effects. You may report side effects to FDA at 1-800-FDA-1088. Where should I keep my medicine? Keep out of the reach of children. Store at room temperature between 15 and 30 degrees C (59 and 86 degrees F). Protect from light. Keep container tightly closed. Throw away any unused medicine after the expiration date.

## 2020-09-30 NOTE — Progress Notes (Signed)
Verbal given per Dr Antony Contras, [facility], ok to given prevnar 13 vaccine.

## 2020-09-30 NOTE — Progress Notes (Signed)
Pharmacy Note  Subjective: Patient presents today to the [facility] for follow up office visit.  Patient seen by the pharmacist for counseling on Enbrel for rheumatoid arthritis.   Diagnosis of heart failure: No  Objective:  CBC    Component Value Date/Time   WBC 16.7 (H) 02/20/2020 1446   RBC 4.96 02/20/2020 1446   HGB 12.5 02/20/2020 1446   HGB 11.3 11/23/2019 1625   HCT 39.3 02/20/2020 1446   HCT 35.4 11/23/2019 1625   PLT 377 02/20/2020 1446   PLT 375 11/23/2019 1625   MCV 79.2 (L) 02/20/2020 1446   MCV 79 11/23/2019 1625   MCH 25.2 (L) 02/20/2020 1446   MCHC 31.8 02/20/2020 1446   RDW 15.5 02/20/2020 1446   RDW 14.1 11/23/2019 1625   LYMPHSABS 2.0 12/11/2018 1558   MONOABS 0.7 05/01/2014 1213   EOSABS 0.2 12/11/2018 1558   BASOSABS 0.1 12/11/2018 1558     CMP     Component Value Date/Time   NA 140 05/07/2020 1641   K 3.8 05/07/2020 1641   CL 102 05/07/2020 1641   CO2 20 05/07/2020 1641   GLUCOSE 88 05/07/2020 1641   GLUCOSE 153 (H) 02/20/2020 1446   BUN 7 05/07/2020 1641   CREATININE 0.47 (L) 05/07/2020 1641   CREATININE 0.50 05/01/2014 1213   CALCIUM 9.2 05/07/2020 1641   PROT 7.7 05/07/2020 1641   ALBUMIN 3.8 05/07/2020 1641   AST 17 05/07/2020 1641   ALT 18 05/07/2020 1641   ALKPHOS 107 05/07/2020 1641   BILITOT 0.2 05/07/2020 1641   GFRNONAA 110 05/07/2020 1641   GFRNONAA >60 02/20/2020 1446   GFRNONAA >89 05/01/2014 1213   GFRAA 127 05/07/2020 1641   GFRAA >89 05/01/2014 1213     Baseline Immunosuppressant Therapy Labs TB GOLD   Hepatitis Panel Hepatitis Latest Ref Rng & Units 08/22/2015  Hep B Surface Ag Negative Negative   HIV Lab Results  Component Value Date   HIV Non Reactive 08/22/2015   Immunoglobulins Immunoglobulin Electrophoresis Latest Ref Rng & Units 12/11/2018  IgG 586 - 1,602 mg/dL 1,275(T)  IgM 26 - 700 mg/dL 174   SPEP Serum Protein Electrophoresis Latest Ref Rng & Units 05/07/2020  Total Protein 6.0 -  8.5 g/dL 7.7  Albumin 2.9 - 4.4 g/dL -   Chest x-ray: 94/49/67 - no active cardiopulmonary disease, no edema or consolidation  Assessment/Plan:  Counseled patient that Enbrel is a TNF blocking agent.  Counseled patient on purpose, proper use, and adverse effects of Enbrel.  Reviewed the most common adverse effects including infections, headache, and injection site reactions.  Discussed that there is the possibility of an increased risk of malignancy including non-melanoma skin cancer but it is not well understood if this increased risk is due to the medication or the disease state.  Advised patient to get yearly dermatology exams due to risk of skin cancer.  Counseled patient that Enbrel should be held prior to scheduled surgery.  Counseled patient to avoid live vaccines while on Enbrel.  Recommend annual influenza, PCV 15 or PCV20 or Pneumovax 23, and Shingrix as indicated.  She is indicated for pneumonia vaccine and zoster vaccine. She has received 2 COVID19 vaccines.  Reviewed the importance of regular labs while on Enbrel therapy.  Will monitor CBC and CMP 1 month after starting and then every 3 months routinely thereafter. Will monitor TB gold annually. Standing orders placed. Provided patient with medication education material and answered all questions.  Patient consented to Enbrel.  Will upload consent into the media tab.  Reviewed storage instructions for Enbrel.  Advised initial injection must be administered in office.    Baseline lab orders rx provided to patient to have drawn at [facility]. She plans to go tomorrow to have labs completed.  New start pending [employer] patient assistance application approval since she is uninsured  Dermatology referral will be placed at new start visit.   Chesley Mires, PharmD, MPH Clinical Pharmacist (Rheumatology and Pulmonology)

## 2020-09-30 NOTE — Telephone Encounter (Signed)
Please start Enbrel Mini BIV for RA (M05.79)  Dose: 50mg  SQ every 7 days  Previously tried therapies:  MTX - had mouth sores after one dose  Patient has "orange card" for medical care so must get medication through patient assistance. Application completed at office visit today.  Submitted Patient Assistance Application to [employer] for ENBREL along with provider portion, patient portion, med list, and income documents (pay stubs). Will update patient when we receive a response.  Fax# [phone] Phone# [phone]  [phone], PharmD, MPH Clinical Pharmacist (Rheumatology and Pulmonology)

## 2020-10-01 LAB — CMP14 + ANION GAP
ALT: 13 IU/L (ref 0–32)
AST: 17 IU/L (ref 0–40)
Albumin/Globulin Ratio: 0.7 — ABNORMAL LOW (ref 1.2–2.2)
Albumin: 3.5 g/dL — ABNORMAL LOW (ref 3.8–4.9)
Alkaline Phosphatase: 92 IU/L (ref 44–121)
Anion Gap: 14 mmol/L (ref 10.0–18.0)
BUN/Creatinine Ratio: 24 — ABNORMAL HIGH (ref 9–23)
BUN: 12 mg/dL (ref 6–24)
Bilirubin Total: 0.2 mg/dL (ref 0.0–1.2)
CO2: 25 mmol/L (ref 20–29)
Calcium: 9.6 mg/dL (ref 8.7–10.2)
Chloride: 101 mmol/L (ref 96–106)
Creatinine, Ser: 0.49 mg/dL — ABNORMAL LOW (ref 0.57–1.00)
Globulin, Total: 5 g/dL — ABNORMAL HIGH (ref 1.5–4.5)
Glucose: 121 mg/dL — ABNORMAL HIGH (ref 65–99)
Potassium: 4.2 mmol/L (ref 3.5–5.2)
Sodium: 140 mmol/L (ref 134–144)
Total Protein: 8.5 g/dL (ref 6.0–8.5)
eGFR: 110 mL/min/{1.73_m2} (ref >59)

## 2020-10-01 LAB — CBC
Hematocrit: 34.1 % (ref 34.0–46.6)
Hemoglobin: 11 g/dL — ABNORMAL LOW (ref 11.1–15.9)
MCH: 25.5 pg — ABNORMAL LOW (ref 26.6–33.0)
MCHC: 32.3 g/dL (ref 31.5–35.7)
MCV: 79 fL (ref 79–97)
Platelets: 441 10*3/uL (ref 150–450)
RBC: 4.32 x10E6/uL (ref 3.77–5.28)
RDW: 12.9 % (ref 11.7–15.4)
WBC: 8.8 10*3/uL (ref 3.4–10.8)

## 2020-10-01 LAB — VITAMIN D 25 HYDROXY (VIT D DEFICIENCY, FRACTURES): Vit D, 25-Hydroxy: 56.3 ng/mL (ref 30.0–100.0)

## 2020-10-01 LAB — SEDIMENTATION RATE: Sed Rate: 73 mm/hr — ABNORMAL HIGH (ref 0–40)

## 2020-10-02 ENCOUNTER — Telehealth: Payer: Self-pay

## 2020-10-02 LAB — QUANTIFERON-TB GOLD PLUS
QuantiFERON Mitogen Value: >10 IU/mL
QuantiFERON Nil Value: 0.08 IU/mL
QuantiFERON TB1 Ag Value: 0.1 IU/mL
QuantiFERON TB2 Ag Value: 0.12 IU/mL
QuantiFERON-TB Gold Plus: NEGATIVE

## 2020-10-02 NOTE — Telephone Encounter (Signed)
Patient called stating she has congestion with cough.  Patient states her chest is heavy and has difficulty clearing her throat.  Patient is worried that it is related to her autoimmune disease and requested a return call.

## 2020-10-03 NOTE — Telephone Encounter (Signed)
I called patient, patient verbalized understanding. 

## 2020-10-03 NOTE — Telephone Encounter (Signed)
Per Maralyn Sago at [employer], application is still in process. There is no estimated turn-around time that she could offer me. Will mark for f/u next week.

## 2020-10-03 NOTE — Telephone Encounter (Signed)
I agree she should ask primary care office about this- currently she is not on immunosuppressive medication from [place] and has no evidence or history of RA related lung diease.

## 2020-10-06 NOTE — Telephone Encounter (Signed)
Received a fax from  [employer] regarding an approval for ENBREL patient assistance from 10/06/20 to 10/06/21.   Phone number: [phone]  Chesley Mires, PharmD, MPH Clinical Pharmacist (Rheumatology and Pulmonology)

## 2020-10-07 NOTE — Telephone Encounter (Signed)
ATC patient to schedule Enbrel new start visit. Unable to reach - left VM requesting return call.   She's approved to receive Enbrel at no cost through [employer] pt assistance. Received pneumonia vaccine on 09/30/20 so ideally we wait 2 weeks to start immunosuppressive therapy.  She can be scheduled for Enbrel new start on or after 10/15/20 and as long as she doesn't have signs/symptoms of infection, isn't taking antibiotics for infection, and doesn't have any surgeries around that time period.  Will route to scheduling team as FYI in case she returns call  Vicki Cook, PharmD, MPH Clinical Pharmacist (Rheumatology and Pulmonology)

## 2020-10-07 NOTE — Telephone Encounter (Signed)
Patient scheduled for Enbrel new start on 10/20/20 @ 9am

## 2020-10-17 NOTE — Progress Notes (Signed)
Pharmacy Note  Subjective:    Vicki Cook presents to clinic today with her son to receive first dose of Enbrel Mini for rheumatoid arthritis. She presents today with a cough and states she believes it is due to losartan.  She has not yet made f/u with PCP. She states that this cough is not new and she does not have any other signs of infection and would like to start Enbrel.   She has brought 1 box of 4 cartridges with her which I returned to her with ice pack and advised to place back in refrigerator when she gets home.  Patient running a fever or have signs/symptoms of infection? No  Patient currently on antibiotics for the treatment of infection? No  Patient have any upcoming invasive procedures/surgeries? No  Objective: CMP     Component Value Date/Time   NA 140 09/30/2020 1602   K 4.2 09/30/2020 1602   CL 101 09/30/2020 1602   CO2 25 09/30/2020 1602   GLUCOSE 121 (H) 09/30/2020 1602   GLUCOSE 153 (H) 02/20/2020 1446   BUN 12 09/30/2020 1602   CREATININE 0.49 (L) 09/30/2020 1602   CREATININE 0.50 05/01/2014 1213   CALCIUM 9.6 09/30/2020 1602   PROT 8.5 09/30/2020 1602   ALBUMIN 3.5 (L) 09/30/2020 1602   AST 17 09/30/2020 1602   ALT 13 09/30/2020 1602   ALKPHOS 92 09/30/2020 1602   BILITOT 0.2 09/30/2020 1602   GFRNONAA 110 05/07/2020 1641   GFRNONAA >60 02/20/2020 1446   GFRNONAA >89 05/01/2014 1213   GFRAA 127 05/07/2020 1641   GFRAA >89 05/01/2014 1213    CBC    Component Value Date/Time   WBC 8.8 09/30/2020 1602   WBC 16.7 (H) 02/20/2020 1446   RBC 4.32 09/30/2020 1602   RBC 4.96 02/20/2020 1446   HGB 11.0 (L) 09/30/2020 1602   HCT 34.1 09/30/2020 1602   PLT 441 09/30/2020 1602   MCV 79 09/30/2020 1602   MCH 25.5 (L) 09/30/2020 1602   MCH 25.2 (L) 02/20/2020 1446   MCHC 32.3 09/30/2020 1602   MCHC 31.8 02/20/2020 1446   RDW 12.9 09/30/2020 1602   LYMPHSABS 2.0 12/11/2018 1558   MONOABS 0.7 05/01/2014 1213   EOSABS 0.2 12/11/2018 1558   BASOSABS 0.1  12/11/2018 1558    Baseline Immunosuppressant Therapy Labs TB GOLD Quantiferon TB Gold Latest Ref Rng & Units 09/30/2020  Quantiferon TB Gold Plus Negative Negative   Hepatitis Panel Hepatitis Latest Ref Rng & Units 08/22/2015  Hep B Surface Ag Negative Negative   HIV Lab Results  Component Value Date   HIV Non Reactive 08/22/2015   Immunoglobulins Immunoglobulin Electrophoresis Latest Ref Rng & Units 12/11/2018  IgG 586 - 1,602 mg/dL 6,962(X)  IgM 26 - 528 mg/dL 413   SPEP Serum Protein Electrophoresis Latest Ref Rng & Units 09/30/2020  Total Protein 6.0 - 8.5 g/dL 8.5  Albumin 2.9 - 4.4 g/dL -   Chest x-ray: 24/40/1027 - no cardiopulmonary disease  Assessment/Plan:   Patient has received 2 Pfizer COVID19 vaccines. Discussed receiving COVID19 booster dose today. She has received Prevnar 13 dose on 09/30/20 and will be due for Pneumovax 23 at minimum 8 weeks after this dose. She is eligible for zoster vaccine. Added immunization recommendations into her AVS.  Demonstrated proper injection technique with Enbrel Mini demo device  Patient able to demonstrate proper injection technique using the teach back method.  Patient self injected in the right lower abdomen with:  Sample Medication: Enbrel Mini  cartridge NDC: [id number] Lot: [id number] Expiration: 03/2023  Patient provided with Auto-touch injector device: NDC: [address]-03 Lot: [id number] Exp: 07/31/2021  Patient tolerated well.  Observed for 30 mins in office for adverse reaction and none noted.   Patient is to return in 1 month for labs.  Standing orders for CBC and CMP placed today.  Dermatology referral placed today.  Enbrel approved through [employer] patient assistance through 10/06/21.  Prescription sent with patient assistance application to [facility] for 12 week supply. She has received this supply and has been instructed to pull just one cartridge out of the box weekly.  Losartan is unlikely to be  causing cough as ARB. She has been advised to reach out to her PCP for f/u which she plans to do today. She has been advised in detail and multiple times to hold Enbrel if she develops signs/symptoms of infection or starts antibiotics.  All questions encouraged and answered.  Instructed patient to call with any further questions or concerns.  F/u with Dr. Dimple Casey scheduled for 12/01/20  Chesley Mires, PharmD, MPH Clinical Pharmacist (Rheumatology and Pulmonology)  10/20/2020 11:39 AM

## 2020-10-19 MED FILL — Losartan Potassium Tab 50 MG: ORAL | 30 days supply | Qty: 30 | Fill #1 | Status: AC

## 2020-10-20 ENCOUNTER — Other Ambulatory Visit (HOSPITAL_COMMUNITY): Payer: Self-pay

## 2020-10-20 ENCOUNTER — Ambulatory Visit: Payer: Self-pay | Admitting: Pharmacist

## 2020-10-20 ENCOUNTER — Other Ambulatory Visit: Payer: Self-pay

## 2020-10-20 DIAGNOSIS — M05761 Rheumatoid arthritis with rheumatoid factor of right knee without organ or systems involvement: Secondary | ICD-10-CM

## 2020-10-20 DIAGNOSIS — Z79899 Other long term (current) drug therapy: Secondary | ICD-10-CM

## 2020-10-20 MED ORDER — ENBREL MINI 50 MG/ML ~~LOC~~ SOCT: 50.0000 mg | SUBCUTANEOUS | 0 refills | Status: DC

## 2020-10-20 NOTE — Patient Instructions (Addendum)
Your next Enbrel dose is due on 6/27, 7/4, and every week thereafter  Your prescription will be shipped from [employer]). Their phone number is [phone]  Labs are due in 1 month then every 3 months.  Vaccines You are taking a medication(s) that can suppress your immune system.  The following immunizations are recommended: Flu annually Covid-19  Td/Tdap (tetanus, diphtheria, pertussis) every 10 years Pneumonia (Prevnar 15 then Pneumovax 23 at least 1 year apart.  Alternatively, can take Prevnar 20 without needing additional dose) Shingrix (after age 33): 2 doses from 4 weeks to 6 months apart  Please check with your PCP to make sure you are up to date  Remember the 5 C's: COUNTER - leave on the counter at least 30 minutes but up to overnight to bring medication to room temperature. This may help prevent stinging COLD - place something cold (like an ice gel pack or cold water bottle) on the injection site just before cleansing with alcohol. This may help reduce pain CLARITIN - use Claritin (generic name is loratadine) for the first two weeks of treatment or the day of, the day before, and the day after injecting. This will help to minimize injection site reactions CORTISONE CREAM - apply if injection site is irritated and itching CALL ME - if injection site reaction is bigger than the size of your fist, looks infected, blisters, or if you develop hives

## 2020-10-23 ENCOUNTER — Other Ambulatory Visit: Payer: Self-pay

## 2020-10-23 ENCOUNTER — Ambulatory Visit (INDEPENDENT_AMBULATORY_CARE_PROVIDER_SITE_OTHER): Payer: Self-pay | Admitting: Internal Medicine

## 2020-10-23 ENCOUNTER — Ambulatory Visit: Payer: Self-pay | Attending: Internal Medicine

## 2020-10-23 DIAGNOSIS — Z20822 Contact with and (suspected) exposure to covid-19: Secondary | ICD-10-CM | POA: Insufficient documentation

## 2020-10-23 DIAGNOSIS — R059 Cough, unspecified: Secondary | ICD-10-CM

## 2020-10-23 MED ORDER — AMOXICILLIN-POT CLAVULANATE 875-125 MG PO TABS: 1.0000 | ORAL_TABLET | Freq: Two times a day (BID) | ORAL | 0 refills | Status: AC

## 2020-10-23 NOTE — Assessment & Plan Note (Addendum)
Patient complains of achy productive cough for the past 2 months.  She states that a while back she had similar symptoms and was treated with antibiotics and steroid injections and her symptoms resolved.  She states that she never took an inhaled corticosteroid which was prescribed in the past.  She states that she does not like taking medications without diagnoses.  She states that she was never scheduled for PFT's. She endorses congestion, subjective fevers for the past 3 days. She also endorses pleuritic pain.  She states that her sputum was yellow in color and now white.  She denies shortness of breath and wheezing.  Denies any sore throat or recent sick contacts.  She is vaccinated against COVID x2.  She states in the past that she was told to stop losartan as this may be contributing to her cough however then she was restarted on this medication.  She is not sure if she is mixing this up with medication like lisinopril or other ACE inhibitor's.  She has been taking Tylenol PM for her symptoms with some relief.  She states that her cough is made it very difficult to work.  She states that she tried Protonix in the past but does not remember how long she took this medication.  Per chart review, this seems to be a persistent cough that was initially evaluated in October of last year.  At that time she was treated with loratadine and Protonix however she states that she did not take the PPI for a long period.  She states that she was treated with antibiotics and prednisone with improvement of her symptoms.  Seems that the cough went away until about 2 months ago.  Previously was a dry cough and at this time seems like it is more productive in nature.  She received her first dose of Enbrel last week.  Her fevers and congestion started about 2 or 3 days ago.  Although the time interval of starting Enbrel and her symptoms short, she is certainly at higher risk for infection.    Assessment/plan:  Differential  diagnoses includes COVID-19 versus other viral illness versus bacterial sinusitis versus asthma versus GERD.  Given patient's history of rheumatoid arthritis and recently started on etanercept she is at increased risk for infection.  Recommend she get tested for COVID-19.  If COVID-19 test is negative recommend 1 week course of antibiotics for possible bacterial sinusitis.  If symptoms do not improve we will need to consider GERD versus postnasal drip versus asthma.  She was previously referred for PFTs.  May need to follow-up on this  - Recommended COVID-19 test - If COVID-19 test is negative, recommend 1 week of Augmentin for possible bacterial sinusitis -If symptoms persist despite antibiotics will proceed with PFTs and take a stepwise approach for differential diagnosis including postnasal drip versus GERD versus asthma -Also recommended use of Flonase daily

## 2020-10-23 NOTE — Progress Notes (Signed)
  [facility] Internal [facility] Residency Telephone Encounter Continuity Care Appointment  HPI:  This telephone encounter was created for [name] on 10/23/2020 for the following purpose/cc cough for two months.   Past Medical History:  Past Medical History:  Diagnosis Date   H/O pinworm infection    Hypertension 2012   Rheumatoid arthritis (HCC) 2017     ROS:  Review of Systems  Constitutional:  Positive for fever.  HENT:  Positive for congestion. Negative for sore throat.   Respiratory:  Positive for cough and sputum production. Negative for shortness of breath and wheezing.   Gastrointestinal:  Negative for abdominal pain, nausea and vomiting.     Assessment / Plan / Recommendations:  Please see A&P under problem oriented charting for assessment of the patient's acute and chronic medical conditions.  As always, pt is advised that if symptoms worsen or new symptoms arise, they should go to an urgent care facility or to to ER for further evaluation.   Consent and Medical Decision Making:  Patient discussed with Dr. Antony Contras This is a telephone encounter between Vicki Cook and Pascale Maves N Troy Kanouse on 10/23/2020 for cough. The visit was conducted with the patient located at home and Shia Eber N Terissa Haffey at [facility]. The patient's identity was confirmed using their DOB and current address. The patient has consented to being evaluated through a telephone encounter and understands the associated risks (an examination cannot be done and the patient may need to come in for an appointment) / benefits (allows the patient to remain at home, decreasing exposure to coronavirus). I personally spent 23 minutes on medical discussion.

## 2020-10-24 LAB — SARS-COV-2, NAA 2 DAY TAT

## 2020-10-24 LAB — NOVEL CORONAVIRUS, NAA: SARS-CoV-2, NAA: DETECTED — AB

## 2020-10-28 NOTE — Progress Notes (Signed)
Internal Medicine Clinic Attending  Case discussed with Dr. Karilyn Cota  At the time of the visit.  We reviewed the resident's history and exam and pertinent patient test results.  I agree with the assessment, diagnosis, and plan of care documented in the resident's note.   Patient tested positive for COVID-19.

## 2020-11-04 ENCOUNTER — Encounter: Payer: Self-pay | Admitting: *Deleted

## 2020-11-19 ENCOUNTER — Telehealth: Payer: Self-pay | Admitting: *Deleted

## 2020-11-19 NOTE — Telephone Encounter (Signed)
Agree  Thank you

## 2020-11-19 NOTE — Telephone Encounter (Signed)
Received call from Vicki Cook at [facility] pharmacy requesting VO to replace damaged Enbrel Mini from Manufacturer, [employer]. This was written by Hospitalist. Verbal auth given. Will route to [facility] for agreement/denial.

## 2020-11-24 ENCOUNTER — Other Ambulatory Visit (HOSPITAL_COMMUNITY): Payer: Self-pay

## 2020-11-24 MED FILL — Losartan Potassium Tab 50 MG: ORAL | 30 days supply | Qty: 30 | Fill #2 | Status: AC

## 2020-11-30 NOTE — Progress Notes (Signed)
Office Visit Note  Patient: Vicki Cook             Date of Birth: August 27, 1960           MRN: [id number]             PCP: Harvie Heck, MD Referring: Harvie Heck, MD Visit Date: 12/01/2020   Subjective:  Follow-up (Patient had to hold Enbrel for 1 week due to antibiotic use but plans to take it today. )   History of Present Illness: Vicki Cook is a 60 y.o. female here for follow up for seropositive rheumatoid arthritis after starting Enbrel 50 mg subcu weekly. She started the Enbrel at new medication visit on 10/20/20 without incident at the time. She was seen for worsening cough on 10/23/20 and found to be COVID positive she reports family members also with COVID at this same time. She initially improved after the first few days, but felt lingering symptoms with mostly nonproductive cough so took oral antibiotics for this a few weeks later. She stopped the Enbrel while taking the antibiotics but plans to resume this today. She continues having pain in right thumb and bilateral knees are the worst affected areas. She is concerned about continued respiratory symptom in context of immunosuppressing medication and recent infection.  Previous HPI: 09/30/20 Vicki Cook is a 60 y.o. female here for evaluation with bilateral shoulder pain and history of rheumatoid arthritis and concern for possible PMR. She was previously seen with sports medicine with shoulder ultrasound consistent with inflammatory changes and treated with intraarticular injection. She was previously evaluated for rheumatoid arthritis and recommended to start methotrexate apparently taking the medicine less than a month. Recent labs with significant inflammatory markers and having some jaw pain so concern of GCA and started prednisone taper since January but never saw vascular surgery for biopsy due to cost issues.   Labs reviewed 05/2020 ESR 103 CRP 99 CK 47 CMP wnl   2017 CCP >250 RF 22   Review of Systems   Constitutional:  Negative for fatigue.  HENT:  Negative for mouth sores, mouth dryness and nose dryness.   Eyes:  Negative for pain, itching, visual disturbance and dryness.  Respiratory:  Positive for cough. Negative for hemoptysis, shortness of breath and difficulty breathing.   Cardiovascular:  Negative for chest pain, palpitations and swelling in legs/feet.  Gastrointestinal:  Negative for abdominal pain, blood in stool, constipation and diarrhea.  Endocrine: Negative for increased urination.  Genitourinary:  Negative for painful urination.  Musculoskeletal:  Positive for joint pain, joint pain, joint swelling and morning stiffness. Negative for myalgias, muscle weakness, muscle tenderness and myalgias.  Skin:  Positive for rash. Negative for color change and redness.  Allergic/Immunologic: Negative for susceptible to infections.  Neurological:  Negative for dizziness, numbness, headaches, memory loss and weakness.  Hematological:  Negative for swollen glands.  Psychiatric/Behavioral:  Negative for confusion and sleep disturbance.    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   Eczema 12/11/2018   Ear congestion, right 10/12/2018   Rheumatoid arthritis ([place]) 05/04/2015   Anal pruritus 02/19/2015   Cough 07/22/2014   Panic attack 07/19/2014   Microcytic anemia 05/02/2014   Leucocytosis 05/02/2014  Pruritic rash 04/24/2014   Bilateral knee pain 03/20/2014   Healthcare maintenance 02/25/2014   HTN (hypertension) 02/07/2014   Baker's cyst of knee 07/20/2013    Past Medical History:  Diagnosis Date   H/O pinworm infection    Hypertension 2012   Rheumatoid arthritis ([place]) 2017     Family History  Problem Relation Age of Onset   Heart disease Mother    Hyperlipidemia Mother    Hypertension Mother    Heart disease Father    Hypertension Father    Diabetes Father    Healthy Son    Healthy Son    Healthy Brother    Healthy Brother    History reviewed. No pertinent surgical history. Social History   Social History Narrative   Originally from    [place]   Has lived in [place]. Since 1999   2 young adult sons   Husband was a professor at [employer], but imprisoned for illegal internet activity.  Patient states he was unfairly targeted.  Tried to work with Leanne Chang to have his conviction overturned.  He was deported after release in recent years.   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   Immunization History  Administered Date(s) Administered   Influenza,inj,Quad PF,6+ Mos 02/25/2014, 02/19/2015   PFIZER(Purple Top)SARS-COV-2 Vaccination 07/27/2019, 08/20/2019   Pneumococcal Conjugate-13 09/30/2020     Objective: Vital Signs: BP 140/86 (BP Location: Right Arm, Patient Position: Sitting, Cuff Size: Normal)   Pulse 92   Ht 5' (1.524 m)   Wt 159 lb 9.6 oz (72.4 kg)   BMI 31.17 kg/m    Physical Exam Constitutional:      Appearance: She is obese.  HENT:     Mouth/Throat:     Mouth: Mucous membranes are moist.     Pharynx: Oropharynx is clear.  Cardiovascular:     Rate and Rhythm: Normal rate and regular rhythm.  Pulmonary:     Effort: Pulmonary effort is normal.     Breath sounds: Normal breath sounds.  Lymphadenopathy:     Cervical: No cervical adenopathy.  Skin:    General: Skin is warm and dry.     Findings: No rash.  Neurological:     Mental Status: She is alert.  Psychiatric:        Mood and Affect: Mood normal.     Musculoskeletal Exam:  Shoulders full ROM no tenderness or swelling Elbows full ROM no tenderness or swelling Wrists full ROM no tenderness or swelling Fingers right first CMC tenderness to pressure MCP tenderness  and swelling slightly decreased flexion range of motion, left hand joints appear normal Knees full flexion extension range of motion is slightly reduced with guarding against pain, mild tenderness and joint effusion present Ankles full ROM no tenderness or swelling   CDAI Exam: CDAI Score: -- Patient Global: --; Provider Global: -- Swollen: 3 ; Tender: 4  Joint Exam 12/01/2020      Right  Left  CMC   Tender     MCP 1  Swollen Tender     Knee  Swollen Tender  Swollen Tender     Investigation: No additional findings.  Imaging: No results found.  Recent Labs: Lab Results  Component Value Date   WBC 8.8 09/30/2020   HGB 11.0 (L) 09/30/2020   PLT 441 09/30/2020   NA 140 09/30/2020   K 4.2 09/30/2020   CL 101 09/30/2020   CO2 25 09/30/2020   GLUCOSE 121 (H)  09/30/2020   BUN 12 09/30/2020   CREATININE 0.49 (L) 09/30/2020   BILITOT 0.2 09/30/2020   ALKPHOS 92 09/30/2020   AST 17 09/30/2020   ALT 13 09/30/2020   PROT 8.5 09/30/2020   ALBUMIN 3.5 (L) 09/30/2020   CALCIUM 9.6 09/30/2020   GFRAA 127 05/07/2020   QFTBGOLD Negative 11/11/2015   QFTBGOLDPLUS Negative 09/30/2020    Speciality Comments: No specialty comments available.  Procedures:  No procedures performed Allergies: Asa [aspirin] and Lisinopril   Assessment / Plan:     Visit Diagnoses: Rheumatoid arthritis involving both knees with positive rheumatoid factor ([place]) - Plan: DG Chest 2 View  Seropositive rheumatoid arthritis there is some decrease in joint tenderness throughout much of the upper extremities though her worst affected joints in right thumb and bilateral knees remains swollen and tender today.  Too early after medication start for full clinical efficacy.  We will check sedimentation rate for initial treatment response.  Discussed plan to continue the Enbrel 50 mg subcu weekly for now if normal findings on medicine monitoring and chest x-ray.  Cough - Plan: DG Chest 2 View  Persistent coughing  mostly nonproductive normal work of breathing and normal lung sounds on exam today.  This could possibly be residual inflammation from recent viral respiratory illness although with clear lungs would also question possible upper respiratory etiology.  Recommend going for 2 view chest x-ray considering ongoing TNF inhibitor treatment.  High risk medication use - Plan: DG Chest 2 View  Follow-up visit after almost 1 month of Enbrel use we will plan to check CBC and CMP for medication monitoring.  She did have interruption but not more than 1 week should not significantly affect ability to screen for changes.  Orders: Orders Placed This Encounter  Procedures   DG Chest 2 View    No orders of the defined types were placed in this encounter.    Follow-Up Instructions: Return in about 3 months (around 03/03/2021) for RA on Enbrel f/u 63mo.   CCollier Salina MD  Note - This record has been created using [employer]  Chart creation errors have been sought, but may not always  have been located. Such creation errors do not reflect on  the standard of medical care.

## 2020-12-01 ENCOUNTER — Encounter: Payer: Self-pay | Admitting: Internal Medicine

## 2020-12-01 ENCOUNTER — Other Ambulatory Visit: Payer: Self-pay

## 2020-12-01 ENCOUNTER — Telehealth: Payer: Self-pay | Admitting: Radiology

## 2020-12-01 ENCOUNTER — Ambulatory Visit (INDEPENDENT_AMBULATORY_CARE_PROVIDER_SITE_OTHER): Payer: Self-pay | Admitting: Internal Medicine

## 2020-12-01 VITALS — BP 140/86 | HR 92 | Ht 60.0 in | Wt 159.6 lb

## 2020-12-01 DIAGNOSIS — M05762 Rheumatoid arthritis with rheumatoid factor of left knee without organ or systems involvement: Secondary | ICD-10-CM

## 2020-12-01 DIAGNOSIS — R059 Cough, unspecified: Secondary | ICD-10-CM

## 2020-12-01 DIAGNOSIS — M05761 Rheumatoid arthritis with rheumatoid factor of right knee without organ or systems involvement: Secondary | ICD-10-CM

## 2020-12-01 DIAGNOSIS — Z79899 Other long term (current) drug therapy: Secondary | ICD-10-CM

## 2020-12-01 NOTE — Telephone Encounter (Signed)
Patient was seen by Dr. Dimple Casey today and given written prescription for lab orders. Since she has [place] coverage, she can not have labs drawn by [facility] in our office. Dr. Dimple Casey ordered the following: sedimentation rate, CBC w/ diff/plat, and CMP w/ GFR.   Patient also needs chest x-ray, ordered in Epic by Dr. Dimple Casey. Spoke with [facility] office to make them aware. Patient should bring written Rx for labs to her appointment tomorrow.

## 2020-12-02 ENCOUNTER — Other Ambulatory Visit: Payer: Self-pay

## 2020-12-02 ENCOUNTER — Ambulatory Visit (HOSPITAL_COMMUNITY)
Admission: RE | Admit: 2020-12-02 | Discharge: 2020-12-02 | Disposition: A | Discharge status: Home / Self Care | Payer: Self-pay | Source: Ambulatory Visit | Attending: Internal Medicine | Admitting: Internal Medicine

## 2020-12-02 ENCOUNTER — Other Ambulatory Visit (HOSPITAL_COMMUNITY): Payer: Self-pay

## 2020-12-02 ENCOUNTER — Ambulatory Visit (INDEPENDENT_AMBULATORY_CARE_PROVIDER_SITE_OTHER): Payer: Self-pay | Admitting: Internal Medicine

## 2020-12-02 ENCOUNTER — Encounter: Payer: Self-pay | Admitting: Internal Medicine

## 2020-12-02 VITALS — BP 161/89 | HR 88 | Temp 98.0°F | Ht 60.0 in | Wt 158.8 lb

## 2020-12-02 DIAGNOSIS — I10 Essential (primary) hypertension: Secondary | ICD-10-CM

## 2020-12-02 DIAGNOSIS — M05761 Rheumatoid arthritis with rheumatoid factor of right knee without organ or systems involvement: Secondary | ICD-10-CM

## 2020-12-02 DIAGNOSIS — Z79899 Other long term (current) drug therapy: Secondary | ICD-10-CM

## 2020-12-02 DIAGNOSIS — M05762 Rheumatoid arthritis with rheumatoid factor of left knee without organ or systems involvement: Secondary | ICD-10-CM | POA: Insufficient documentation

## 2020-12-02 DIAGNOSIS — R059 Cough, unspecified: Secondary | ICD-10-CM

## 2020-12-02 MED ORDER — LOSARTAN POTASSIUM 50 MG PO TABS: 50.0000 mg | ORAL_TABLET | Freq: Every day | ORAL | 0 refills | Status: DC

## 2020-12-02 MED ORDER — PANTOPRAZOLE SODIUM 40 MG PO TBEC
40.0000 mg | DELAYED_RELEASE_TABLET | Freq: Every day | ORAL | 0 refills | Status: DC
  Filled 2020-12-02 – 2020-12-26 (×2): qty 30, 30d supply, fill #0

## 2020-12-02 NOTE — Assessment & Plan Note (Addendum)
Patient presents today to follow-up for her chronic cough.  She had a telehealth visit in June for her chronic cough however at that time was found to be COVID-positive.  She states that she has continued to have a productive cough however is a somewhat improved over the last 2 weeks.  She does state that she took Augmentin about 2 weeks ago because she was worried about a bacterial sinusitis.  She endorses history of sinusitis.  Denies any fevers, nausea, chills, vomiting.  States that typically in the mornings after drinking water she notices the cough is worse and she is producing more phlegm.  She has been unable to tolerate tea over the last few years due to tea causing her throat to have a feel dry.  She denies any reflux or heartburn.  However does note that very infrequently if she has not eaten in a long time or is eating very spicy food she does have some reflux.  Denies any difficulty swallowing liquids or solids.  She denies any postnasal drainage.  Denies any wheezing.  No history of asthma.  No current or history of smoking.  Per chart review looks like patient has been worked up for this chronic cough since 2016.  Have discussed PPIs, inhaled corticosteroids and allergy medications with the patient.  She states that she does not like taking medications for which she does not have a true diagnosis for her.  Seems like she did not take these medications previously.    Assessment/plan:  Given her history of rheumatoid arthritis currently on etanercept she is at more risk for infectious etiology.  However I do not suspect any infection at this time.  She does not have history of smoking nor on an ACE inhibitor or other medication that could induce a cough.  Differential diagnosis includes GERD versus postnasal drainage versus asthma.  Given her history of mild reflux symptoms as well as cough worsened by water in the mornings I suspect reflux more than asthma or postnasal drainage.  HEENT exam was  unremarkable for signs of postnasal drainage.  Lung exam was also cleared bilaterally no wheezing.  Discussed with patient that it is difficult to make these diagnoses without trialing treatment.  Patient expresses understanding.  We will start a PPI daily for 2 months and then reassess patient's cough.  -Start pantoprazole 40 mg daily for 2 months -Follow-up in 2 months to assess chronic cough -If cough is persistent despite treatment with PPI can consider further work-up of asthma versus postnasal drainage and other causes.  Patient was previously referred for PFTs can consider this again versus treatment with nasal sprays for postnasal drainage. -Follow-up CBC and chest x-ray ordered by rheumatology

## 2020-12-02 NOTE — Patient Instructions (Signed)
It was a pleasure meeting you in person today.  For your cough, I want you to try taking Protonix 40 mg daily for 2 months.  We will follow-up in about 2 months to assess your cough at this time.  I will call you with any abnormal blood results.  Once you have completed your chest x-ray we will call you with the results of that as well.

## 2020-12-02 NOTE — Progress Notes (Signed)
   CC: cough  HPI:  [name] is a 60 y.o. with a past medical history listed below presenting for evaluation of her chronic cough. For details of today's visit and the status of his chronic medical issues please refer to the assessment and plan.   Past Medical History:  Diagnosis Date   H/O pinworm infection    Hypertension 2012   Rheumatoid arthritis (HCC) 2017   Review of Systems:   Review of systems negative except for assessment and plan.  Physical Exam:  Vitals:   12/02/20 0950  BP: (!) 161/89  Pulse: 88  Temp: 98 F (36.7 C)  Weight: 158 lb 12.8 oz (72 kg)  Height: 5' (1.524 m)   Physical Exam General: alert, appears stated age, in no acute distress HEENT: Normocephalic, atraumatic, EOM intact, conjunctiva normal, no pharyngeal erythema or cobblestone appearance CV: Regular rate and rhythm, no murmurs rubs or gallops Pulm: Clear to auscultation bilaterally, normal work of breathing Abdomen: Soft, nondistended, bowel sounds present, no tenderness to palpation MSK: No lower extremity edema Skin: Warm and dry Neuro: Alert and oriented x3   Assessment & Plan:   See Encounters Tab for problem based charting.  Patient discussed with Dr. Mikey Bussing

## 2020-12-02 NOTE — Assessment & Plan Note (Addendum)
Vitals:   12/02/20 0950  BP: (!) 161/89   Currently on losartan 50 mg.  Reports that she is still taking this medication.  Unable to recheck blood pressure today.  Will reevaluate at her next visit.  Patient reported at her last visit that she was having difficulty picking up her medications at the [facility] outpatient pharmacy.  Unfortunately was only able to get 30 pills at a time.  She does not drive and having to depend on others for her ride to pick up her prescriptions has been difficult.  Refilled a 90-day prescription for losartan to [facility] [address].  -Continue losartan 50 mg daily

## 2020-12-03 LAB — CMP14 + ANION GAP
ALT: 12 IU/L (ref 0–32)
AST: 15 IU/L (ref 0–40)
Albumin/Globulin Ratio: 1 — ABNORMAL LOW (ref 1.2–2.2)
Albumin: 3.9 g/dL (ref 3.8–4.9)
Alkaline Phosphatase: 107 IU/L (ref 44–121)
Anion Gap: 14 mmol/L (ref 10.0–18.0)
BUN/Creatinine Ratio: 21 (ref 9–23)
BUN: 9 mg/dL (ref 6–24)
Bilirubin Total: <0.2 mg/dL (ref 0.0–1.2)
CO2: 23 mmol/L (ref 20–29)
Calcium: 9.1 mg/dL (ref 8.7–10.2)
Chloride: 104 mmol/L (ref 96–106)
Creatinine, Ser: 0.42 mg/dL — ABNORMAL LOW (ref 0.57–1.00)
Globulin, Total: 4 g/dL (ref 1.5–4.5)
Glucose: 92 mg/dL (ref 65–99)
Potassium: 3.7 mmol/L (ref 3.5–5.2)
Sodium: 141 mmol/L (ref 134–144)
Total Protein: 7.9 g/dL (ref 6.0–8.5)
eGFR: 113 mL/min/{1.73_m2} (ref >59)

## 2020-12-03 LAB — SEDIMENTATION RATE: Sed Rate: 108 mm/hr — ABNORMAL HIGH (ref 0–40)

## 2020-12-03 LAB — CBC WITH DIFFERENTIAL/PLATELET
Basophils Absolute: 0.1 10*3/uL (ref 0.0–0.2)
Basos: 1 %
EOS (ABSOLUTE): 0.2 10*3/uL (ref 0.0–0.4)
Eos: 2 %
Hematocrit: 35.8 % (ref 34.0–46.6)
Hemoglobin: 11.4 g/dL (ref 11.1–15.9)
Immature Grans (Abs): 0 10*3/uL (ref 0.0–0.1)
Immature Granulocytes: 0 %
Lymphocytes Absolute: 2.2 10*3/uL (ref 0.7–3.1)
Lymphs: 26 %
MCH: 25.1 pg — ABNORMAL LOW (ref 26.6–33.0)
MCHC: 31.8 g/dL (ref 31.5–35.7)
MCV: 79 fL (ref 79–97)
Monocytes Absolute: 0.6 10*3/uL (ref 0.1–0.9)
Monocytes: 7 %
Neutrophils Absolute: 5.6 10*3/uL (ref 1.4–7.0)
Neutrophils: 64 %
Platelets: 453 10*3/uL — ABNORMAL HIGH (ref 150–450)
RBC: 4.55 x10E6/uL (ref 3.77–5.28)
RDW: 17 % — ABNORMAL HIGH (ref 11.7–15.4)
WBC: 8.7 10*3/uL (ref 3.4–10.8)

## 2020-12-03 NOTE — Progress Notes (Signed)
Internal Medicine Clinic Attending ° °Case discussed with Dr. Rehman  At the time of the visit.  We reviewed the resident’s history and exam and pertinent patient test results.  I agree with the assessment, diagnosis, and plan of care documented in the resident’s note.  ° °

## 2020-12-03 NOTE — Progress Notes (Signed)
Labs look okay for continuing Enbrel. The sedimentation rate is very high probably from active arthritis. Chest xray looks clear with no evidence for pneumonia. She does not need to follow up on 8/15 there is currently an appointment scheduled there but too soon for Enbrel effectiveness, she already has one further out.

## 2020-12-04 ENCOUNTER — Telehealth: Payer: Self-pay | Admitting: Internal Medicine

## 2020-12-04 NOTE — Telephone Encounter (Signed)
I am not aware of any effect for Enbrel to cause tooth pain or damage and also reviewed the drug information to confirm this. If this is severe, she would benefit to see a dentist for evaluation. We would recommend interrupting Enbrel for one week before any dental surgery or major procedure if she needed something done.

## 2020-12-04 NOTE — Telephone Encounter (Signed)
Patient calling in reference to possible side effect to medication she is on. Patient started feeling like she is having issues with her teeth / decaying that started yesterday. Patient states she was asked about this before she started medication, but did not have any issues then. Please call to advise.

## 2020-12-04 NOTE — Telephone Encounter (Signed)
Spoke with patient- last night she noticed a sharp pain in her front teeth and feels as if her tooth is cracked.   Patient is still taking Enbrel. Patient would like to know if this could be related to medication or unrelated and how she should proceed.

## 2020-12-04 NOTE — Telephone Encounter (Signed)
Spoke with patient, advised Dr. Dimple Casey is not aware of any effect for Enbrel to cause tooth pain or damage and also reviewed the drug information to confirm this. If this is severe, she would benefit to see a dentist for evaluation. We would recommend interrupting Enbrel for one week before any dental surgery or major procedure if she needed something done.

## 2020-12-09 ENCOUNTER — Telehealth: Payer: Self-pay | Admitting: Dermatology

## 2020-12-09 NOTE — Telephone Encounter (Signed)
Patient is calling for a referral appointment from Sheliah Hatch, MD.  Patient is scheduled for 06/16/2021 at 10:30 with Janalyn Harder, M.D.

## 2020-12-09 NOTE — Telephone Encounter (Signed)
Notes documented and referral attached to appointment.

## 2020-12-10 ENCOUNTER — Other Ambulatory Visit (HOSPITAL_COMMUNITY): Payer: Self-pay

## 2020-12-15 ENCOUNTER — Ambulatory Visit: Payer: Self-pay | Admitting: Internal Medicine

## 2020-12-26 ENCOUNTER — Other Ambulatory Visit (HOSPITAL_COMMUNITY): Payer: Self-pay

## 2020-12-26 ENCOUNTER — Other Ambulatory Visit: Payer: Self-pay | Admitting: Student

## 2020-12-26 DIAGNOSIS — I10 Essential (primary) hypertension: Secondary | ICD-10-CM

## 2020-12-29 ENCOUNTER — Other Ambulatory Visit: Payer: Self-pay | Admitting: Student

## 2020-12-29 ENCOUNTER — Other Ambulatory Visit (HOSPITAL_COMMUNITY): Payer: Self-pay

## 2020-12-29 DIAGNOSIS — I10 Essential (primary) hypertension: Secondary | ICD-10-CM

## 2020-12-29 MED ORDER — LOSARTAN POTASSIUM 50 MG PO TABS
50.0000 mg | ORAL_TABLET | Freq: Every day | ORAL | 0 refills | Status: DC
  Filled 2020-12-29: qty 30, 30d supply, fill #0

## 2020-12-29 NOTE — Telephone Encounter (Signed)
losartan (COZAAR) 50 MG tablet, refill request @ [facility]. 

## 2021-01-02 ENCOUNTER — Telehealth: Payer: Self-pay | Admitting: Student

## 2021-01-02 DIAGNOSIS — I10 Essential (primary) hypertension: Secondary | ICD-10-CM

## 2021-01-02 MED ORDER — LOSARTAN POTASSIUM 50 MG PO TABS
50.0000 mg | ORAL_TABLET | Freq: Every day | ORAL | 0 refills | Status: DC
  Filled 2021-02-03: qty 30, 30d supply, fill #0
  Filled 2021-03-05: qty 30, 30d supply, fill #1

## 2021-01-02 NOTE — Telephone Encounter (Signed)
Patient contacted after-hours number.  She states that she was unable to pick up her losartan today.  Sent her prescription to the [facility] [place] that is open until 10 PM.

## 2021-01-06 ENCOUNTER — Other Ambulatory Visit (HOSPITAL_COMMUNITY): Payer: Self-pay

## 2021-01-26 ENCOUNTER — Other Ambulatory Visit: Payer: Self-pay

## 2021-01-26 DIAGNOSIS — M05762 Rheumatoid arthritis with rheumatoid factor of left knee without organ or systems involvement: Secondary | ICD-10-CM

## 2021-01-26 DIAGNOSIS — M05761 Rheumatoid arthritis with rheumatoid factor of right knee without organ or systems involvement: Secondary | ICD-10-CM

## 2021-01-26 MED ORDER — ENBREL MINI 50 MG/ML ~~LOC~~ SOCT: 50.0000 mg | SUBCUTANEOUS | 0 refills | Status: DC

## 2021-01-26 NOTE — Telephone Encounter (Signed)
Patient called stating she is due to take her Enbrel injection today, but is out of medication.  Patient states the pharmacy requested a refill, but hasn't received a response from Dr. Dimple Casey.  Patient requested a return call.

## 2021-01-26 NOTE — Addendum Note (Signed)
Addended by: Henriette Combs on: 01/26/2021 12:49 PM   Modules accepted: Orders

## 2021-01-26 NOTE — Telephone Encounter (Signed)
Agree with both if she needs it. Does this Rx need a specific pharmacy-currently lists as fax/none?

## 2021-01-26 NOTE — Telephone Encounter (Signed)
Prescription sent to the patient assistance pharmacy. Patient advised. Patient advised she may come by the office to pick up a sample on Enbrel per Dr. Dimple Casey. Patient states she will have her son bring her by to get the Enbrel. Patient advised for future refills call us when she is using the second to last cartridge so we can send to the pharmacy and avoid her not having her prescription in time. Patient expressed understanding.

## 2021-01-26 NOTE — Telephone Encounter (Signed)
Next Visit: 03/03/2021  Last Visit: 12/01/2020  Last Fill: 10/20/2020  HK:GOVPCHEKBT arthritis involving both knees with positive rheumatoid factor   Current Dose per office note 12/01/2020: Enbrel 50 mg subcu weekly  Labs: 12/02/2020 Labs look okay for continuing Enbrel  TB Gold: 09/30/2020 Neg    Okay to refill Enbrel? Can we also provide patient with a sample so she does not miss her dose?

## 2021-01-30 ENCOUNTER — Telehealth: Payer: Self-pay

## 2021-01-30 NOTE — Telephone Encounter (Signed)
Called patient and states that pharmacy keeps giving her mixed answers on shipment date.  Called [employer] and states that she is getting replacement of 2 cartridges on Tuesday, 02/03/21. Once this shipment is received, she can call [employer] back to schedule the next shipment of 3 month supply. Called patient to relay this information and she verbalized understanding  Phone: [phone]  Chesley Mires, PharmD, MPH, BCPS Clinical Pharmacist (Rheumatology and Pulmonology)

## 2021-01-30 NOTE — Telephone Encounter (Signed)
Patient left a voicemail stating she is having a difficult time ordering her medication.  Patient requested a return call.

## 2021-02-03 ENCOUNTER — Telehealth: Payer: Self-pay

## 2021-02-03 ENCOUNTER — Other Ambulatory Visit (HOSPITAL_COMMUNITY): Payer: Self-pay

## 2021-02-03 NOTE — Telephone Encounter (Signed)
losartan (COZAAR) 50 MG tablet, refill request @ [facility].

## 2021-02-03 NOTE — Telephone Encounter (Signed)
Called pt - stated she prefers losartan rx be filled at [facility] instead of [facility]. I asked pt to call the pharmacy to have rx transferred and if she has any problems to call me back - stated she will call.

## 2021-02-04 ENCOUNTER — Other Ambulatory Visit (HOSPITAL_COMMUNITY): Payer: Self-pay

## 2021-03-03 ENCOUNTER — Ambulatory Visit: Payer: Self-pay | Admitting: Internal Medicine

## 2021-03-05 ENCOUNTER — Other Ambulatory Visit (HOSPITAL_COMMUNITY): Payer: Self-pay

## 2021-03-22 NOTE — Progress Notes (Signed)
 Office Visit Note  Patient: Vicki Cook             Date of Birth: 02/06/1961           MRN: [id number]             PCP: Aslam, Sadia, MD Referring: Aslam, Sadia, MD Visit Date: 03/23/2021   Subjective:  Rheumatoid Arthritis (Bil knee swelling)   History of Present Illness: Vicki Cook is a 60 y.o. female here for follow up for seropositive RA on Enbrel 50 mg [place] weekly. She feels that symptoms are partially improved on the Enbrel. Hands and wrists are significantly improved. She continues having bilateral knee swelling especially on right side. She takes occasional tylenol for symptoms. She has gained at least 5 pounds over the past few months despite trying to stay active.  Previous HPI 12/01/20 Vicki Cook is a 60 y.o. female here for follow up for seropositive rheumatoid arthritis after starting Enbrel 50 mg subcu weekly. She started the Enbrel at new medication visit on 10/20/20 without incident at the time. She was seen for worsening cough on 10/23/20 and found to be COVID positive she reports family members also with COVID at this same time. She initially improved after the first few days, but felt lingering symptoms with mostly nonproductive cough so took oral antibiotics for this a few weeks later. She stopped the Enbrel while taking the antibiotics but plans to resume this today. She continues having pain in right thumb and bilateral knees are the worst affected areas. She is concerned about continued respiratory symptom in context of immunosuppressing medication and recent infection.   Previous HPI: 09/30/20 Vicki Cook is a 59 y.o. female here for evaluation with bilateral shoulder pain and history of rheumatoid arthritis and concern for possible PMR. She was previously seen with sports medicine with shoulder ultrasound consistent with inflammatory changes and treated with intraarticular injection. She was previously evaluated for rheumatoid arthritis and recommended  to start methotrexate apparently taking the medicine less than a month. Recent labs with significant inflammatory markers and having some jaw pain so concern of GCA and started prednisone taper since January but never saw vascular surgery for biopsy due to cost issues.   Labs reviewed 05/2020 ESR 103 CRP 99 CK 47 CMP wnl   2017 CCP >250 RF 22   Review of Systems  Constitutional:  Positive for fatigue.  HENT:  Negative for mouth dryness.   Eyes:  Negative for dryness.  Respiratory:  Negative for shortness of breath.   Cardiovascular:  Negative for swelling in legs/feet.  Gastrointestinal:  Negative for constipation.  Endocrine: Positive for cold intolerance and heat intolerance.  Genitourinary:  Negative for difficulty urinating.  Musculoskeletal:  Positive for joint pain, joint pain and joint swelling.  Skin:  Positive for rash.  Allergic/Immunologic: Negative for susceptible to infections.  Neurological:  Negative for weakness.  Hematological:  Negative for bruising/bleeding tendency.  Psychiatric/Behavioral:  Negative for sleep disturbance.    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   Eczema 12/11/2018   Ear congestion, right 10/12/2018   Rheumatoid arthritis (HCC) 05/04/2015   Anal pruritus 02/19/2015   Cough 07/22/2014   Panic attack 07/19/2014     Microcytic anemia 05/02/2014   Leucocytosis 05/02/2014   Pruritic rash 04/24/2014   Bilateral knee pain 03/20/2014   Healthcare maintenance 02/25/2014   HTN (hypertension) 02/07/2014   Baker's cyst of knee 07/20/2013    Past Medical History:  Diagnosis Date   H/O pinworm infection    Hypertension 2012   Rheumatoid  arthritis (HCC) 2017    Family History  Problem Relation Age of Onset   Heart disease Mother    Hyperlipidemia Mother    Hypertension Mother    Heart disease Father    Hypertension Father    Diabetes Father    Healthy Brother    Healthy Brother    Healthy Son    Healthy Son    History reviewed. No pertinent surgical history. Social History   Social History Narrative   Originally from    [place]   Has lived in [place] 1999   2 young adult sons   Husband was a professor at [employer], but imprisoned for illegal internet activity.  Patient states he was unfairly targeted.  Tried to work with [employer] to have his conviction overturned.  He was deported after release in recent years.   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   Immunization History  Administered Date(s) Administered   Influenza,inj,Quad PF,6+ Mos 02/25/2014, 02/19/2015   PFIZER(Purple Top)SARS-COV-2 Vaccination 07/27/2019, 08/20/2019   Pneumococcal Conjugate-13 09/30/2020     Objective: Vital Signs: BP (!) 172/104 (BP Location: Left Arm, Patient Position: Sitting, Cuff Size: Normal)   Pulse 87   Resp 16   Ht 5' (1.524 m)   Wt 166 lb (75.3 kg)   BMI 32.42 kg/m    Physical Exam Constitutional:      Appearance: She is obese.  Eyes:     Conjunctiva/sclera: Conjunctivae normal.  Cardiovascular:     Rate and Rhythm: Normal rate and regular rhythm.  Pulmonary:     Effort: Pulmonary effort is normal.     Breath sounds: Normal breath sounds.  Skin:    General: Skin is warm and dry.     Findings: No rash.  Neurological:     General: No focal deficit present.     Mental Status: She is alert.  Psychiatric:        Mood and Affect: Mood normal.     Musculoskeletal Exam:  Neck full ROM no tenderness Shoulders full ROM no tenderness or swelling Elbows full ROM no tenderness or swelling Wrists decreased ROM b/l left wrist mild swelling, no tenderness to pressure Fingers full ROM no tenderness or  swelling Knees with slightly decreased extension and flexion ROM b/l, mild swelling, tenderness to pressure worst at medial joint line and with end of extension movement  CDAI Exam: CDAI Score: 11  Patient Global: 40 mm; Provider Global: 20 mm Swollen: 3 ; Tender: 2  Joint Exam 03/23/2021      Right  Left  Wrist     Swollen   Knee  Swollen Tender  Swollen Tender     Investigation: No additional findings.  Imaging: No results found.  Recent Labs: Lab Results  Component Value Date   WBC 8.7 12/02/2020   HGB 11.4 12/02/2020   PLT 453 (H) 12/02/2020   NA 141 12/02/2020   K 3.7 12/02/2020   CL 104 12/02/2020   CO2 23 12/02/2020   GLUCOSE 92 12/02/2020   BUN 9 12/02/2020   CREATININE 0.42 (L) 12/02/2020   BILITOT <0.2 12/02/2020     ALKPHOS 107 12/02/2020   AST 15 12/02/2020   ALT 12 12/02/2020   PROT 7.9 12/02/2020   ALBUMIN 3.9 12/02/2020   CALCIUM 9.1 12/02/2020   GFRAA 127 05/07/2020   QFTBGOLD Negative 11/11/2015   QFTBGOLDPLUS Negative 09/30/2020    Speciality Comments: No specialty comments available.  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 Checking ESR for activity monitoring, previously very high. Plan to continue Enbrel 50 mg [place] weekly.  Chronic pain of both knees  Bilateral knee pain possibly RA activity or could also be OA with effusions. Checking ESR for disease activity monitoring. She was not interested in knee aspiration at this time. Discussed treatment options including topical diclofenac, knee braces, PT referral, or injections.  Synovial cyst of knee, unspecified laterality  Right knee baker's cyst chronic finding discussed this is almost always secondary to knee effusion not bothering her enough  today to warrant injection or aspiration.  High risk medication use - Plan: CBC with Differential/Platelet, COMPLETE METABOLIC PANEL WITH GFR  Long term use of Enbrel treatment now almost 6 months treatment checking CBC and CMP today. No new infections. I am not sure whether weight gain is related, this is not a commonly described side effect. No peripheral edema or heart failure symptoms.  Orders: Orders Placed This Encounter  Procedures   Sedimentation rate   CBC with Differential/Platelet   COMPLETE METABOLIC PANEL WITH GFR    No orders of the defined types were placed in this encounter.    Follow-Up Instructions: Return in about 3 months (around 06/23/2021) for RA on ENB f/u 63mo.   CCollier Salina MD  Note - This record has been created using [employer]  Chart creation errors have been sought, but may not always  have been located. Such creation errors do not reflect on  the standard of medical care.

## 2021-03-23 ENCOUNTER — Encounter: Payer: Self-pay | Admitting: Internal Medicine

## 2021-03-23 ENCOUNTER — Other Ambulatory Visit: Payer: Self-pay

## 2021-03-23 ENCOUNTER — Ambulatory Visit (INDEPENDENT_AMBULATORY_CARE_PROVIDER_SITE_OTHER): Payer: Self-pay | Admitting: Internal Medicine

## 2021-03-23 VITALS — BP 172/104 | HR 87 | Resp 16 | Ht 60.0 in | Wt 166.0 lb

## 2021-03-23 DIAGNOSIS — M7120 Synovial cyst of popliteal space [Baker], unspecified knee: Secondary | ICD-10-CM

## 2021-03-23 DIAGNOSIS — M25562 Pain in left knee: Secondary | ICD-10-CM

## 2021-03-23 DIAGNOSIS — M05762 Rheumatoid arthritis with rheumatoid factor of left knee without organ or systems involvement: Secondary | ICD-10-CM

## 2021-03-23 DIAGNOSIS — M25561 Pain in right knee: Secondary | ICD-10-CM

## 2021-03-23 DIAGNOSIS — Z79899 Other long term (current) drug therapy: Secondary | ICD-10-CM

## 2021-03-23 DIAGNOSIS — G8929 Other chronic pain: Secondary | ICD-10-CM

## 2021-03-23 DIAGNOSIS — M05761 Rheumatoid arthritis with rheumatoid factor of right knee without organ or systems involvement: Secondary | ICD-10-CM

## 2021-03-23 NOTE — Patient Instructions (Signed)
For osteoarthritis several treatments may be beneficial: - Topical antiinflammatory medicine such as diclofenac or Voltaren can be applied to  affected area as needed but may be less effective than oral antiinflammatory medicine. Topical analgesics containing CBD, menthol, or lidocaine can be tried. Capsaicin containing treatments are recommended against for the hand.  - Other oral supplements such as glucosamine chondroitin containing OTC treatments such as osteo bi-flex or other brands do not have strong data supporting effectiveness but can be helpful for some individuals and have no major side effects. Turmeric has some antiinflammatory effect similar to NSAID medications and may help, if taken as a supplement should not be taken above recommended doses.  - Compressive brace can be helpful to support the joint especially if hurting during or after certain activities.  - Physical therapy referral can discuss exercises or activity modification to improve symptoms or strength if needed.  - Local steroid injection is an option if symptoms become worse and not controlled by the above options.

## 2021-03-24 ENCOUNTER — Other Ambulatory Visit: Payer: Self-pay

## 2021-03-24 ENCOUNTER — Other Ambulatory Visit (HOSPITAL_COMMUNITY): Payer: Self-pay

## 2021-03-24 ENCOUNTER — Ambulatory Visit (INDEPENDENT_AMBULATORY_CARE_PROVIDER_SITE_OTHER): Payer: Self-pay | Admitting: Internal Medicine

## 2021-03-24 ENCOUNTER — Telehealth: Payer: Self-pay | Admitting: *Deleted

## 2021-03-24 ENCOUNTER — Other Ambulatory Visit (INDEPENDENT_AMBULATORY_CARE_PROVIDER_SITE_OTHER): Payer: Self-pay

## 2021-03-24 VITALS — BP 174/95 | HR 83 | Temp 98.4°F | Resp 18 | Wt 165.4 lb

## 2021-03-24 DIAGNOSIS — Z23 Encounter for immunization: Secondary | ICD-10-CM

## 2021-03-24 DIAGNOSIS — Z0000 Encounter for general adult medical examination without abnormal findings: Secondary | ICD-10-CM

## 2021-03-24 DIAGNOSIS — M05761 Rheumatoid arthritis with rheumatoid factor of right knee without organ or systems involvement: Secondary | ICD-10-CM

## 2021-03-24 DIAGNOSIS — M05762 Rheumatoid arthritis with rheumatoid factor of left knee without organ or systems involvement: Secondary | ICD-10-CM

## 2021-03-24 DIAGNOSIS — I10 Essential (primary) hypertension: Secondary | ICD-10-CM

## 2021-03-24 MED ORDER — CHLORTHALIDONE 25 MG PO TABS
12.5000 mg | ORAL_TABLET | Freq: Every day | ORAL | 0 refills | Status: DC
  Filled 2021-03-24: qty 15, 30d supply, fill #0

## 2021-03-24 MED ORDER — CHLORTHALIDONE 25 MG PO TABS
12.5000 mg | ORAL_TABLET | Freq: Every day | ORAL | 0 refills | Status: DC
  Filled 2021-03-24: qty 60, 120d supply, fill #0

## 2021-03-24 MED ORDER — LOSARTAN POTASSIUM 50 MG PO TABS
50.0000 mg | ORAL_TABLET | Freq: Every day | ORAL | 1 refills | Status: DC
  Filled 2021-03-24: qty 60, 60d supply, fill #0
  Filled 2021-03-24: qty 30, 30d supply, fill #0
  Filled 2021-05-04: qty 30, 30d supply, fill #1
  Filled 2021-06-01: qty 30, 30d supply, fill #2
  Filled 2021-07-01: qty 30, 30d supply, fill #3

## 2021-03-24 MED ORDER — LOSARTAN POTASSIUM 50 MG PO TABS
50.0000 mg | ORAL_TABLET | Freq: Every day | ORAL | 1 refills | Status: DC
Start: 2021-03-24 — End: 2021-03-24
  Filled 2021-03-24: qty 60, 60d supply, fill #0

## 2021-03-24 NOTE — Assessment & Plan Note (Signed)
Flu shot today. Would like Tdap at next visit.

## 2021-03-24 NOTE — Assessment & Plan Note (Addendum)
Patient is on losartan 50 mg daily. Pt was originally here today for lab draw, however she requested to be seen due to concern for elevated BP. She was seen by her Rheumatologist yesterday, at which time her BP was 172/104. BP of 174/95 today. The patient reports that she has been compliant with her losartan regimen. She has not missed any doses. She takes it at night, so her last dose was last night. No other complaints or concerns today.  P: I discussed our recommendation to add chlorthalidone today. Pt has not tolerated amlodipine or HCTZ in the past. Amlodipine gave her HA, and HCTZ made her feel "dry." Pt is amenable to this plan, but she states that she does not like taking more than one medication and would mostly like to lower her BP the natural way. She would, however, like to try chlorthalidone and losartan for now and would like to discuss this further at her next follow-up appt next month.   -Start chlorthalidone 12.5 mg daily -Continue losartan 50 mg daily -Discussed healthy diet and exercise as other ways to decrease her BP

## 2021-03-24 NOTE — Progress Notes (Addendum)
   CC: blood pressure check  HPI:  [name] is a 60 y.o. with past medical history as noted below who presents to the clinic today for blood pressure check. Please see problem-based list for further details, assessments, and plans.   Past Medical History:  Diagnosis Date   H/O pinworm infection    Hypertension 2012   Rheumatoid arthritis (HCC) 2017   Review of Systems:   Review of Systems  Constitutional: Negative.   HENT: Negative.    Eyes: Negative.   Respiratory: Negative.    Cardiovascular: Negative.   Gastrointestinal: Negative.   Genitourinary: Negative.   Musculoskeletal: Negative.   Skin: Negative.   Neurological: Negative.   Endo/Heme/Allergies: Negative.   Psychiatric/Behavioral: Negative.      Physical Exam:  Vitals:   03/24/21 1020  BP: (!) 174/95  Pulse: 83  Resp: 18  Temp: 98.4 F (36.9 C)  SpO2: 98%  Weight: 165 lb 6.4 oz (75 kg)   Physical Exam Constitutional:      General: She is not in acute distress.    Appearance: Normal appearance.  HENT:     Head: Normocephalic and atraumatic.  Eyes:     Extraocular Movements: Extraocular movements intact.     Pupils: Pupils are equal, round, and reactive to light.  Cardiovascular:     Rate and Rhythm: Normal rate and regular rhythm.     Heart sounds: No murmur heard.   No friction rub. No gallop.  Pulmonary:     Effort: Pulmonary effort is normal.     Breath sounds: Normal breath sounds. No wheezing, rhonchi or rales.  Abdominal:     General: Abdomen is flat. There is no distension.  Musculoskeletal:        General: Normal range of motion.  Skin:    General: Skin is warm and dry.  Neurological:     General: No focal deficit present.     Mental Status: She is alert and oriented to person, place, and time. Mental status is at baseline.  Psychiatric:        Mood and Affect: Mood normal.        Behavior: Behavior normal.    Assessment & Plan:   See Encounters Tab for problem based  charting.  Patient seen with Dr. Mayford Knife

## 2021-03-24 NOTE — Telephone Encounter (Signed)
Pat here for labs.  Requesting blood pressure recheck as B/P was elevated at office visit with Rheumatology on yesterday.  Spoke with Dr. Alroy Bailiff who will see patient today for B/P check.  Angelina Ok, RN 03/24/2021 10:20 AM.

## 2021-03-24 NOTE — Patient Instructions (Signed)
Thank you, [name] for allowing [place] to provide your care today. Today we discussed your blood pressure.  1) Your blood pressure has been high, so I added on a medication called chlorthalidone. Please take half of a tablet every day. Continue your losartan. We will discuss this further at your next follow-up, but please call us if you have any questions or concerns.      I have ordered the following medication/changed the following medications:   Start the following medications: Meds ordered this encounter  Medications   losartan (COZAAR) 50 MG tablet    Sig: Take 1 tablet (50 mg total) by mouth daily.    Dispense:  60 tablet    Refill:  1    IM Program   chlorthalidone (HYGROTON) 25 MG tablet    Sig: Take 0.5 tablets (12.5 mg total) by mouth daily.    Dispense:  60 tablet    Refill:  0     Follow up:  mid-December     Should you have any questions or concerns please call the internal medicine clinic at [phone].

## 2021-03-25 LAB — CBC WITH DIFFERENTIAL/PLATELET
Basophils Absolute: 0.1 10*3/uL (ref 0.0–0.2)
Basos: 1 %
EOS (ABSOLUTE): 0.2 10*3/uL (ref 0.0–0.4)
Eos: 2 %
Hematocrit: 38.1 % (ref 34.0–46.6)
Hemoglobin: 12.4 g/dL (ref 11.1–15.9)
Immature Grans (Abs): 0 10*3/uL (ref 0.0–0.1)
Immature Granulocytes: 0 %
Lymphocytes Absolute: 2.4 10*3/uL (ref 0.7–3.1)
Lymphs: 32 %
MCH: 25.9 pg — ABNORMAL LOW (ref 26.6–33.0)
MCHC: 32.5 g/dL (ref 31.5–35.7)
MCV: 80 fL (ref 79–97)
Monocytes Absolute: 0.6 10*3/uL (ref 0.1–0.9)
Monocytes: 8 %
Neutrophils Absolute: 4.5 10*3/uL (ref 1.4–7.0)
Neutrophils: 57 %
Platelets: 354 10*3/uL (ref 150–450)
RBC: 4.78 x10E6/uL (ref 3.77–5.28)
RDW: 14.6 % (ref 11.7–15.4)
WBC: 7.7 10*3/uL (ref 3.4–10.8)

## 2021-03-25 LAB — CMP14 + ANION GAP
ALT: 16 IU/L (ref 0–32)
AST: 17 IU/L (ref 0–40)
Albumin/Globulin Ratio: 1 — ABNORMAL LOW (ref 1.2–2.2)
Albumin: 3.9 g/dL (ref 3.8–4.9)
Alkaline Phosphatase: 116 IU/L (ref 44–121)
Anion Gap: 13 mmol/L (ref 10.0–18.0)
BUN/Creatinine Ratio: 28 — ABNORMAL HIGH (ref 9–23)
BUN: 12 mg/dL (ref 6–24)
Bilirubin Total: 0.4 mg/dL (ref 0.0–1.2)
CO2: 22 mmol/L (ref 20–29)
Calcium: 9.1 mg/dL (ref 8.7–10.2)
Chloride: 102 mmol/L (ref 96–106)
Creatinine, Ser: 0.43 mg/dL — ABNORMAL LOW (ref 0.57–1.00)
Globulin, Total: 4 g/dL (ref 1.5–4.5)
Glucose: 92 mg/dL (ref 70–99)
Potassium: 3.9 mmol/L (ref 3.5–5.2)
Sodium: 137 mmol/L (ref 134–144)
Total Protein: 7.9 g/dL (ref 6.0–8.5)
eGFR: 113 mL/min/{1.73_m2} (ref >59)

## 2021-03-25 LAB — SEDIMENTATION RATE: Sed Rate: 73 mm/hr — ABNORMAL HIGH (ref 0–40)

## 2021-03-25 NOTE — Progress Notes (Signed)
CBC and CMP look fine for continuing Enbrel as planned. ESR is decreased to 73 from 108 which is still not normal but partially improved.

## 2021-03-26 ENCOUNTER — Telehealth: Payer: Self-pay | Admitting: Student

## 2021-03-26 NOTE — Telephone Encounter (Signed)
[name] is a 60 y.o. F with past medical history of HTN and RA.   She paged the on call team at 1131pm regarding lower back pain that she states started on the day of the phone call without any clear precipitant. She states the pain is most prominent on the R side of her back and she describes her pain as a constant, sharp, non radiating pain that has persisted for about two hours. She has not tried any medications or maneuvers to help relieve the pain. Given the persistent nature of the pain she decided to call.   She denies recent trauma, h/o kidney stones, dysuria, fever, bladder or bowel incontinence, numbness, weakness or tingling in her lower extremities. She feels the pain coincides with her starting chlorthalidone, a medication that we prescribed 11/22.   I discussed with [name] that the medication is likely not causing her symptoms and given she has not tried any OTC analgesics we discussed trying APAP, NSAIDs, or topical medications.   Plan: Patient will trial tylenol and get OTC muscle rub to see if this helps with her symptoms. She would like to hold on taking her chlorthaldione and will follow up in clinic on 11/28 for further guidance regarding her antihypertensive regimen.   I plan to give patient a phone call after work 11/25 to discuss her symptoms.

## 2021-03-30 NOTE — Progress Notes (Signed)
Internal Medicine Clinic Attending ? ?I saw and evaluated the patient.  I personally confirmed the key portions of the history and exam documented by Dr. Bonanno and I reviewed pertinent patient test results.  The assessment, diagnosis, and plan were formulated together and I agree with the documentation in the resident?s note. ? ?

## 2021-03-31 ENCOUNTER — Encounter: Payer: Self-pay | Admitting: Internal Medicine

## 2021-03-31 ENCOUNTER — Telehealth: Payer: Self-pay

## 2021-03-31 NOTE — Telephone Encounter (Signed)
Received TC from patient who states she is still having lower back pain.  Please see detailed note from Dr. Montez Morita dated 11/24 regarding this complaint.  Pt states she cannot come in today for an appt, states she has to work tomorrow morning, requesting an appt after 2pm.  Blue team full, appt made w/ red team, Dr. Sloan Leiter at 1515 tomorrow 04/01/21 SChaplin, RN,BSN

## 2021-04-01 ENCOUNTER — Encounter: Payer: Self-pay | Admitting: Internal Medicine

## 2021-04-01 NOTE — Progress Notes (Deleted)
Lower back pain  Pmh of RA on enbrel  Called on-call team on 11/24 for lower back pain that started that day.  No injury or fall.  Pain is prominent of the R side of back and she described as constant, sharp, non-radiating pain.  HTN 11/22 BP Readings from Last 3 Encounters:  03/24/21 (!) 174/95  03/23/21 (!) 172/104  12/02/20 (!) 161/89   Meds losartan 50 mg, chlorthalidone 12.5 added then

## 2021-04-06 ENCOUNTER — Ambulatory Visit (INDEPENDENT_AMBULATORY_CARE_PROVIDER_SITE_OTHER): Payer: Self-pay | Admitting: Student

## 2021-04-06 ENCOUNTER — Other Ambulatory Visit (HOSPITAL_COMMUNITY): Payer: Self-pay

## 2021-04-06 ENCOUNTER — Encounter: Payer: Self-pay | Admitting: Student

## 2021-04-06 ENCOUNTER — Other Ambulatory Visit: Payer: Self-pay

## 2021-04-06 VITALS — BP 151/104 | HR 77 | Temp 97.9°F | Ht 60.0 in | Wt 167.1 lb

## 2021-04-06 DIAGNOSIS — M545 Low back pain, unspecified: Secondary | ICD-10-CM

## 2021-04-06 DIAGNOSIS — I10 Essential (primary) hypertension: Secondary | ICD-10-CM

## 2021-04-06 DIAGNOSIS — M2759 Other periradicular pathology associated with previous endodontic treatment: Secondary | ICD-10-CM

## 2021-04-06 DIAGNOSIS — Z1231 Encounter for screening mammogram for malignant neoplasm of breast: Secondary | ICD-10-CM

## 2021-04-06 DIAGNOSIS — M549 Dorsalgia, unspecified: Secondary | ICD-10-CM | POA: Insufficient documentation

## 2021-04-06 DIAGNOSIS — Z0000 Encounter for general adult medical examination without abnormal findings: Secondary | ICD-10-CM

## 2021-04-06 HISTORY — DX: Dorsalgia, unspecified: M54.9

## 2021-04-06 MED ORDER — AMLODIPINE BESYLATE 5 MG PO TABS
5.0000 mg | ORAL_TABLET | Freq: Every day | ORAL | 11 refills | Status: DC
  Filled 2021-04-06: qty 30, 30d supply, fill #0
  Filled 2021-05-04: qty 30, 30d supply, fill #1
  Filled 2021-06-01: qty 30, 30d supply, fill #2
  Filled 2021-07-01: qty 30, 30d supply, fill #3
  Filled 2021-08-04: qty 30, 30d supply, fill #4
  Filled 2021-09-06: qty 30, 30d supply, fill #5
  Filled 2021-10-07: qty 30, 30d supply, fill #6
  Filled 2021-11-06: qty 30, 30d supply, fill #7
  Filled 2021-12-08: qty 30, 30d supply, fill #8
  Filled 2022-01-08: qty 30, 30d supply, fill #9
  Filled 2022-02-08: qty 30, 30d supply, fill #10

## 2021-04-06 NOTE — Assessment & Plan Note (Signed)
Blood pressure elevated today 150/110.  She is taking losartan 50 mg daily and report adherence.  She was started on chlorthalidone 12.5 mg at last visit but was stopped due to back pain suspicious of cholelithiasis.  Patient is not willing to be back on chlorthalidone because it makes her "dry".  She also declined HCTZ.  Per notes, patient could not tolerate amlodipine in the past due to headache.  When asked about amlodipine, she could not remember the side effect.  She states that her headache may be due to her sinus issue.  She is willing to try amlodipine again.   Assessment and plan With her systolic blood pressure of 150, she will need 2 drug regimen.    - Will start amlodipine 5 mg. - Continue losartan 50 mg - Blood pressure recheck in 3 months - If she cannot tolerate amlodipine, can try beta-blocker

## 2021-04-06 NOTE — Patient Instructions (Signed)
Vicki Cook,  It was a pleasure seeing you in the clinic today.  Here is a summary what we talked about:  1.  Back pain: Your presentation could suggest a kidney stone that has passed.  Please let us know if you have new onset of back pain.  2.  Hypertension: Your blood pressure is elevated at 150/110 today.  With this high blood pressure, we usually use a 2 drug regimen.  I will add amlodipine 5 mg to your regimen.  Please take 1 tablet daily.  3.  I have ordered a mammogram and referral to dentist.  Please return in 3 months for blood pressure recheck.  Take care,  Dr. Cyndie Chime

## 2021-04-06 NOTE — Assessment & Plan Note (Addendum)
-   Ordered mammogram - Referral to dentistry for root canal issue - Confirmed with staff, Orange card and CAFA letter cover for screening colonoscopy.  Please address this next visit.

## 2021-04-06 NOTE — Assessment & Plan Note (Addendum)
Patient contacted the on-call resident on 11/24 for acute onset of right-sided back pain.  She states that the pain located in the right lower lumbar, pain was constant, it worse with urination and better after drinking a lots of water.  She is currently pain-free.  Patient was started on chlorthalidone 12.5 mg at last visit.  See that she was taking chlorthalidone 4 days prior to this back pain.  States that she has been urinating frequently and felt dry.  Assessment and plan Her presentation could suggest cholelithiasis secondary to dehydration from urinary frequency.  The stone may have passed.  Advised patient to hydrate appropriately.  Patient is not willing to be back on chlorthalidone or HCTZ.

## 2021-04-06 NOTE — Progress Notes (Signed)
   CC: Evaluation of back pain and high blood pressure  HPI:  Vicki Cook is a 60 y.o. with past medical history of hypertension, rheumatoid arthritis who presents to the clinic for follow-up on her back pain and evaluation of her hypertension.  See problem based charting for detailed  Past Medical History:  Diagnosis Date   H/O pinworm infection    Hypertension 2012   Rheumatoid arthritis (HCC) 2017   Review of Systems:  per HPI  Physical Exam:  Vitals:   04/06/21 1326  BP: (!) 151/104  Pulse: 77  Temp: 97.9 F (36.6 C)  TempSrc: Oral  SpO2: 100%  Weight: 167 lb 1.6 oz (75.8 kg)  Height: 5' (1.524 m)   Physical Exam Constitutional:      General: She is not in acute distress. HENT:     Head: Normocephalic.  Eyes:     General:        Right eye: No discharge.        Left eye: No discharge.     Conjunctiva/sclera: Conjunctivae normal.  Cardiovascular:     Rate and Rhythm: Normal rate and regular rhythm.     Heart sounds: Normal heart sounds.     Comments: No LE edema Pulmonary:     Effort: Pulmonary effort is normal. No respiratory distress.     Breath sounds: Normal breath sounds. No wheezing.  Abdominal:     Comments: No CVA tenderness  Musculoskeletal:        General: Normal range of motion.     Cervical back: Normal range of motion.  Skin:    General: Skin is warm.     Coloration: Skin is not jaundiced.  Neurological:     Mental Status: She is alert.  Psychiatric:        Mood and Affect: Mood normal.        Behavior: Behavior normal.     Assessment & Plan:   See Encounters Tab for problem based charting.  Patient discussed with Dr. Antony Contras

## 2021-04-07 NOTE — Progress Notes (Signed)
Internal Medicine Clinic Attending  Case discussed with Dr. Nguyen  At the time of the visit.  We reviewed the resident's history and exam and pertinent patient test results.  I agree with the assessment, diagnosis, and plan of care documented in the resident's note. 

## 2021-05-04 ENCOUNTER — Other Ambulatory Visit (HOSPITAL_COMMUNITY): Payer: Self-pay

## 2021-05-25 ENCOUNTER — Ambulatory Visit: Payer: Self-pay | Admitting: Dermatology

## 2021-06-01 ENCOUNTER — Other Ambulatory Visit (HOSPITAL_COMMUNITY): Payer: Self-pay

## 2021-06-14 NOTE — Progress Notes (Signed)
Office Visit Note  Patient: Vicki Cook             Date of Birth: 01-02-1961           MRN: [id number]             PCP: Harvie Heck, MD Referring: Harvie Heck, MD Visit Date: 06/15/2021   Subjective:  Follow-up (Doing good)   History of Present Illness: Vicki Cook is a 61 y.o. female here for follow up for seropositive RA on Enbrel 50 mg [place] weekly. She feels a good benefit although left knee pain and swelling is persistent. A small amount of elbow and wrist pain but no effusions like in her knee. She hs not suffered any serious infections or other complications.   Previous HPI 03/23/21 Vicki Cook is a 61 y.o. female here for follow up for seropositive RA on Enbrel 50 mg [place] weekly. She feels that symptoms are partially improved on the Enbrel. Hands and wrists are significantly improved. She continues having bilateral knee swelling especially on right side. She takes occasional tylenol for symptoms. She has gained at least 5 pounds over the past few months despite trying to stay active.   Previous HPI: 09/30/20 Vicki Cook is a 61 y.o. female here for evaluation with bilateral shoulder pain and history of rheumatoid arthritis and concern for possible PMR. She was previously seen with sports medicine with shoulder ultrasound consistent with inflammatory changes and treated with intraarticular injection. She was previously evaluated for rheumatoid arthritis and recommended to start methotrexate apparently taking the medicine less than a month. Recent labs with significant inflammatory markers and having some jaw pain so concern of GCA and started prednisone taper since January but never saw vascular surgery for biopsy due to cost issues.   Review of Systems  Constitutional:  Positive for fatigue.  HENT:  Negative for mouth dryness.   Eyes:  Negative for dryness.  Respiratory:  Negative for shortness of breath.   Cardiovascular:  Negative for swelling in legs/feet.   Gastrointestinal:  Negative for constipation.  Endocrine: Positive for excessive thirst.  Genitourinary:  Negative for difficulty urinating.  Musculoskeletal:  Positive for joint pain, joint pain and joint swelling.  Skin:  Negative for rash.  Allergic/Immunologic: Negative for susceptible to infections.  Neurological:  Negative for numbness.  Hematological:  Negative for bruising/bleeding tendency.  Psychiatric/Behavioral:  Positive for sleep disturbance.    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   Eczema 12/11/2018   Rheumatoid arthritis ([place]) 05/04/2015   Cough 07/22/2014   Panic attack 07/19/2014   Microcytic anemia 05/02/2014   Bilateral knee pain 03/20/2014   Healthcare maintenance 02/25/2014   HTN (hypertension) 02/07/2014    Past Medical History:  Diagnosis Date   H/O pinworm infection    Hypertension 2012   Rheumatoid arthritis ([place]) 2017    Family History  Problem Relation Age of Onset   Heart disease Mother    Hyperlipidemia Mother    Hypertension Mother    Heart disease Father    Hypertension Father    Diabetes Father    Healthy Brother    Healthy Brother    Healthy Son    Healthy Son    History reviewed. No pertinent surgical history. Social  History   Social History Narrative   Originally from    [place]   Has lived in [place]. Since 1999   2 young adult sons   Husband was a professor at [employer], but imprisoned for illegal internet activity.  Patient states he was unfairly targeted.  Tried to work with Leanne Chang to have his conviction overturned.  He was deported after release in recent years.   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   Immunization History  Administered Date(s) Administered   Influenza,inj,Quad  PF,6+ Mos 02/25/2014, 02/19/2015, 03/24/2021   PFIZER(Purple Top)SARS-COV-2 Vaccination 07/27/2019, 08/20/2019   Pneumococcal Conjugate-13 09/30/2020     Objective: Vital Signs: BP 133/88 (BP Location: Left Arm, Patient Position: Sitting, Cuff Size: Normal)    Pulse 73    Resp 16    Ht 5' (1.524 m)    Wt 162 lb (73.5 kg)    BMI 31.64 kg/m    Physical Exam Cardiovascular:     Rate and Rhythm: Normal rate and regular rhythm.  Pulmonary:     Effort: Pulmonary effort is normal.     Breath sounds: Normal breath sounds.  Musculoskeletal:     Right lower leg: No edema.     Left lower leg: No edema.  Skin:    General: Skin is warm and dry.     Findings: No rash.  Neurological:     Mental Status: She is alert.  Psychiatric:        Mood and Affect: Mood normal.     Musculoskeletal Exam:  Shoulders full ROM no tenderness or swelling Elbows decreased extension ROM bilaterally, lateral tenderness to palpation on left elbow right elbow more on medial side Wrists decreased flexion and extension ROM no tenderness or effusions Fingers full ROM no tenderness or swelling Right knee normal left knee swelling present, tenderness anteriorly and with resisted extension, decreased extension ROM Ankles full ROM no tenderness or swelling   CDAI Exam: CDAI Score: 9  Patient Global: 30 mm; Provider Global: 20 mm Swollen: 1 ; Tender: 3  Joint Exam 06/15/2021      Right  Left  Elbow   Tender   Tender  Knee     Swollen Tender     Investigation: No additional findings.  Imaging: No results found.  Recent Labs: Lab Results  Component Value Date   WBC 7.7 03/24/2021   HGB 12.4 03/24/2021   PLT 354 03/24/2021   NA 137 03/24/2021   K 3.9 03/24/2021   CL 102 03/24/2021   CO2 22 03/24/2021   GLUCOSE 92 03/24/2021   BUN 12 03/24/2021   CREATININE 0.43 (L) 03/24/2021   BILITOT 0.4 03/24/2021   ALKPHOS 116 03/24/2021   AST 17 03/24/2021   ALT 16 03/24/2021   PROT 7.9 03/24/2021    ALBUMIN 3.9 03/24/2021   CALCIUM 9.1 03/24/2021   GFRAA 127 05/07/2020   QFTBGOLD Negative 11/11/2015   QFTBGOLDPLUS Negative 09/30/2020    Speciality Comments: No specialty comments available.  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 Checking sed rate for activity monitoring. We discussed option of trying to switch medications versus continuing Enbrel as it is possible alternative could be worse.  She anticipates change in insurance/CFA or needing renewal coming up I recommend we continue current Enbrel 50 mg [place] weekly which is mostly effective and revisit at follow up.  High risk medication use - Plan: CBC with Differential/Platelet, COMPLETE METABOLIC PANEL WITH GFR  Checking CBC and CMP today for Enbrel medication monitoring.  Orders: Orders Placed This Encounter  Procedures   Sedimentation rate   CBC with Differential/Platelet   COMPLETE METABOLIC PANEL WITH GFR   No orders of the defined types were placed in this encounter.    Follow-Up Instructions: Return in about 3 months (around 09/12/2021) for RA on ENB f/u 49mos.   Collier Salina, MD  Note - This record has been created using [employer].  Chart creation errors have been sought, but may not always  have been located. Such creation errors do not reflect on  the standard of medical care.

## 2021-06-15 ENCOUNTER — Ambulatory Visit (INDEPENDENT_AMBULATORY_CARE_PROVIDER_SITE_OTHER): Payer: Self-pay | Admitting: Internal Medicine

## 2021-06-15 ENCOUNTER — Encounter: Payer: Self-pay | Admitting: Internal Medicine

## 2021-06-15 ENCOUNTER — Other Ambulatory Visit: Payer: Self-pay

## 2021-06-15 VITALS — BP 133/88 | HR 73 | Resp 16 | Ht 60.0 in | Wt 162.0 lb

## 2021-06-15 DIAGNOSIS — Z79899 Other long term (current) drug therapy: Secondary | ICD-10-CM

## 2021-06-15 DIAGNOSIS — M05761 Rheumatoid arthritis with rheumatoid factor of right knee without organ or systems involvement: Secondary | ICD-10-CM

## 2021-06-15 DIAGNOSIS — M05762 Rheumatoid arthritis with rheumatoid factor of left knee without organ or systems involvement: Secondary | ICD-10-CM

## 2021-06-16 ENCOUNTER — Encounter: Payer: Self-pay | Admitting: Internal Medicine

## 2021-06-16 ENCOUNTER — Ambulatory Visit: Payer: Self-pay | Admitting: Dermatology

## 2021-07-02 ENCOUNTER — Other Ambulatory Visit (HOSPITAL_COMMUNITY): Payer: Self-pay

## 2021-07-06 ENCOUNTER — Other Ambulatory Visit (INDEPENDENT_AMBULATORY_CARE_PROVIDER_SITE_OTHER): Payer: Self-pay

## 2021-07-06 ENCOUNTER — Other Ambulatory Visit: Payer: Self-pay

## 2021-07-06 DIAGNOSIS — M05761 Rheumatoid arthritis with rheumatoid factor of right knee without organ or systems involvement: Secondary | ICD-10-CM

## 2021-07-06 DIAGNOSIS — Z79899 Other long term (current) drug therapy: Secondary | ICD-10-CM

## 2021-07-06 DIAGNOSIS — M05762 Rheumatoid arthritis with rheumatoid factor of left knee without organ or systems involvement: Secondary | ICD-10-CM

## 2021-07-07 LAB — CMP14 + ANION GAP
ALT: 21 IU/L (ref 0–32)
AST: 19 IU/L (ref 0–40)
Albumin/Globulin Ratio: 0.9 — ABNORMAL LOW (ref 1.2–2.2)
Albumin: 4 g/dL (ref 3.8–4.9)
Alkaline Phosphatase: 108 IU/L (ref 44–121)
Anion Gap: 14 mmol/L (ref 10.0–18.0)
BUN/Creatinine Ratio: 29 — ABNORMAL HIGH (ref 9–23)
BUN: 12 mg/dL (ref 6–24)
Bilirubin Total: 0.3 mg/dL (ref 0.0–1.2)
CO2: 22 mmol/L (ref 20–29)
Calcium: 9.3 mg/dL (ref 8.7–10.2)
Chloride: 103 mmol/L (ref 96–106)
Creatinine, Ser: 0.42 mg/dL — ABNORMAL LOW (ref 0.57–1.00)
Globulin, Total: 4.5 g/dL (ref 1.5–4.5)
Glucose: 92 mg/dL (ref 70–99)
Potassium: 4.1 mmol/L (ref 3.5–5.2)
Sodium: 139 mmol/L (ref 134–144)
Total Protein: 8.5 g/dL (ref 6.0–8.5)
eGFR: 113 mL/min/{1.73_m2} (ref >59)

## 2021-07-07 LAB — SEDIMENTATION RATE: Sed Rate: 81 mm/hr — ABNORMAL HIGH (ref 0–40)

## 2021-07-08 ENCOUNTER — Encounter: Payer: Self-pay | Admitting: *Deleted

## 2021-07-08 LAB — CBC WITH DIFFERENTIAL/PLATELET
Basophils Absolute: 0.1 10*3/uL (ref 0.0–0.2)
Basos: 1 %
EOS (ABSOLUTE): 0.2 10*3/uL (ref 0.0–0.4)
Eos: 3 %
Hematocrit: 40.4 % (ref 34.0–46.6)
Hemoglobin: 12.8 g/dL (ref 11.1–15.9)
Immature Grans (Abs): 0 10*3/uL (ref 0.0–0.1)
Immature Granulocytes: 0 %
Lymphocytes Absolute: 2.8 10*3/uL (ref 0.7–3.1)
Lymphs: 37 %
MCH: 26.6 pg (ref 26.6–33.0)
MCHC: 31.7 g/dL (ref 31.5–35.7)
MCV: 84 fL (ref 79–97)
Monocytes Absolute: 0.5 10*3/uL (ref 0.1–0.9)
Monocytes: 7 %
Neutrophils Absolute: 4 10*3/uL (ref 1.4–7.0)
Neutrophils: 52 %
Platelets: 309 10*3/uL (ref 150–450)
RBC: 4.82 x10E6/uL (ref 3.77–5.28)
RDW: 14.3 % (ref 11.7–15.4)
WBC: 7.7 10*3/uL (ref 3.4–10.8)

## 2021-07-09 NOTE — Progress Notes (Signed)
Lab results look fine for continuing Enbrel although her sedimentation rate test is staying quite high indicating continued inflammation. We are planning to discuss whether changing from Enbrel would help in next follow up.

## 2021-08-04 ENCOUNTER — Other Ambulatory Visit: Payer: Self-pay | Admitting: Internal Medicine

## 2021-08-04 DIAGNOSIS — I10 Essential (primary) hypertension: Secondary | ICD-10-CM

## 2021-08-05 ENCOUNTER — Other Ambulatory Visit (HOSPITAL_COMMUNITY): Payer: Self-pay

## 2021-08-05 MED ORDER — LOSARTAN POTASSIUM 50 MG PO TABS
50.0000 mg | ORAL_TABLET | Freq: Every day | ORAL | 1 refills | Status: DC
  Filled 2021-08-05: qty 30, 30d supply, fill #0
  Filled 2021-09-06: qty 30, 30d supply, fill #1
  Filled 2021-10-07: qty 30, 30d supply, fill #2
  Filled 2021-11-06: qty 30, 30d supply, fill #3
  Filled 2021-12-08: qty 30, 30d supply, fill #4
  Filled 2022-01-08: qty 30, 30d supply, fill #5
  Filled 2022-02-12: qty 3, 3d supply, fill #6

## 2021-08-06 ENCOUNTER — Encounter: Payer: Self-pay | Admitting: Internal Medicine

## 2021-08-09 ENCOUNTER — Telehealth: Payer: Self-pay | Admitting: Internal Medicine

## 2021-08-09 NOTE — Telephone Encounter (Signed)
Patient called stating she is due to pick up her blood pressure medications tomorrow after work.  States that she thought she had some low-dose losartan 1 tablets at home and took 3 of them on Friday thinking they were 310 mg tablets.  Turns out the medication was loratadine.  She did not realize this till Saturday afternoon when she went grocery shopping and realize she did not feel well.  She was having a headache.  Denies any chest pain, shortness of breath, changes in her vision, lightheadedness, dizziness or lower extremity swelling.  She did notice some tiredness as well.  She called wondering if she should start her blood pressure medications today or if it is okay to wait till tomorrow until she is able to pick them up.  Recommended that she check her blood pressure at home.  Patient states that she does have a monitor but needs to change out the batteries.  She will call me back if her blood pressure readings are elevated.  Recommended if they are greater than 170 or 180 that I can send in her blood pressure medications to a different pharmacy that she is able to pick up today.  She is continue to have a very small headache since taking increased dose of loratadine.  Its half-life is 8 hours and active metabolites half-life is 28 hours.  Recommend that she keep monitoring for any symptoms such as headache, palpitations or lethargy.  Given that has been 48 hours and patient is only having a mild headache I do believe it would be okay to continue monitoring her symptoms.  Patient expressed understanding and will call back with readings if elevated or any symptoms worsen.  Also discussed that if she develops any headaches, chest pain or shortness of breath I would recommend urgent care or ER evaluation.  ? ? ?Lerry Liner, DO ?PGY-3 IMTS  ?

## 2021-08-12 ENCOUNTER — Other Ambulatory Visit: Payer: Self-pay | Admitting: Internal Medicine

## 2021-08-12 DIAGNOSIS — M05761 Rheumatoid arthritis with rheumatoid factor of right knee without organ or systems involvement: Secondary | ICD-10-CM

## 2021-08-12 NOTE — Telephone Encounter (Signed)
Next Visit: 09/14/2021 ? ?Last Visit: 06/15/2021 ? ?Last Fill: 01/26/2021 ? ?[id number] arthritis involving both knees with positive rheumatoid factor  ? ?Current Dose per office note 06/15/2021: Enbrel 50 mg [place] weekly  ? ?Labs: 07/06/2021 Lab results look fine for continuing Enbrel  ? ?TB Gold: 09/30/2020 Neg   ? ?Okay to refill Enbrel?  ?

## 2021-08-12 NOTE — Telephone Encounter (Signed)
Patient called the office requesting a refill of Enbrel 50mg /ml be sent to [facility]. ?

## 2021-08-14 MED ORDER — ENBREL MINI 50 MG/ML ~~LOC~~ SOCT: 50.0000 mg | SUBCUTANEOUS | 0 refills | Status: DC

## 2021-08-18 ENCOUNTER — Telehealth: Payer: Self-pay

## 2021-08-18 ENCOUNTER — Other Ambulatory Visit: Payer: Self-pay | Admitting: *Deleted

## 2021-08-18 DIAGNOSIS — M05761 Rheumatoid arthritis with rheumatoid factor of right knee without organ or systems involvement: Secondary | ICD-10-CM

## 2021-08-18 NOTE — Telephone Encounter (Signed)
Spoke with [facility] and [employer] whom both verify that have received the prescription and patient may call to set up shipment. Spoke with patient and advised her to call to set up shipment.  ?

## 2021-08-18 NOTE — Telephone Encounter (Signed)
Patient left a voicemail stating she called [employer] and was told they didn't receive prescription refill for her Enbrel.   ?

## 2021-09-07 ENCOUNTER — Other Ambulatory Visit (HOSPITAL_COMMUNITY): Payer: Self-pay

## 2021-09-07 NOTE — Progress Notes (Deleted)
Office Visit Note  Patient: Vicki Cook             Date of Birth: 13-Oct-1960           MRN: [id number]             PCP: Eliezer Bottom, MD Referring: Eliezer Bottom, MD Visit Date: 09/14/2021   Subjective:  No chief complaint on file.   History of Present Illness: Vicki Cook is a 61 y.o. female here for follow up for seropositive RA on Enbrel 50 mg [place] weekly.   Previous HPI 06/15/2021  Vicki Cook is a 61 y.o. female here for follow up for seropositive RA on Enbrel 50 mg [place] weekly. She feels a good benefit although left knee pain and swelling is persistent. A small amount of elbow and wrist pain but no effusions like in her knee. She hs not suffered any serious infections or other complications.    Previous HPI 03/23/21 Vicki Cook is a 61 y.o. female here for follow up for seropositive RA on Enbrel 50 mg  weekly. She feels that symptoms are partially improved on the Enbrel. Hands and wrists are significantly improved. She continues having bilateral knee swelling especially on right side. She takes occasional tylenol for symptoms. She has gained at least 5 pounds over the past few months despite trying to stay active.   Previous HPI: 09/30/20 Vicki Cook is a 61 y.o. female here for evaluation with bilateral shoulder pain and history of rheumatoid arthritis and concern for possible PMR. She was previously seen with sports medicine with shoulder ultrasound consistent with inflammatory changes and treated with intraarticular injection. She was previously evaluated for rheumatoid arthritis and recommended to start methotrexate apparently taking the medicine less than a month. Recent labs with significant inflammatory markers and having some jaw pain so concern of GCA and started prednisone taper since January but never saw vascular surgery for biopsy due to cost issues.   No Rheumatology ROS completed.   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   Eczema 12/11/2018   Rheumatoid arthritis (HCC) 05/04/2015   Cough 07/22/2014   Panic attack 07/19/2014   Microcytic anemia 05/02/2014   Bilateral knee pain 03/20/2014   Healthcare maintenance 02/25/2014   HTN (hypertension) 02/07/2014    Past Medical History:  Diagnosis Date   H/O pinworm infection    Hypertension 2012   Rheumatoid arthritis (HCC) 2017    Family History  Problem Relation Age of Onset   Heart disease Mother    Hyperlipidemia Mother    Hypertension Mother    Heart disease Father    Hypertension Father    Diabetes Father    Healthy Brother    Healthy Brother    Healthy Son    Healthy Son    No past surgical history on file. Social History   Social History Narrative   Originally from    [place]   Has lived in [place]. Since 1999   2 young adult sons   Husband was a professor at [employer], but imprisoned for illegal internet activity.  Patient states he was unfairly targeted.  Tried to work with Suzie Portela to have his conviction overturned.  He was deported after release in recent  years.   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   Immunization History  Administered Date(s) Administered   Influenza,inj,Quad PF,6+ Mos 02/25/2014, 02/19/2015, 03/24/2021   PFIZER(Purple Top)SARS-COV-2 Vaccination 07/27/2019, 08/20/2019   Pneumococcal Conjugate-13 09/30/2020     Objective: Vital Signs: There were no vitals taken for this visit.   Physical Exam   Musculoskeletal Exam: ***  CDAI Exam: CDAI Score: -- Patient Global: --; Provider Global: -- Swollen: --; Tender: -- Joint Exam 09/14/2021   No joint exam has been documented for this visit   There is currently no information documented on the homunculus. Go to the Rheumatology activity  and complete the homunculus joint exam.  Investigation: No additional findings.  Imaging: No results found.  Recent Labs: Lab Results  Component Value Date   WBC 7.7 07/06/2021   HGB 12.8 07/06/2021   PLT 309 07/06/2021   NA 139 07/06/2021   K 4.1 07/06/2021   CL 103 07/06/2021   CO2 22 07/06/2021   GLUCOSE 92 07/06/2021   BUN 12 07/06/2021   CREATININE 0.42 (L) 07/06/2021   BILITOT 0.3 07/06/2021   ALKPHOS 108 07/06/2021   AST 19 07/06/2021   ALT 21 07/06/2021   PROT 8.5 07/06/2021   ALBUMIN 4.0 07/06/2021   CALCIUM 9.3 07/06/2021   GFRAA 127 05/07/2020   QFTBGOLD Negative 11/11/2015   QFTBGOLDPLUS Negative 09/30/2020    Speciality Comments: No specialty comments available.  Procedures:  No procedures performed Allergies: Asa [aspirin] and Lisinopril   Assessment / Plan:     Visit Diagnoses: No diagnosis found.  ***  Orders: No orders of the defined types were placed in this encounter.  No orders of the defined types were placed in this encounter.    Follow-Up Instructions: No follow-ups on file.   Ellen HenriMarissa C Jullian Clayson, CMA  Note - This record has been created using [employer] software.  Chart creation errors have been sought, but may not always  have been located. Such creation errors do not reflect on  the standard of medical care.

## 2021-09-14 ENCOUNTER — Ambulatory Visit: Payer: Self-pay | Admitting: Internal Medicine

## 2021-09-14 DIAGNOSIS — M05761 Rheumatoid arthritis with rheumatoid factor of right knee without organ or systems involvement: Secondary | ICD-10-CM

## 2021-09-14 DIAGNOSIS — Z79899 Other long term (current) drug therapy: Secondary | ICD-10-CM

## 2021-10-08 ENCOUNTER — Other Ambulatory Visit (HOSPITAL_COMMUNITY): Payer: Self-pay

## 2021-10-09 ENCOUNTER — Other Ambulatory Visit (HOSPITAL_COMMUNITY): Payer: Self-pay

## 2021-10-12 ENCOUNTER — Ambulatory Visit: Payer: Self-pay | Admitting: Dermatology

## 2021-11-06 ENCOUNTER — Other Ambulatory Visit (HOSPITAL_COMMUNITY): Payer: Self-pay

## 2021-11-13 ENCOUNTER — Other Ambulatory Visit (HOSPITAL_COMMUNITY): Payer: Self-pay

## 2021-11-13 MED ORDER — PREDNISONE 20 MG PO TABS
40.0000 mg | ORAL_TABLET | Freq: Every day | ORAL | 0 refills | Status: DC
  Filled 2021-11-13: qty 10, 5d supply, fill #0

## 2021-11-13 MED ORDER — AZITHROMYCIN 250 MG PO TABS
ORAL_TABLET | ORAL | 0 refills | Status: AC
  Filled 2021-11-13: qty 6, 5d supply, fill #0

## 2021-11-25 ENCOUNTER — Telehealth: Payer: Self-pay | Admitting: Internal Medicine

## 2021-11-25 NOTE — Telephone Encounter (Signed)
Patient advised she will need to have her labs updated prior to being able to refill her prescription. Patient has applied for the [employer]. Patient advised she can come pick up a prescription and have it done. Patient will come by tomorrow to pick it up.

## 2021-11-25 NOTE — Telephone Encounter (Signed)
Patient called requesting prescription refill of Enbrel to be sent to [facility].   °

## 2021-11-26 ENCOUNTER — Telehealth: Payer: Self-pay | Admitting: Pharmacist

## 2021-11-26 NOTE — Telephone Encounter (Signed)
Prescription for labs written and left at front desk for patient to pick up.

## 2021-11-26 NOTE — Telephone Encounter (Signed)
Patient uninsured. [employer] pt assistance enrollment for Enbrel expired in June 2023. Patient to stop by clinic today to complete renewal application. Provider portion completed and is on Corra Kaine's desk.  Chesley Mires, PharmD, MPH, BCPS, CPP Clinical Pharmacist (Rheumatology and Pulmonology)

## 2021-11-26 NOTE — Telephone Encounter (Signed)
Patient can be provided with one Enbrel Mini sample. Labeled in fridge but not logged out in binder.  She will also have to complete pt assistance application which is at front desk. Patient should complete at office to expedite turnaround time. Explained this to Dennie Fetters, PharmD, MPH, BCPS, CPP Clinical Pharmacist (Rheumatology and Pulmonology)

## 2021-11-30 ENCOUNTER — Other Ambulatory Visit: Payer: Self-pay

## 2021-11-30 ENCOUNTER — Other Ambulatory Visit: Payer: Self-pay | Admitting: Internal Medicine

## 2021-11-30 DIAGNOSIS — M05761 Rheumatoid arthritis with rheumatoid factor of right knee without organ or systems involvement: Secondary | ICD-10-CM

## 2021-11-30 NOTE — Telephone Encounter (Signed)
Submitted Patient Assistance RENEWAL Application to [employer] for ENBREL along with provider portion, patient portion, and med list. Will update patient when we receive a response.  Fax# [phone] Phone# [phone]  Chesley Mires, PharmD, MPH, BCPS, CPP Clinical Pharmacist (Rheumatology and Pulmonology)

## 2021-11-30 NOTE — Telephone Encounter (Signed)
Patient called stating she went to have labwork today, but was told her orange card expired and hasn't received approval for CFA.  Patient states she could not afford self-pay price.  Patient states she will have labwork once she has been approved.

## 2021-12-02 MED ORDER — ENBREL MINI 50 MG/ML ~~LOC~~ SOCT: 50.0000 mg | SUBCUTANEOUS | 0 refills | Status: DC

## 2021-12-02 NOTE — Telephone Encounter (Signed)
I am okay with refilling the Enbrel prior to repeat labs for now but definitely needs to have these updated during this year ideally by next appointment in September.

## 2021-12-02 NOTE — Telephone Encounter (Signed)
Next Visit: 01/06/2022   Last Visit: 06/15/2021   Last Fill: 01/26/2021   [id number] arthritis involving both knees with positive rheumatoid factor    Current Dose per office note 06/15/2021: Enbrel 50 mg [place] weekly    Labs: 07/06/2021 Lab results look fine for continuing Enbrel    TB Gold: 09/30/2020 Neg     Okay to refill Enbrel?

## 2021-12-09 ENCOUNTER — Other Ambulatory Visit (HOSPITAL_COMMUNITY): Payer: Self-pay

## 2021-12-12 ENCOUNTER — Other Ambulatory Visit: Payer: Self-pay | Admitting: Student

## 2021-12-12 NOTE — Progress Notes (Signed)
Patient called into after hours number as she is having a flair of her rheumatoid arthritis. She has been unable to see rheumatology for lab work or a sample of her RA medication Enbrel. Her patient assistance has expired for the year and she is unable to afford her appointments. Discussed will need to be evaluated before I know what I can prescribe. Recommended taking tylenol/ibuprofen and follow up with the clinic on Monday. Patient agreeable to call to discuss further.

## 2021-12-14 ENCOUNTER — Telehealth: Payer: Self-pay | Admitting: Internal Medicine

## 2021-12-14 DIAGNOSIS — M05761 Rheumatoid arthritis with rheumatoid factor of right knee without organ or systems involvement: Secondary | ICD-10-CM

## 2021-12-14 NOTE — Telephone Encounter (Signed)
Patient left a voicemail stating she is still trying to get CFA and is having a hard time going without medication. Patient requests a call back with what she can do.

## 2021-12-16 ENCOUNTER — Other Ambulatory Visit (HOSPITAL_COMMUNITY): Payer: Self-pay

## 2021-12-16 MED ORDER — PREDNISONE 10 MG PO TABS
ORAL_TABLET | ORAL | 0 refills | Status: AC
  Filled 2021-12-16: qty 15, 10d supply, fill #0

## 2021-12-16 NOTE — Telephone Encounter (Signed)
Received a fax from  [employer] regarding an approval for ENBREL patient assistance from 12/15/21 to 12/16/2022.   Phone number: [phone]  Called patient and advised. She states she received call and has scheduled shipment already.  She is also requesting prednisone - message routed to [place], [place]  Chesley Mires, PharmD, MPH, BCPS, CPP Clinical Pharmacist (Rheumatology and Pulmonology)

## 2021-12-16 NOTE — Telephone Encounter (Signed)
Patient called again and inquired if she can be prescribed prednisone since she has been off of Enbrel for several weeks. She reports a flare and significant pain and swelling that has prevented her from doing her job tese past few weeks.  She was recently re-approved for [employer] pt assistance and has shipment scheduled.  Chesley Mires, PharmD, MPH, BCPS, CPP Clinical Pharmacist (Rheumatology and Pulmonology)

## 2021-12-16 NOTE — Telephone Encounter (Signed)
I sent new Rx for prednisone 10 mg recommend take 2 tablets daily x5 days then 1 tablet daily x5 days for current flare up.

## 2021-12-17 ENCOUNTER — Telehealth: Payer: Self-pay

## 2021-12-17 NOTE — Telephone Encounter (Signed)
Advised patient that Dr. Dimple Casey sent new Rx for prednisone 10 mg and that he recommends she take 2 tablets daily x5 days then 1 tablet daily x5 days for current flare up. Patient expressed verbal understanding of lab results.

## 2021-12-25 ENCOUNTER — Telehealth: Payer: Self-pay

## 2021-12-25 DIAGNOSIS — M05761 Rheumatoid arthritis with rheumatoid factor of right knee without organ or systems involvement: Secondary | ICD-10-CM

## 2021-12-25 NOTE — Telephone Encounter (Signed)
Patient called stating that her Enbrel auto injector has expired and that she needs a new prescription. Called the number [name] gave me [phone] and they weren't sure how to help me so will call back when they figure that out.

## 2021-12-28 NOTE — Telephone Encounter (Signed)
Form sent now, thanks

## 2021-12-29 ENCOUNTER — Telehealth: Payer: Self-pay

## 2021-12-29 NOTE — Telephone Encounter (Signed)
Called patient to inform her that her Enbrel AutoTouch will be sent to her soon.

## 2021-12-30 NOTE — Progress Notes (Signed)
Office Visit Note  Patient: Vicki Cook             Date of Birth: 01-23-1961           MRN: [id number]             PCP: Morene Crocker, MD Referring: Morene Crocker,* Visit Date: 01/06/2022   Subjective:  Follow-up (Knee pain and swelling. Hand pain and wrist swelling. Awaiting lab results from yesterday to get Enbrel Rx. )   History of Present Illness: Vicki Cook is a 61 y.o. female here for follow up for seropositive RA on Enbrel 50 mg [place] weekly. She was unable to get her regular labs drawn awaiting renewal of orange card for this year. She did restart the Enbrel since about 2 weeks ago but was off this for 2 months and had a lot of increased pain and swelling. Including finger swelling which she did not have before.  Previous HPI 06/15/2021 Vicki Cook is a 61 y.o. female here for follow up for seropositive RA on Enbrel 50 mg [place] weekly. She feels a good benefit although left knee pain and swelling is persistent. A small amount of elbow and wrist pain but no effusions like in her knee. She hs not suffered any serious infections or other complications.    Previous HPI 03/23/21 Vicki Cook is a 61 y.o. female here for follow up for seropositive RA on Enbrel 50 mg [place] weekly. She feels that symptoms are partially improved on the Enbrel. Hands and wrists are significantly improved. She continues having bilateral knee swelling especially on right side. She takes occasional tylenol for symptoms. She has gained at least 5 pounds over the past few months despite trying to stay active.   Previous HPI: 09/30/20 Vicki Cook is a 61 y.o. female here for evaluation with bilateral shoulder pain and history of rheumatoid arthritis and concern for possible PMR. She was previously seen with sports medicine with shoulder ultrasound consistent with inflammatory changes and treated with intraarticular injection. She was previously evaluated for rheumatoid arthritis  and recommended to start methotrexate apparently taking the medicine less than a month. Recent labs with significant inflammatory markers and having some jaw pain so concern of GCA and started prednisone taper since January but never saw vascular surgery for biopsy due to cost issues.     Review of Systems  Constitutional:  Negative for fatigue.  HENT:  Positive for mouth dryness. Negative for mouth sores.   Eyes:  Negative for dryness.  Respiratory:  Negative for shortness of breath.   Cardiovascular:  Negative for chest pain and palpitations.  Gastrointestinal:  Negative for blood in stool, constipation and diarrhea.  Endocrine: Negative for increased urination.  Genitourinary:  Negative for involuntary urination.  Musculoskeletal:  Positive for joint pain, gait problem, joint pain, joint swelling and morning stiffness. Negative for myalgias, muscle weakness, muscle tenderness and myalgias.  Skin:  Positive for color change and sensitivity to sunlight. Negative for hair loss.  Allergic/Immunologic: Negative for susceptible to infections.  Neurological:  Negative for dizziness and headaches.  Hematological:  Negative for swollen glands.  Psychiatric/Behavioral:  Negative for depressed mood and sleep disturbance. The patient is not nervous/anxious.     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   Eczema 12/11/2018   Rheumatoid arthritis (HCC) 05/04/2015   Cough 07/22/2014   Panic attack 07/19/2014   Microcytic anemia 05/02/2014   Bilateral knee pain 03/20/2014   Healthcare maintenance 02/25/2014   HTN (hypertension) 02/07/2014    Past Medical History:  Diagnosis Date   H/O pinworm infection    Hypertension 2012   Rheumatoid arthritis (HCC) 2017     Family History  Problem Relation Age of Onset   Heart disease Mother    Hyperlipidemia Mother    Hypertension Mother    Heart disease Father    Hypertension Father    Diabetes Father    Healthy Brother    Healthy Brother    Healthy Son    Healthy Son    History reviewed. No pertinent surgical history. Social History   Social History Narrative   Originally from    [place]   Has lived in [place]. Since 1999   2 young adult sons   Husband was a professor at [employer], but imprisoned for illegal internet activity.  Patient states he was unfairly targeted.  Tried to work with Suzie Portela to have his conviction overturned.  He was deported after release in recent years.   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   Immunization History  Administered Date(s) Administered   Influenza,inj,Quad PF,6+ Mos 02/25/2014, 02/19/2015, 03/24/2021   PFIZER(Purple Top)SARS-COV-2 Vaccination 07/27/2019, 08/20/2019   Pneumococcal Conjugate-13 09/30/2020     Objective: Vital Signs: BP 135/83 (BP Location: Left Arm, Patient Position: Sitting, Cuff Size: Normal)   Pulse (!) 102   Resp 16   Ht 5' (1.524 m)   Wt 168 lb 12.8 oz (76.6 kg)   BMI 32.97 kg/m    Physical Exam Cardiovascular:     Rate and Rhythm: Normal rate and regular rhythm.  Pulmonary:     Effort: Pulmonary effort is normal.     Breath sounds: Normal breath sounds.  Skin:    General: Skin is warm and dry.     Findings: No rash.  Neurological:     Mental Status: She is alert.  Psychiatric:        Mood and Affect: Mood normal.      Musculoskeletal Exam:  Shoulders full ROM no tenderness or swelling Elbows decreased extension ROM b/l, mild tenderness to pressure no palpable swelling Wrists full ROM no tenderness or swelling Fingers full ROM no tenderness or swelling Knees bilateral swelling and tenderness, more swollen on left, decreased extension and flexion ROM Ankles full ROM no tenderness or swelling   CDAI  Exam: CDAI Score: 15  Patient Global: 50 mm; Provider Global: 40 mm Swollen: 2 ; Tender: 4  Joint Exam 01/06/2022      Right  Left  Elbow   Tender   Tender  Knee  Swollen Tender  Swollen Tender     Investigation: No additional findings.  Imaging: No results found.  Recent Labs: Lab Results  Component Value Date   WBC 9.6 01/05/2022   HGB 11.7 01/05/2022   PLT 297 01/05/2022   NA 139 01/05/2022   K 3.5 01/05/2022   CL 101 01/05/2022   CO2 23 01/05/2022   GLUCOSE 172 (H) 01/05/2022   BUN 11 01/05/2022   CREATININE 0.58 01/05/2022   BILITOT <0.2 01/05/2022   ALKPHOS 107 01/05/2022   AST 22 01/05/2022   ALT 30 01/05/2022   PROT 7.4 01/05/2022   ALBUMIN 3.9 01/05/2022   CALCIUM 9.3  01/05/2022   GFRAA 127 05/07/2020   QFTBGOLD Negative 11/11/2015   QFTBGOLDPLUS WILL FOLLOW 01/05/2022    Speciality Comments: No specialty comments available.  Procedures:  Large Joint Inj: bilateral knee on 01/06/2022 3:50 PM Indications: pain and joint swelling Details: 25 G 1.5 in needle, anterior approach  Arthrogram: No  Medications (Right): 3 mL lidocaine 1 %; 40 mg triamcinolone acetonide 40 MG/ML Medications (Left): 3 mL lidocaine 1 %; 40 mg triamcinolone acetonide 40 MG/ML Outcome: tolerated well, no immediate complications Procedure, treatment alternatives, risks and benefits explained, specific risks discussed. Consent was given by the patient. Immediately prior to procedure a time out was called to verify the correct patient, procedure, equipment, support staff and site/side marked as required. Patient was prepped and draped in the usual sterile fashion.     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  Plan to continue the  Enbrel 50 mg subcu weekly.  Since she is off the medication and having significant inflammation we proceeded with bilateral knee articular steroid injection to improve symptoms faster and hopefully benefit will be maintained on treatment.  High risk medication use - Enbrel 50 mg [place] weekly   Recent labs reviewed CBC and CMP QuantiFERON were normal no problems for resuming Enbrel treatment.  Has had some elevated glucose level I think risk to this from local steroid injection is less than oral prednisone tapering or other systemic medication.  Orders: No orders of the defined types were placed in this encounter.  No orders of the defined types were placed in this encounter.    Follow-Up Instructions: Return in about 3 months (around 04/07/2022) for RA on ENB/inj f/u 7mos.   Fuller Plan, MD  Note - This record has been created using [employer].  Chart creation errors have been sought, but may not always  have been located. Such creation errors do not reflect on  the standard of medical care.

## 2022-01-05 ENCOUNTER — Other Ambulatory Visit: Payer: Self-pay

## 2022-01-05 DIAGNOSIS — Z79899 Other long term (current) drug therapy: Secondary | ICD-10-CM

## 2022-01-05 DIAGNOSIS — Z9225 Personal history of immunosupression therapy: Secondary | ICD-10-CM

## 2022-01-05 DIAGNOSIS — Z111 Encounter for screening for respiratory tuberculosis: Secondary | ICD-10-CM

## 2022-01-06 ENCOUNTER — Ambulatory Visit: Payer: Self-pay | Attending: Internal Medicine | Admitting: Internal Medicine

## 2022-01-06 ENCOUNTER — Encounter: Payer: Self-pay | Admitting: Internal Medicine

## 2022-01-06 VITALS — BP 135/83 | HR 102 | Resp 16 | Ht 60.0 in | Wt 168.8 lb

## 2022-01-06 DIAGNOSIS — M05762 Rheumatoid arthritis with rheumatoid factor of left knee without organ or systems involvement: Secondary | ICD-10-CM

## 2022-01-06 DIAGNOSIS — Z79899 Other long term (current) drug therapy: Secondary | ICD-10-CM

## 2022-01-06 DIAGNOSIS — M05761 Rheumatoid arthritis with rheumatoid factor of right knee without organ or systems involvement: Secondary | ICD-10-CM

## 2022-01-07 ENCOUNTER — Encounter: Payer: Self-pay | Admitting: Internal Medicine

## 2022-01-07 LAB — CBC WITH DIFFERENTIAL/PLATELET
Basophils Absolute: 0.1 10*3/uL (ref 0.0–0.2)
Basos: 1 %
EOS (ABSOLUTE): 0.2 10*3/uL (ref 0.0–0.4)
Eos: 2 %
Hematocrit: 35.8 % (ref 34.0–46.6)
Hemoglobin: 11.7 g/dL (ref 11.1–15.9)
Immature Grans (Abs): 0 10*3/uL (ref 0.0–0.1)
Immature Granulocytes: 0 %
Lymphocytes Absolute: 2.2 10*3/uL (ref 0.7–3.1)
Lymphs: 23 %
MCH: 26.7 pg (ref 26.6–33.0)
MCHC: 32.7 g/dL (ref 31.5–35.7)
MCV: 82 fL (ref 79–97)
Monocytes Absolute: 0.6 10*3/uL (ref 0.1–0.9)
Monocytes: 6 %
Neutrophils Absolute: 6.6 10*3/uL (ref 1.4–7.0)
Neutrophils: 68 %
Platelets: 297 10*3/uL (ref 150–450)
RBC: 4.39 x10E6/uL (ref 3.77–5.28)
RDW: 13.7 % (ref 11.7–15.4)
WBC: 9.6 10*3/uL (ref 3.4–10.8)

## 2022-01-07 LAB — CMP14 + ANION GAP
ALT: 30 IU/L (ref 0–32)
AST: 22 IU/L (ref 0–40)
Albumin/Globulin Ratio: 1.1 — ABNORMAL LOW (ref 1.2–2.2)
Albumin: 3.9 g/dL (ref 3.8–4.9)
Alkaline Phosphatase: 107 IU/L (ref 44–121)
Anion Gap: 15 mmol/L (ref 10.0–18.0)
BUN/Creatinine Ratio: 19 (ref 9–23)
BUN: 11 mg/dL (ref 6–24)
Bilirubin Total: <0.2 mg/dL (ref 0.0–1.2)
CO2: 23 mmol/L (ref 20–29)
Calcium: 9.3 mg/dL (ref 8.7–10.2)
Chloride: 101 mmol/L (ref 96–106)
Creatinine, Ser: 0.58 mg/dL (ref 0.57–1.00)
Globulin, Total: 3.5 g/dL (ref 1.5–4.5)
Glucose: 172 mg/dL — ABNORMAL HIGH (ref 70–99)
Potassium: 3.5 mmol/L (ref 3.5–5.2)
Sodium: 139 mmol/L (ref 134–144)
Total Protein: 7.4 g/dL (ref 6.0–8.5)
eGFR: 104 mL/min/{1.73_m2} (ref >59)

## 2022-01-07 LAB — QUANTIFERON-TB GOLD PLUS
QuantiFERON Mitogen Value: 9.31 IU/mL
QuantiFERON Nil Value: 0.52 IU/mL
QuantiFERON TB1 Ag Value: 0.53 IU/mL
QuantiFERON TB2 Ag Value: 0.56 IU/mL
QuantiFERON-TB Gold Plus: NEGATIVE

## 2022-01-07 LAB — SEDIMENTATION RATE: Sed Rate: 78 mm/hr — ABNORMAL HIGH (ref 0–40)

## 2022-01-08 ENCOUNTER — Other Ambulatory Visit (HOSPITAL_COMMUNITY): Payer: Self-pay

## 2022-01-10 MED ORDER — LIDOCAINE HCL 1 % IJ SOLN
3.0000 mL | INTRAMUSCULAR | Status: AC | PRN
Start: 2022-01-06 — End: 2022-01-06
  Administered 2022-01-06: 3 mL

## 2022-01-10 MED ORDER — LIDOCAINE HCL 1 % IJ SOLN
3.0000 mL | INTRAMUSCULAR | Status: AC | PRN
  Administered 2022-01-06: 3 mL

## 2022-01-10 MED ORDER — TRIAMCINOLONE ACETONIDE 40 MG/ML IJ SUSP
40.0000 mg | INTRAMUSCULAR | Status: AC | PRN
  Administered 2022-01-06: 40 mg via INTRA_ARTICULAR

## 2022-01-19 ENCOUNTER — Ambulatory Visit (INDEPENDENT_AMBULATORY_CARE_PROVIDER_SITE_OTHER): Payer: Self-pay | Admitting: Student

## 2022-01-19 VITALS — BP 149/84 | HR 80 | Temp 98.0°F | Ht 60.0 in | Wt 169.3 lb

## 2022-01-19 DIAGNOSIS — K029 Dental caries, unspecified: Secondary | ICD-10-CM

## 2022-01-19 DIAGNOSIS — R7303 Prediabetes: Secondary | ICD-10-CM

## 2022-01-19 DIAGNOSIS — E119 Type 2 diabetes mellitus without complications: Secondary | ICD-10-CM | POA: Insufficient documentation

## 2022-01-19 DIAGNOSIS — Z0000 Encounter for general adult medical examination without abnormal findings: Secondary | ICD-10-CM

## 2022-01-19 DIAGNOSIS — I10 Essential (primary) hypertension: Secondary | ICD-10-CM

## 2022-01-19 NOTE — Assessment & Plan Note (Signed)
Hemoglobin A1c of 6.2 in 2015, with random blood glucose levels as high as 190s throughout the years.  I recommended repeat hemoglobin A1c for this patient, however she has concerns about the cost of the test.  We will defer this for follow-up visit.

## 2022-01-19 NOTE — Patient Instructions (Signed)
Today we discussed blood pressure and testing for diabetes.  We won't make any changes to your medicines today.  However, because your blood pressure is still high, I would like for you to measure your blood pressure at home. Please keep a log of the measurements. We will follow up by telephone in 1 month.  Return to the clinic in 1 month for a follow-up visit by telephone.    Referral Orders         Ambulatory referral to Dentistry     Please call our clinic at [phone] Monday through Friday from 9 am to 4 pm if you have questions or concerns about your health. If after hours or on the weekend, call the main hospital number and ask for the Internal Medicine Resident On-Call. If you need medication refills, please notify your pharmacy one week in advance and they will send [place] a request.   Best, Nani Gasser, [place]

## 2022-01-19 NOTE — Assessment & Plan Note (Signed)
Patient is hesitant to undergo testing at this point because of the charges she has received.  Its not clear to me whether she is covered with our orange card or if she is covered with [employer].  At any rate she wishes to defer blood tests.

## 2022-01-19 NOTE — Assessment & Plan Note (Signed)
Blood pressure remains elevated today at 149/84, checked twice.  She feels well otherwise.  She is very hesitant to adjust her blood pressure medication.  She is bothered by ankle swelling, attributable to amlodipine.  She has not tolerated chlorthalidone in the past.  On exam she is well-appearing.  This patient, 61 years old, with stage II hypertension poorly controlled on 2 drug regimen of amlodipine and losartan.  At this point she is not amenable to more aggressive therapy.  She also declines routine blood work today. - Continue amlodipine 5 mg - Continue losartan 50 mg

## 2022-01-19 NOTE — Progress Notes (Signed)
Subjective:  Reason for visit: Follow-up for hypertension  HPI:  [name] is a 61 y.o. female with history of hypertension who presents for follow-up of same. Please see problem based assessment and plan for additional details.  Past Medical History:  Diagnosis Date   H/O pinworm infection    Hypertension 2012   Rheumatoid arthritis ([place]) 2017    Current Outpatient Medications on File Prior to Visit  Medication Sig Dispense Refill   amLODipine (NORVASC) 5 MG tablet Take 1 tablet (5 mg total) by mouth daily. 30 tablet 11   losartan (COZAAR) 50 MG tablet Take 1 tablet (50 mg total) by mouth daily. 90 tablet 1   Calcium-Magnesium-Vitamin D (CALCIUM MAGNESIUM PO) Take by mouth.     Etanercept (ENBREL MINI) 50 MG/ML SOCT Inject 50 mg into the skin once a week. 12 mL 0   Omega-3 Fatty Acids (FISH OIL PO) Take by mouth. (Patient not taking: Reported on 03/23/2021)     triamcinolone ointment (KENALOG) 0.1 % Apply to affected areas twice daily as needed 80 g 3   VITAMIN D PO Take by mouth.     No current facility-administered medications on file prior to visit.    Family History  Problem Relation Age of Onset   Heart disease Mother    Hyperlipidemia Mother    Hypertension Mother    Heart disease Father    Hypertension Father    Diabetes Father    Healthy Brother    Healthy Brother    Healthy Son    Healthy Son     Social History   Socioeconomic History   Marital status: Married    Spouse name: Not on file   Number of children: 2   Years of education: 14   Highest education level: Some college, no degree  Occupational History   Occupation: Psychologist, clinical  Tobacco Use   Smoking status: Never   Smokeless tobacco: Never  Vaping Use   Vaping Use: Never used  Substance and Sexual Activity   Alcohol use: No    Alcohol/week: 0.0 standard drinks of alcohol   Drug use: No   Sexual activity: Not on file    Comment: has not had sex for  several years  Other Topics Concern   Not on file  Social History Narrative   Originally from    [place]   Has lived in [place]. Since 1999   2 young adult sons   Husband was a professor at [employer], but imprisoned for illegal internet activity.  Patient states he was unfairly targeted.  Tried to work with Leanne Chang to have his conviction overturned.  He was deported after release in recent years.   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   Social Determinants of Health   Financial Resource Strain: Not on file  Food Insecurity: Not on file  Transportation Needs: Not on file  Physical Activity: Not on file  Stress: Not on file  Social Connections: Not on file  Intimate Partner Violence: Not on file    Review of Systems: ROS negative except for what is noted on the assessment and plan.  Objective:   Vitals:   01/19/22 1549  BP: (!) 149/84  Pulse: 80  Temp: 98 F (36.7 C)  TempSrc: Oral  SpO2: 100%  Weight: 169 lb 4.8 oz (76.8 kg)  Height: 5' (1.524 m)    Physical Exam Constitutional:  General: She is not in acute distress.    Appearance: Normal appearance.  Cardiovascular:     Rate and Rhythm: Normal rate and regular rhythm.     Pulses: Normal pulses.  Pulmonary:     Effort: Pulmonary effort is normal.     Breath sounds: Normal breath sounds. No stridor.  Abdominal:     Palpations: Abdomen is soft.     Tenderness: There is no abdominal tenderness.  Musculoskeletal:     Right lower leg: No edema.     Left lower leg: No edema.  Skin:    General: Skin is warm and dry.  Neurological:     Mental Status: She is alert. Mental status is at baseline.  Psychiatric:        Mood and Affect: Mood normal.        Behavior: Behavior normal.       Assessment & Plan:  HTN (hypertension) Blood pressure remains elevated today at 149/84, checked twice.  She feels well otherwise.  She is very hesitant to adjust her blood pressure medication.  She is bothered by  ankle swelling, attributable to amlodipine.  She has not tolerated chlorthalidone in the past.  On exam she is well-appearing.  This patient, 61 years old, with stage II hypertension poorly controlled on 2 drug regimen of amlodipine and losartan.  At this point she is not amenable to more aggressive therapy.  She also declines routine blood work today. - Continue amlodipine 5 mg - Continue losartan 50 mg  Prediabetes Hemoglobin A1c of 6.2 in 2015, with random blood glucose levels as high as 190s throughout the years.  I recommended repeat hemoglobin A1c for this patient, however she has concerns about the cost of the test.  We will defer this for follow-up visit.  Healthcare maintenance Patient is hesitant to undergo testing at this point because of the charges she has received.  Its not clear to me whether she is covered with our orange card or if she is covered with [employer]FA.  At any rate she wishes to defer blood tests.  Tooth decay Complains of dislodged crown and decayed tooth.  One of her right lower molars.  Not painful.  On exam the tooth is broken but there are not any signs of local infection.  Patient is requesting referral to dentistry for this problem.   Follow up in 3 months for hypertension and prediabetes.  Patient seen with Dr. Loree Fee, M.D. [facility]  PGY-1 Pager: [phone] Date 01/19/2022  Time 9:51 PM

## 2022-01-19 NOTE — Assessment & Plan Note (Signed)
Complains of dislodged crown and decayed tooth.  One of her right lower molars.  Not painful.  On exam the tooth is broken but there are not any signs of local infection.  Patient is requesting referral to dentistry for this problem.

## 2022-01-21 NOTE — Progress Notes (Signed)
Internal Medicine Clinic Attending  I saw and evaluated the patient.  I personally confirmed the key portions of the history and exam documented by Dr. McLendon and I reviewed pertinent patient test results.  The assessment, diagnosis, and plan were formulated together and I agree with the documentation in the resident's note.  

## 2022-02-08 ENCOUNTER — Other Ambulatory Visit: Payer: Self-pay

## 2022-02-09 ENCOUNTER — Other Ambulatory Visit (HOSPITAL_COMMUNITY): Payer: Self-pay

## 2022-02-10 ENCOUNTER — Other Ambulatory Visit (HOSPITAL_COMMUNITY): Payer: Self-pay

## 2022-02-12 ENCOUNTER — Other Ambulatory Visit (HOSPITAL_COMMUNITY): Payer: Self-pay

## 2022-02-12 ENCOUNTER — Other Ambulatory Visit: Payer: Self-pay | Admitting: Internal Medicine

## 2022-02-12 DIAGNOSIS — I10 Essential (primary) hypertension: Secondary | ICD-10-CM

## 2022-02-14 ENCOUNTER — Other Ambulatory Visit: Payer: Self-pay | Admitting: Student

## 2022-02-14 DIAGNOSIS — I10 Essential (primary) hypertension: Secondary | ICD-10-CM

## 2022-02-14 MED ORDER — AMLODIPINE BESYLATE 5 MG PO TABS
5.0000 mg | ORAL_TABLET | Freq: Every day | ORAL | 3 refills | Status: DC
  Filled 2022-02-14 – 2022-03-14 (×2): qty 30, 30d supply, fill #0
  Filled 2022-04-20: qty 30, 30d supply, fill #1
  Filled 2022-05-18 (×2): qty 30, 30d supply, fill #2
  Filled 2022-06-19: qty 30, 30d supply, fill #3

## 2022-02-14 MED ORDER — LOSARTAN POTASSIUM 50 MG PO TABS
50.0000 mg | ORAL_TABLET | Freq: Every day | ORAL | 1 refills | Status: DC
  Filled 2022-02-14: qty 30, 30d supply, fill #0
  Filled 2022-03-14: qty 30, 30d supply, fill #1
  Filled 2022-04-20: qty 30, 30d supply, fill #2
  Filled 2022-05-18 (×2): qty 30, 30d supply, fill #3
  Filled 2022-06-19: qty 30, 30d supply, fill #4
  Filled 2022-07-19: qty 30, 30d supply, fill #5

## 2022-02-14 NOTE — Progress Notes (Signed)
Vicki Cook called the after hours number as she has ran out of her blood pressure medications and would like a refill. Have sent in refills for her amlodipine and losartan to [facility] with refills.

## 2022-02-15 ENCOUNTER — Other Ambulatory Visit (HOSPITAL_COMMUNITY): Payer: Self-pay

## 2022-02-26 ENCOUNTER — Other Ambulatory Visit: Payer: Self-pay

## 2022-02-26 ENCOUNTER — Other Ambulatory Visit (HOSPITAL_COMMUNITY): Payer: Self-pay

## 2022-02-26 DIAGNOSIS — M05761 Rheumatoid arthritis with rheumatoid factor of right knee without organ or systems involvement: Secondary | ICD-10-CM

## 2022-02-26 MED ORDER — ENBREL MINI 50 MG/ML ~~LOC~~ SOCT: 50.0000 mg | SUBCUTANEOUS | 0 refills | Status: DC

## 2022-02-26 MED ORDER — ENBREL MINI 50 MG/ML ~~LOC~~ SOCT
50.0000 mg | SUBCUTANEOUS | 0 refills | Status: DC
  Filled 2022-02-26: qty 12, fill #0

## 2022-02-26 NOTE — Telephone Encounter (Signed)
Patient called stating that she is in need of an Enbrel refill.  Next Visit: 04/07/2022  Last Visit: 01/06/2022  Last Fill: 12/02/2021  [id number] arthritis involving both knees with positive rheumatoid factor   Current Dose per office note 01/06/2022: Enbrel 50 mg [place] weekly  Labs: 01/05/2022 CMP Glucose 172 CBC WNL  TB Gold: 01/05/2022 Neg  Okay to refill Enbrel?

## 2022-03-01 ENCOUNTER — Ambulatory Visit (INDEPENDENT_AMBULATORY_CARE_PROVIDER_SITE_OTHER): Payer: Self-pay | Admitting: Family Medicine

## 2022-03-01 VITALS — BP 124/92 | Ht 60.0 in | Wt 169.0 lb

## 2022-03-01 DIAGNOSIS — L84 Corns and callosities: Secondary | ICD-10-CM

## 2022-03-01 DIAGNOSIS — S97122A Crushing injury of left lesser toe(s), initial encounter: Secondary | ICD-10-CM

## 2022-03-01 DIAGNOSIS — S4991XA Unspecified injury of right shoulder and upper arm, initial encounter: Secondary | ICD-10-CM

## 2022-03-01 NOTE — Patient Instructions (Signed)
You have a small nondisplaced fracture of the middle phalanx of your pinky toe. This will heal over the next 3-4 weeks. Icing if needed 15 minutes at a time. Tylenol as needed for pain. We will refer you to podiatry for the callus that you have - try to keep the nail on the 5th toe cut down a little more so it doesn't rub here. If your shoulder isn't improving over the next 2 weeks call me otherwise follow up as needed.

## 2022-03-02 ENCOUNTER — Encounter: Payer: Self-pay | Admitting: Family Medicine

## 2022-03-02 NOTE — Progress Notes (Signed)
PCP: Romana Juniper, MD  Subjective:   HPI: Patient is a 61 y.o. female here for left toe injury.  Patient reports about a week ago she was cleaning up after her cat when she slipped and fell down a few stairs injuring her left 5th toe. Swelling, possible bruising. No prior injury to this though she does have callus on 4th toe that she is interested in having evaluated/treated. She did fall and strike lateral right shoulder on the ground, soreness here as well but no bruising and motion has been good.  Past Medical History:  Diagnosis Date   H/O pinworm infection    Hypertension 2012   Rheumatoid arthritis ([place]) 2017    Current Outpatient Medications on File Prior to Visit  Medication Sig Dispense Refill   amLODipine (NORVASC) 5 MG tablet Take 1 tablet (5 mg total) by mouth daily. 30 tablet 3   Calcium-Magnesium-Vitamin D (CALCIUM MAGNESIUM PO) Take by mouth.     Etanercept (ENBREL MINI) 50 MG/ML SOCT Inject 50 mg into the skin once a week. 12 mL 0   losartan (COZAAR) 50 MG tablet Take 1 tablet (50 mg total) by mouth daily. 90 tablet 1   Omega-3 Fatty Acids (FISH OIL PO) Take by mouth. (Patient not taking: Reported on 03/23/2021)     triamcinolone ointment (KENALOG) 0.1 % Apply to affected areas twice daily as needed 80 g 3   VITAMIN D PO Take by mouth.     No current facility-administered medications on file prior to visit.    History reviewed. No pertinent surgical history.  Allergies  Allergen Reactions   Asa [Aspirin] Nausea And Vomiting and Other (See Comments)    Burning in stomach    Lisinopril Other (See Comments)    Had pain in her knees     BP (!) 124/92   Ht 5' (1.524 m)   Wt 169 lb (76.7 kg)   BMI 33.01 kg/m       No data to display              No data to display              Objective:  Physical Exam:  Gen: NAD, comfortable in exam room  Left foot/ankle: Swelling throughout 5th digit.  Callus lateral aspect 4th digit.  No  other gross deformity, ecchymoses FROM ankle TTP throughout 5th digit NV intact distally.  Right shoulder: No swelling, ecchymoses.  No gross deformity. No TTP AC joint, biceps tendon. Lacks about 15 degrees flexion and abduction but equal on left side. Negative Hawkins, Neers. Negative Yergasons. Strength 5/5 with empty can and resisted internal/external rotation. Negative apprehension. NV intact distally.  Limited MSK u/s left 5th digit: cortical irregularity distal aspect middle phalanx consistent with nondisplaced fracture.  Assessment & Plan:  1. Left 5th digit injury - nondisplaced fracture middle phalanx.  No malrotation or angulation either.  Reassured, expect 3-4 weeks to heal.  Icing, tylenol if needed.  2. Right shoulder injury - 2/2 contusion.  Follow up in 2 weeks if not improving.  Rotator cuff testing normal.  3. Left 4th digit callus - she would like to see podiatry so referral placed.

## 2022-03-11 ENCOUNTER — Ambulatory Visit: Payer: No Typology Code available for payment source | Admitting: Podiatry

## 2022-03-15 ENCOUNTER — Other Ambulatory Visit (HOSPITAL_COMMUNITY): Payer: Self-pay

## 2022-03-16 ENCOUNTER — Other Ambulatory Visit: Payer: Self-pay | Admitting: Podiatry

## 2022-03-16 ENCOUNTER — Ambulatory Visit (INDEPENDENT_AMBULATORY_CARE_PROVIDER_SITE_OTHER): Payer: No Typology Code available for payment source | Admitting: Podiatry

## 2022-03-16 DIAGNOSIS — M79671 Pain in right foot: Secondary | ICD-10-CM

## 2022-03-16 DIAGNOSIS — B351 Tinea unguium: Secondary | ICD-10-CM

## 2022-03-16 DIAGNOSIS — M79672 Pain in left foot: Secondary | ICD-10-CM

## 2022-03-16 DIAGNOSIS — M2042 Other hammer toe(s) (acquired), left foot: Secondary | ICD-10-CM

## 2022-03-16 DIAGNOSIS — L84 Corns and callosities: Secondary | ICD-10-CM

## 2022-03-16 NOTE — Patient Instructions (Signed)
Look for urea 40% + 2% salicylic acid  cream or ointment and apply to the thickened dry skin / calluses. This can be bought over the counter, at a pharmacy or online such as [employer].  

## 2022-03-18 NOTE — Progress Notes (Signed)
  Subjective:  Patient ID: Vicki Cook, female    DOB: [birth year],  MRN: [id number]  Chief Complaint  Patient presents with   Nail Problem    (np) routine foot care-callus shaving [facility] discount   Callouses    61 y.o. female presents with the above complaint. History confirmed with patient.  She has discolored thickened nails that she would like to know if there is fungus in them.  She also has multiple calluses.  Objective:  Physical Exam: warm, good capillary refill, no trophic changes or ulcerative lesions, normal DP and PT pulses, normal sensory exam, and discoloration of left hallux nail, there is hyperkeratosis on the right submetatarsal 5, left medial hallux, left fourth toe laterally.  She has tailor's bunion on the left that is asymptomatic  Assessment:   1. Callus of foot   2. Onychomycosis   3. Hammertoe of left foot      Plan:  Patient was evaluated and treated and all questions answered.  All symptomatic hyperkeratoses were safely debrided with a sterile #15 blade to patient's level of comfort without incident. We discussed preventative and palliative care of these lesions including supportive and accommodative shoegear, padding, prefabricated and custom molded accommodative orthoses, use of a pumice stone and lotions/creams daily.  Discussed how calluses formed from the hammertoe deformities as well.  We discussed if this did not improve surgical treatment may be helpful.  Silicone pads were dispensed to offload the lesions.  Regarding the hallux discoloration I recommended culture of the nail plate.  Nail sample was taken from the left hallux and sent for nail culture.  I will let her know the results of culture and we will decide treatment from there.  Return if symptoms worsen or fail to improve.

## 2022-04-07 ENCOUNTER — Ambulatory Visit: Payer: Self-pay | Attending: Internal Medicine | Admitting: Internal Medicine

## 2022-04-07 ENCOUNTER — Encounter: Payer: Self-pay | Admitting: Internal Medicine

## 2022-04-07 VITALS — BP 142/91 | HR 90 | Resp 15 | Ht 60.0 in | Wt 159.0 lb

## 2022-04-07 DIAGNOSIS — M05762 Rheumatoid arthritis with rheumatoid factor of left knee without organ or systems involvement: Secondary | ICD-10-CM

## 2022-04-07 DIAGNOSIS — Z79899 Other long term (current) drug therapy: Secondary | ICD-10-CM

## 2022-04-07 DIAGNOSIS — M05761 Rheumatoid arthritis with rheumatoid factor of right knee without organ or systems involvement: Secondary | ICD-10-CM

## 2022-04-07 NOTE — Progress Notes (Unsigned)
Office Visit Note  Patient: Vicki Cook             Date of Birth: 08-19-1960           MRN: [id number]             PCP: Morene Crocker, MD Referring: Morene Crocker,* Visit Date: 04/07/2022   Subjective:  Follow-up (Doing good)   History of Present Illness: Vicki Cook is a 61 y.o. female here for follow up for seropositive RA on Enbrel 50 mg [place] weekly. Bilateral knee steroid injection at last visit due to inflammation and pain.***   Elbows limited Wrists limited Mcp wenning and tenderness Right knee swelling and tenderness Take ssaturday worse by friday ***  Previous HPI 01/06/22 Vicki Cook is a 61 y.o. female here for follow up for seropositive RA on Enbrel 50 mg [place] weekly. She was unable to get her regular labs drawn awaiting renewal of orange card for this year. She did restart the Enbrel since about 2 weeks ago but was off this for 2 months and had a lot of increased pain and swelling. Including finger swelling which she did not have before.   Previous HPI 06/15/2021 Vicki Cook is a 61 y.o. female here for follow up for seropositive RA on Enbrel 50 mg [place] weekly. She feels a good benefit although left knee pain and swelling is persistent. A small amount of elbow and wrist pain but no effusions like in her knee. She hs not suffered any serious infections or other complications.    Previous HPI 03/23/21 Vicki Cook is a 61 y.o. female here for follow up for seropositive RA on Enbrel 50 mg [place] weekly. She feels that symptoms are partially improved on the Enbrel. Hands and wrists are significantly improved. She continues having bilateral knee swelling especially on right side. She takes occasional tylenol for symptoms. She has gained at least 5 pounds over the past few months despite trying to stay active.   Previous HPI: 09/30/20 Vicki Cook is a 61 y.o. female here for evaluation with bilateral shoulder pain and history of  rheumatoid arthritis and concern for possible PMR. She was previously seen with sports medicine with shoulder ultrasound consistent with inflammatory changes and treated with intraarticular injection. She was previously evaluated for rheumatoid arthritis and recommended to start methotrexate apparently taking the medicine less than a month. Recent labs with significant inflammatory markers and having some jaw pain so concern of GCA and started prednisone taper since January but never saw vascular surgery for biopsy due to cost issues.   Review of Systems  Constitutional:  Positive for fatigue.  HENT:  Positive for mouth dryness. Negative for mouth sores.   Eyes:  Negative for dryness.  Respiratory:  Negative for shortness of breath.   Cardiovascular:  Negative for chest pain and palpitations.  Gastrointestinal:  Negative for blood in stool, constipation and diarrhea.  Endocrine: Negative for increased urination.  Genitourinary:  Negative for involuntary urination.  Musculoskeletal:  Positive for joint pain, joint pain, joint swelling, myalgias, morning stiffness and myalgias. Negative for gait problem, muscle weakness and muscle tenderness.  Skin:  Positive for color change. Negative for rash, hair loss and sensitivity to sunlight.  Allergic/Immunologic: Negative for susceptible to infections.  Neurological:  Negative for dizziness and headaches.  Hematological:  Negative for swollen glands.  Psychiatric/Behavioral:  Negative for depressed mood and sleep disturbance. The patient is not nervous/anxious.     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   Eczema 12/11/2018   Rheumatoid arthritis (HCC) 05/04/2015    Cough 07/22/2014   Panic attack 07/19/2014   Microcytic anemia 05/02/2014   Bilateral knee pain 03/20/2014   Healthcare maintenance 02/25/2014   HTN (hypertension) 02/07/2014    Past Medical History:  Diagnosis Date   H/O pinworm infection    Hypertension 2012   Rheumatoid arthritis (HCC) 2017    Family History  Problem Relation Age of Onset   Heart disease Mother    Hyperlipidemia Mother    Hypertension Mother    Heart disease Father    Hypertension Father    Diabetes Father    Healthy Brother    Healthy Brother    Healthy Son    Healthy Son    History reviewed. No pertinent surgical history. Social History   Social History Narrative   Originally from    [place]   Has lived in [place]. [place] 1999   2 young adult sons   Husband was a professor at [employer], but imprisoned for illegal internet activity.  Patient states he was unfairly targeted.  Tried to work with Suzie Portela to have his conviction overturned.  He was deported after release in recent years.   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   Immunization History  Administered Date(s) Administered   Influenza,inj,Quad PF,6+ Mos 02/25/2014, 02/19/2015, 03/24/2021   PFIZER(Purple Top)SARS-COV-2 Vaccination 07/27/2019, 08/20/2019   Pneumococcal Conjugate-13 09/30/2020     Objective: Vital Signs: BP (!) 142/91 (BP Location: Left Arm, Patient Position: Sitting, Cuff Size: Normal)   Pulse 90   Resp 15   Ht 5' (1.524 m)   Wt 159 lb (72.1 kg)   BMI 31.05 kg/m    Physical Exam   Musculoskeletal Exam: ***  CDAI Exam: CDAI Score: -- Patient Global: --; Provider Global: -- Swollen: --; Tender: -- Joint Exam 04/07/2022   No joint exam has been documented for this visit   There is currently no information documented on the homunculus. Go to the Rheumatology activity and complete the homunculus joint exam.  Investigation: No additional findings.  Imaging: No results found.  Recent Labs: Lab Results   Component Value Date   WBC 9.6 01/05/2022   HGB 11.7 01/05/2022   PLT 297 01/05/2022   NA 139 01/05/2022   K 3.5 01/05/2022   CL 101 01/05/2022   CO2 23 01/05/2022   GLUCOSE 172 (H) 01/05/2022   BUN 11 01/05/2022   CREATININE 0.58 01/05/2022   BILITOT <0.2 01/05/2022   ALKPHOS 107 01/05/2022   AST 22 01/05/2022   ALT 30 01/05/2022   PROT 7.4 01/05/2022   ALBUMIN 3.9 01/05/2022   CALCIUM 9.3 01/05/2022   GFRAA 127 05/07/2020   QFTBGOLD Negative 11/11/2015   QFTBGOLDPLUS Negative 01/05/2022    Speciality Comments: No specialty comments available.  Procedures:  No procedures performed Allergies: Asa [aspirin] and Lisinopril   Assessment / Plan:     Visit Diagnoses: No diagnosis found.  ***  Orders: No orders of the defined types were placed in this encounter.  No orders of the defined types were placed in this encounter.    Follow-Up Instructions: No follow-ups on file.  Collier Salina, MD  Note - This record has been created using [employer].  Chart creation errors have been sought, but may not always  have been located. Such creation errors do not reflect on  the standard of medical care.

## 2022-04-08 ENCOUNTER — Other Ambulatory Visit: Payer: Self-pay

## 2022-04-08 ENCOUNTER — Telehealth: Payer: Self-pay | Admitting: Pharmacist

## 2022-04-08 DIAGNOSIS — M05761 Rheumatoid arthritis with rheumatoid factor of right knee without organ or systems involvement: Secondary | ICD-10-CM

## 2022-04-08 DIAGNOSIS — R7303 Prediabetes: Secondary | ICD-10-CM

## 2022-04-08 DIAGNOSIS — Z79899 Other long term (current) drug therapy: Secondary | ICD-10-CM

## 2022-04-08 NOTE — Progress Notes (Signed)
Will start Humira BIV in telephone encounter.  Chesley Mires, PharmD, MPH, BCPS, CPP Clinical Pharmacist (Rheumatology and Pulmonology)

## 2022-04-08 NOTE — Addendum Note (Signed)
Addended by: Erlinda Hong T on: 04/08/2022 04:27 PM   Modules accepted: Orders

## 2022-04-08 NOTE — Telephone Encounter (Addendum)
Called patient regarding [employer] application that will need to be completed as she is unisunred. She will stop by clinic today to sign. Placed in file cabinet up front.   pRovider form placed with Dr. Dimple Casey.  Chesley Mires, PharmD, MPH, BCPS, CPP Clinical Pharmacist (Rheumatology and Pulmonology)  ----- Message from Fuller Plan, MD sent at 04/08/2022  9:56 AM EST ----- I recommend switching to Humira for seropositive RA due to secondary loss of response on Enbrel since last year. Will need to go through patient assistance, I think all baseline labs already documented. Thanks.

## 2022-04-08 NOTE — Telephone Encounter (Signed)
Received signed provider form for Humira PAP from Dr. Dimple Casey. Pending patient portion  Chesley Mires, PharmD, MPH, BCPS, CPP Clinical Pharmacist (Rheumatology and Pulmonology)

## 2022-04-09 LAB — CMP14 + ANION GAP
ALT: 18 IU/L (ref 0–32)
AST: 21 IU/L (ref 0–40)
Albumin/Globulin Ratio: 0.9 — ABNORMAL LOW (ref 1.2–2.2)
Albumin: 3.8 g/dL (ref 3.8–4.9)
Alkaline Phosphatase: 99 IU/L (ref 44–121)
Anion Gap: 17 mmol/L (ref 10.0–18.0)
BUN/Creatinine Ratio: 27 — ABNORMAL HIGH (ref 9–23)
BUN: 14 mg/dL (ref 6–24)
Bilirubin Total: 0.2 mg/dL (ref 0.0–1.2)
CO2: 21 mmol/L (ref 20–29)
Calcium: 9.2 mg/dL (ref 8.7–10.2)
Chloride: 100 mmol/L (ref 96–106)
Creatinine, Ser: 0.52 mg/dL — ABNORMAL LOW (ref 0.57–1.00)
Globulin, Total: 4.4 g/dL (ref 1.5–4.5)
Glucose: 78 mg/dL (ref 70–99)
Potassium: 3.7 mmol/L (ref 3.5–5.2)
Sodium: 138 mmol/L (ref 134–144)
Total Protein: 8.2 g/dL (ref 6.0–8.5)
eGFR: 107 mL/min/{1.73_m2} (ref >59)

## 2022-04-09 LAB — HEMOGLOBIN A1C
Est. average glucose Bld gHb Est-mCnc: 140 mg/dL
Hgb A1c MFr Bld: 6.5 % — ABNORMAL HIGH (ref 4.8–5.6)

## 2022-04-09 LAB — SEDIMENTATION RATE: Sed Rate: 74 mm/hr — ABNORMAL HIGH (ref 0–40)

## 2022-04-11 LAB — FUNGUS CULTURE W SMEAR

## 2022-04-11 LAB — SPECIMEN STATUS REPORT

## 2022-04-12 ENCOUNTER — Telehealth: Payer: Self-pay | Admitting: *Deleted

## 2022-04-12 DIAGNOSIS — M05761 Rheumatoid arthritis with rheumatoid factor of right knee without organ or systems involvement: Secondary | ICD-10-CM

## 2022-04-12 LAB — CBC WITH DIFFERENTIAL/PLATELET

## 2022-04-12 NOTE — Telephone Encounter (Signed)
Signed patient portion of application received. Submitted Patient Assistance Application to [employer] for HUMIRA along with provider portion, patient portion, and med list. Will update patient when we receive a response.  Fax# [phone] Phone# [phone]

## 2022-04-12 NOTE — Telephone Encounter (Signed)
Called Vicki Cook 04-12-2022 at 10:46.  DOB verified.  I explained to Vicki Cook that the CBC Diff collected on 04-08-2022 was not completed by [facility].  The CBC was overlooked for analysis.  Recollection is needed.  Patient understands and agreed to return for a Lab Only visit as transportation allows this week.  A future CBC Diff order will be created.  Original order per Dr Shan Levans, PBT 04-12-2022  10:53

## 2022-04-13 ENCOUNTER — Other Ambulatory Visit (INDEPENDENT_AMBULATORY_CARE_PROVIDER_SITE_OTHER): Payer: Self-pay

## 2022-04-13 DIAGNOSIS — M05761 Rheumatoid arthritis with rheumatoid factor of right knee without organ or systems involvement: Secondary | ICD-10-CM

## 2022-04-13 DIAGNOSIS — M05762 Rheumatoid arthritis with rheumatoid factor of left knee without organ or systems involvement: Secondary | ICD-10-CM

## 2022-04-14 LAB — CBC WITH DIFFERENTIAL/PLATELET
Basophils Absolute: 0 10*3/uL (ref 0.0–0.2)
Basos: 1 %
EOS (ABSOLUTE): 0.1 10*3/uL (ref 0.0–0.4)
Eos: 2 %
Hematocrit: 34.9 % (ref 34.0–46.6)
Hemoglobin: 11.4 g/dL (ref 11.1–15.9)
Immature Grans (Abs): 0 10*3/uL (ref 0.0–0.1)
Immature Granulocytes: 0 %
Lymphocytes Absolute: 2.2 10*3/uL (ref 0.7–3.1)
Lymphs: 31 %
MCH: 26.1 pg — ABNORMAL LOW (ref 26.6–33.0)
MCHC: 32.7 g/dL (ref 31.5–35.7)
MCV: 80 fL (ref 79–97)
Monocytes Absolute: 0.5 10*3/uL (ref 0.1–0.9)
Monocytes: 8 %
Neutrophils Absolute: 4.1 10*3/uL (ref 1.4–7.0)
Neutrophils: 58 %
Platelets: 381 10*3/uL (ref 150–450)
RBC: 4.37 x10E6/uL (ref 3.77–5.28)
RDW: 13.2 % (ref 11.7–15.4)
WBC: 7 10*3/uL (ref 3.4–10.8)

## 2022-04-19 NOTE — Progress Notes (Signed)
CBC looks fine, no issue for starting Humira as planned.

## 2022-04-19 NOTE — Progress Notes (Deleted)
Subjective:  Reason for visit: ***  HPI:  [name] is a 61 y.o. female with ***. Please see problem based assessment and plan for additional details.  Past Medical History:  Diagnosis Date   H/O pinworm infection    Hypertension 2012   Rheumatoid arthritis ([place]) 2017    Current Outpatient Medications on File Prior to Visit  Medication Sig Dispense Refill   amLODipine (NORVASC) 5 MG tablet Take 1 tablet (5 mg total) by mouth daily. 30 tablet 3   Calcium-Magnesium-Vitamin D (CALCIUM MAGNESIUM PO) Take by mouth.     Etanercept (ENBREL MINI) 50 MG/ML SOCT Inject 50 mg into the skin once a week. 12 mL 0   losartan (COZAAR) 50 MG tablet Take 1 tablet (50 mg total) by mouth daily. 90 tablet 1   Omega-3 Fatty Acids (FISH OIL PO) Take by mouth. (Patient not taking: Reported on 03/23/2021)     triamcinolone ointment (KENALOG) 0.1 % Apply to affected areas twice daily as needed 80 g 3   VITAMIN D PO Take by mouth.     No current facility-administered medications on file prior to visit.    No past surgical history on file.  Family History  Problem Relation Age of Onset   Heart disease Mother    Hyperlipidemia Mother    Hypertension Mother    Heart disease Father    Hypertension Father    Diabetes Father    Healthy Brother    Healthy Brother    Healthy Son    Healthy Son     Social History   Socioeconomic History   Marital status: Married    Spouse name: Not on file   Number of children: 2   Years of education: 14   Highest education level: Some college, no degree  Occupational History   Occupation: Psychologist, clinical  Tobacco Use   Smoking status: Never    Passive exposure: Never   Smokeless tobacco: Never  Vaping Use   Vaping Use: Never used  Substance and Sexual Activity   Alcohol use: No    Alcohol/week: 0.0 standard drinks of alcohol   Drug use: No   Sexual activity: Not on file    Comment: has not had sex for several years   Other Topics Concern   Not on file  Social History Narrative   Originally from    [place]   Has lived in [place]. Since 1999   2 young adult sons   Husband was a professor at [employer], but imprisoned for illegal internet activity.  Patient states he was unfairly targeted.  Tried to work with Leanne Chang to have his conviction overturned.  He was deported after release in recent years.   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   Social Determinants of Health   Financial Resource Strain: Not on file  Food Insecurity: Not on file  Transportation Needs: Not on file  Physical Activity: Not on file  Stress: Not on file  Social Connections: Not on file  Intimate Partner Violence: Not on file    Review of Systems: ROS negative except for what is noted on the assessment and plan.  Objective:  There were no vitals filed for this visit.  Physical Exam  ***  Assessment & Plan:  No problem-specific Assessment & Plan notes found for this encounter.    No follow-ups on file.  Patient {GC/GE:[id number]::"discussed with","seen with"} Dr. LF:[id number]. Hoffman","Mullen","Narendra","Machen","Vincent","Guilloud","Lau"}  Nani Gasser  MD 04/19/2022, 4:51 PM  Pager: [phone]

## 2022-04-20 NOTE — Telephone Encounter (Signed)
Received a fax from  [employer] regarding an approval for HUMIRA patient assistance from 04/19/22 to 04/20/2023. Approval letter sent to scan center.  Phone number: [phone]  Per letter, prescription for Humira is still being processed and once ready, they will call pt to schedule shipment. Patient can be scheduled for Humira new start appt if she is interested  Chesley Mires, PharmD, MPH, BCPS, CPP Clinical Pharmacist (Rheumatology and Pulmonology)

## 2022-04-21 ENCOUNTER — Other Ambulatory Visit: Payer: Self-pay

## 2022-04-22 ENCOUNTER — Encounter: Payer: Self-pay | Admitting: Student

## 2022-04-22 DIAGNOSIS — R7303 Prediabetes: Secondary | ICD-10-CM

## 2022-04-29 NOTE — Telephone Encounter (Signed)
ATC patient regarding Humira approval. Unable to reach. Left VM requesting return call. Since Humira is different injector than Enbrel, she may be interested in new start visit in the clinic.  Chesley Mires, PharmD, MPH, BCPS, CPP Clinical Pharmacist (Rheumatology and Pulmonology)

## 2022-05-05 NOTE — Telephone Encounter (Signed)
Patient states she is receiving Humira shipment on 05/11/2022. Patient will take Enbrel on 05/08/2022 and plan to hold her Enbrel dose on 05/15/2022.  Scheduled for Humira new start on 05/17/2022.  Knox Saliva, PharmD, MPH, BCPS, CPP Clinical Pharmacist (Rheumatology and Pulmonology)

## 2022-05-10 ENCOUNTER — Encounter: Payer: Self-pay | Admitting: Student

## 2022-05-17 ENCOUNTER — Ambulatory Visit: Payer: Self-pay | Admitting: Pharmacist

## 2022-05-17 NOTE — Progress Notes (Deleted)
Pharmacy Note  Subjective:   Patient presents to clinic today to receive *** of ***. She is transitioning from Enbrel monothreapy. Her last Enbrel dose was on  Patient running a fever or have signs/symptoms of infection? {yes/no:20286}  Patient currently on antibiotics for the treatment of infection? {yes/no:20286}  Patient have any upcoming invasive procedures/surgeries? {yes/no:20286}  Objective: CMP     Component Value Date/Time   NA 138 04/08/2022 1611   K 3.7 04/08/2022 1611   CL 100 04/08/2022 1611   CO2 21 04/08/2022 1611   GLUCOSE 78 04/08/2022 1611   GLUCOSE 153 (H) 02/20/2020 1446   BUN 14 04/08/2022 1611   CREATININE 0.52 (L) 04/08/2022 1611   CREATININE 0.50 05/01/2014 1213   CALCIUM 9.2 04/08/2022 1611   PROT 8.2 04/08/2022 1611   ALBUMIN 3.8 04/08/2022 1611   AST 21 04/08/2022 1611   ALT 18 04/08/2022 1611   ALKPHOS 99 04/08/2022 1611   BILITOT 0.2 04/08/2022 1611   GFRNONAA 110 05/07/2020 1641   GFRNONAA >60 02/20/2020 1446   GFRNONAA >89 05/01/2014 1213   GFRAA 127 05/07/2020 1641   GFRAA >89 05/01/2014 1213    CBC    Component Value Date/Time   WBC 7.0 04/13/2022 1551   WBC 16.7 (H) 02/20/2020 1446   RBC 4.37 04/13/2022 1551   RBC 4.96 02/20/2020 1446   HGB 11.4 04/13/2022 1551   HCT 34.9 04/13/2022 1551   PLT 381 04/13/2022 1551   MCV 80 04/13/2022 1551   MCH 26.1 (L) 04/13/2022 1551   MCH 25.2 (L) 02/20/2020 1446   MCHC 32.7 04/13/2022 1551   MCHC 31.8 02/20/2020 1446   RDW 13.2 04/13/2022 1551   LYMPHSABS 2.2 04/13/2022 1551   MONOABS 0.7 05/01/2014 1213   EOSABS 0.1 04/13/2022 1551   BASOSABS 0.0 04/13/2022 1551    Baseline Immunosuppressant Therapy Labs TB GOLD    Latest Ref Rng & Units 01/05/2022    3:11 PM  Quantiferon TB Gold  Quantiferon TB Gold Plus Negative Negative    Hepatitis Panel    Latest Ref Rng & Units 08/22/2015   11:24 AM  Hepatitis  Hep B Surface Ag Negative Negative    HIV Lab Results  Component Value  Date   HIV Non Reactive 08/22/2015   Immunoglobulins    Latest Ref Rng & Units 12/11/2018    3:58 PM  Immunoglobulin Electrophoresis  IgG 586 - 1,602 mg/dL 2,490   IgM 26 - 217 mg/dL 121    SPEP    Latest Ref Rng & Units 04/08/2022    4:11 PM  Serum Protein Electrophoresis  Total Protein 6.0 - 8.5 g/dL 8.2    G6PD No results found for: "G6PDH" TPMT No results found for: "TPMT"   Chest x-ray: ***  Assessment/Plan:  Reviewed importance of holding *** with signs/symptoms of an infections, if antibiotics are prescribed to treat an active infection, and with invasive procedures  Demonstrated proper injection technique with *** demo device  Patient able to demonstrate proper injection technique using the teach back method.  Patient self injected in the {injsitedsg:28167} with:  Sample Medication: *** NDC: *** Lot: *** Expiration: ***  Patient tolerated well.  Observed for 30 mins in office for adverse reaction and ***.   Patient is to return in 1 month for labs and 6-8 weeks for follow-up appointment.  Standing orders placed.   *** approved through {specialtycoverage:25706} .   Rx sent to: {SpecialtyPharmacies:25705}.  Patient provided with pharmacy phone number and advised to  call later this week to schedule shipment to home.  Patient will continue Humira 28m SQ every 14 days days as monotherapy  All questions encouraged and answered.  Instructed patient to call with any further questions or concerns.  DKnox Saliva PharmD, MPH, BCPS, CPP Clinical Pharmacist (Rheumatology and Pulmonology)  05/17/2022 8:00 AM

## 2022-05-17 NOTE — Telephone Encounter (Signed)
Patient r/s Humira new start to Thursday, 05/20/2022 due to cough  Knox Saliva, PharmD, MPH, BCPS, CPP Clinical Pharmacist (Rheumatology and Pulmonology)

## 2022-05-18 ENCOUNTER — Other Ambulatory Visit: Payer: Self-pay

## 2022-05-18 ENCOUNTER — Other Ambulatory Visit (HOSPITAL_COMMUNITY): Payer: Self-pay

## 2022-05-18 MED ORDER — PREDNISONE 5 MG PO TABS
ORAL_TABLET | ORAL | 0 refills | Status: DC
  Filled 2022-05-18: qty 21, 6d supply, fill #0

## 2022-05-18 MED ORDER — AZITHROMYCIN 250 MG PO TABS
ORAL_TABLET | ORAL | 0 refills | Status: AC
  Filled 2022-05-18: qty 6, 5d supply, fill #0

## 2022-05-20 ENCOUNTER — Ambulatory Visit: Payer: Self-pay | Attending: Internal Medicine | Admitting: Pharmacist

## 2022-05-20 DIAGNOSIS — Z7189 Other specified counseling: Secondary | ICD-10-CM

## 2022-05-20 DIAGNOSIS — M05761 Rheumatoid arthritis with rheumatoid factor of right knee without organ or systems involvement: Secondary | ICD-10-CM

## 2022-05-20 DIAGNOSIS — Z79899 Other long term (current) drug therapy: Secondary | ICD-10-CM

## 2022-05-20 MED ORDER — HUMIRA (2 PEN) 40 MG/0.4ML ~~LOC~~ AJKT: 40.0000 mg | AUTO-INJECTOR | SUBCUTANEOUS | 0 refills | Status: DC

## 2022-05-20 NOTE — Patient Instructions (Addendum)
Please start Humira when you have finished your antibiotic AND your symptoms have resolved.  Follow-up with your primary care if symptoms have not resolved.  Please complete your paperwork for financial assistance renewal through [facility].

## 2022-05-20 NOTE — Progress Notes (Signed)
Pharmacy Note  Subjective:   Patient presents to clinic today to receive first dose of HUMIRA for rheumatoid arthritis. She has been holding Enbrel due to infection. Her last dose was 05/08/2022 - she is on azithromycin course that she will be finishing on Saturday, 05/22/2022. Was also prescribed prednisone taper pack. She states that her [facility] financial assistance has lapsed so has been unable to f/u with PCP and had to f/u with urgent care. She states she has noticed no improvement since starting azithromycin.  She states she received Humira shipment at home (one box of 2 pens)  Objective: CMP     Component Value Date/Time   NA 138 04/08/2022 1611   K 3.7 04/08/2022 1611   CL 100 04/08/2022 1611   CO2 21 04/08/2022 1611   GLUCOSE 78 04/08/2022 1611   GLUCOSE 153 (H) 02/20/2020 1446   BUN 14 04/08/2022 1611   CREATININE 0.52 (L) 04/08/2022 1611   CREATININE 0.50 05/01/2014 1213   CALCIUM 9.2 04/08/2022 1611   PROT 8.2 04/08/2022 1611   ALBUMIN 3.8 04/08/2022 1611   AST 21 04/08/2022 1611   ALT 18 04/08/2022 1611   ALKPHOS 99 04/08/2022 1611   BILITOT 0.2 04/08/2022 1611   GFRNONAA 110 05/07/2020 1641   GFRNONAA >60 02/20/2020 1446   GFRNONAA >89 05/01/2014 1213   GFRAA 127 05/07/2020 1641   GFRAA >89 05/01/2014 1213    CBC    Component Value Date/Time   WBC 7.0 04/13/2022 1551   WBC 16.7 (H) 02/20/2020 1446   RBC 4.37 04/13/2022 1551   RBC 4.96 02/20/2020 1446   HGB 11.4 04/13/2022 1551   HCT 34.9 04/13/2022 1551   PLT 381 04/13/2022 1551   MCV 80 04/13/2022 1551   MCH 26.1 (L) 04/13/2022 1551   MCH 25.2 (L) 02/20/2020 1446   MCHC 32.7 04/13/2022 1551   MCHC 31.8 02/20/2020 1446   RDW 13.2 04/13/2022 1551   LYMPHSABS 2.2 04/13/2022 1551   MONOABS 0.7 05/01/2014 1213   EOSABS 0.1 04/13/2022 1551   BASOSABS 0.0 04/13/2022 1551    Baseline Immunosuppressant Therapy Labs TB GOLD    Latest Ref Rng & Units 01/05/2022    3:11 PM  Quantiferon TB Gold  Quantiferon TB  Gold Plus Negative Negative    Hepatitis Panel    Latest Ref Rng & Units 08/22/2015   11:24 AM  Hepatitis  Hep B Surface Ag Negative Negative    HIV Lab Results  Component Value Date   HIV Non Reactive 08/22/2015   Immunoglobulins    Latest Ref Rng & Units 12/11/2018    3:58 PM  Immunoglobulin Electrophoresis  IgG 586 - 1,602 mg/dL 2,490   IgM 26 - 217 mg/dL 121    SPEP    Latest Ref Rng & Units 04/08/2022    4:11 PM  Serum Protein Electrophoresis  Total Protein 6.0 - 8.5 g/dL 8.2    G6PD No results found for: "G6PDH" TPMT No results found for: "TPMT"   Chest x-ray: 12/03/2020 -   Assessment/Plan:  Reviewed importance of holding HUMIRA with signs/symptoms of an infections, if antibiotics are prescribed to treat an active infection, and with invasive procedures  Demonstrated proper injection technique with HUMIRA demo device  Patient able to demonstrate proper injection technique using the teach back method.  We did not start Humira today due to current antibiotic use.  Humira approved through patient assistance .   Rx sent to: [employer] for Humira/[employer]: [phone].  Advise dher to  call company for refills moving forward  Once she has completed antibiotics and as long as all of her symptoms have resolved, patient will start Humira 40mg  SQ every 14 days.  All questions encouraged and answered.  Instructed patient to call with any further questions or concerns.  Knox Saliva, PharmD, MPH, BCPS, CPP Clinical Pharmacist (Rheumatology and Pulmonology)  05/20/2022 8:53 AM

## 2022-06-09 ENCOUNTER — Telehealth: Payer: Self-pay | Admitting: *Deleted

## 2022-06-09 NOTE — Telephone Encounter (Signed)
Patient contacted the office stating that she has had an infection and has been on antibiotics. Patient states she has had to hold her Humira due to still being on antibiotics. Patient states she has swollen painful joints. Patient was requesting a cream to be sent to the pharmacy to help. Patient would like prescription sent to [facility]	[place].

## 2022-06-21 ENCOUNTER — Other Ambulatory Visit: Payer: Self-pay

## 2022-06-21 ENCOUNTER — Other Ambulatory Visit (HOSPITAL_COMMUNITY): Payer: Self-pay

## 2022-07-12 ENCOUNTER — Ambulatory Visit: Payer: Self-pay | Admitting: Internal Medicine

## 2022-07-19 ENCOUNTER — Other Ambulatory Visit: Payer: Self-pay | Admitting: Student

## 2022-07-19 DIAGNOSIS — I10 Essential (primary) hypertension: Secondary | ICD-10-CM

## 2022-07-20 ENCOUNTER — Other Ambulatory Visit (HOSPITAL_COMMUNITY): Payer: Self-pay

## 2022-07-20 ENCOUNTER — Other Ambulatory Visit: Payer: Self-pay

## 2022-07-20 MED ORDER — AMLODIPINE BESYLATE 5 MG PO TABS
5.0000 mg | ORAL_TABLET | Freq: Every day | ORAL | 3 refills | Status: DC
  Filled 2022-07-20: qty 30, 30d supply, fill #0
  Filled 2022-08-11 – 2022-08-13 (×2): qty 30, 30d supply, fill #1
  Filled 2022-09-19 – 2022-09-20 (×2): qty 30, 30d supply, fill #2
  Filled 2022-10-17: qty 30, 30d supply, fill #3

## 2022-07-22 NOTE — Telephone Encounter (Signed)
Patient sent message via my chart to contact our office to schedule an appointment. 

## 2022-08-02 ENCOUNTER — Other Ambulatory Visit: Payer: Self-pay | Admitting: *Deleted

## 2022-08-02 ENCOUNTER — Ambulatory Visit: Payer: Self-pay | Admitting: Internal Medicine

## 2022-08-02 DIAGNOSIS — Z79899 Other long term (current) drug therapy: Secondary | ICD-10-CM

## 2022-08-02 DIAGNOSIS — M05761 Rheumatoid arthritis with rheumatoid factor of right knee without organ or systems involvement: Secondary | ICD-10-CM

## 2022-08-02 NOTE — Progress Notes (Deleted)
Office Visit Note  Patient: Vicki Cook             Date of Birth: Jul 09, 1960           MRN: [id number]             PCP: Romana Juniper, MD Referring: Romana Juniper,* Visit Date: 08/02/2022   Subjective:  No chief complaint on file.   History of Present Illness: Vicki Cook is a 62 y.o. female here for follow up ***   Previous HPI 04/07/22 Vicki Cook is a 62 y.o. female here for follow up for seropositive RA on Enbrel 50 mg [place] weekly. Bilateral knee steroid injection at last visit due to inflammation and pain and this has been beneficial during the past few months.  She feels like the Enbrel is definitely helping when she had to interrupt treatment temporarily saw a large worsening of symptoms.  Since resuming this though does not seem to be as effective as before.  She notices waning efficacy she takes doses on Saturday but notices some increase in joint pain and stiffness starting by around Friday of each week.  She is also frustrated today due to some incorrect billing with account numbers from [facility] resulting in a large bill that was not processed through the orange card system properly.   Previous HPI 01/06/22 Vicki Cook is a 62 y.o. female here for follow up for seropositive RA on Enbrel 50 mg [place] weekly. She was unable to get her regular labs drawn awaiting renewal of orange card for this year. She did restart the Enbrel since about 2 weeks ago but was off this for 2 months and had a lot of increased pain and swelling. Including finger swelling which she did not have before.   Previous HPI 06/15/2021 Vicki Cook is a 62 y.o. female here for follow up for seropositive RA on Enbrel 50 mg [place] weekly. She feels a good benefit although left knee pain and swelling is persistent. A small amount of elbow and wrist pain but no effusions like in her knee. She hs not suffered any serious infections or other complications.    Previous  HPI 03/23/21 Vicki Cook is a 62 y.o. female here for follow up for seropositive RA on Enbrel 50 mg [place] weekly. She feels that symptoms are partially improved on the Enbrel. Hands and wrists are significantly improved. She continues having bilateral knee swelling especially on right side. She takes occasional tylenol for symptoms. She has gained at least 5 pounds over the past few months despite trying to stay active.   Previous HPI: 09/30/20 Vicki Cook is a 62 y.o. female here for evaluation with bilateral shoulder pain and history of rheumatoid arthritis and concern for possible PMR. She was previously seen with sports medicine with shoulder ultrasound consistent with inflammatory changes and treated with intraarticular injection. She was previously evaluated for rheumatoid arthritis and recommended to start methotrexate apparently taking the medicine less than a month. Recent labs with significant inflammatory markers and having some jaw pain so concern of GCA and started prednisone taper since January but never saw vascular surgery for biopsy due to cost issues.   No Rheumatology ROS completed.   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   Eczema 12/11/2018   Rheumatoid arthritis 05/04/2015   Cough 07/22/2014   Panic attack 07/19/2014   Microcytic anemia 05/02/2014   Bilateral knee pain 03/20/2014   Healthcare maintenance 02/25/2014   HTN (hypertension) 02/07/2014    Past Medical History:  Diagnosis Date   H/O pinworm infection    Hypertension 2012   Rheumatoid arthritis ([place]) 2017    Family History  Problem Relation Age of Onset   Heart disease Mother    Hyperlipidemia Mother     Hypertension Mother    Heart disease Father    Hypertension Father    Diabetes Father    Healthy Brother    Healthy Brother    Healthy Son    Healthy Son    No past surgical history on file. Social History   Social History Narrative   Originally from    [place]   Has lived in [place]. Since 1999   2 young adult sons   Husband was a professor at [employer], but imprisoned for illegal internet activity.  Patient states he was unfairly targeted.  Tried to work with Leanne Chang to have his conviction overturned.  He was deported after release in recent years.   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   Immunization History  Administered Date(s) Administered   Influenza,inj,Quad PF,6+ Mos 02/25/2014, 02/19/2015, 03/24/2021   PFIZER(Purple Top)SARS-COV-2 Vaccination 07/27/2019, 08/20/2019   Pneumococcal Conjugate-13 09/30/2020     Objective: Vital Signs: There were no vitals taken for this visit.   Physical Exam   Musculoskeletal Exam: ***  CDAI Exam: CDAI Score: -- Patient Global: --; Provider Global: -- Swollen: --; Tender: -- Joint Exam 08/02/2022   No joint exam has been documented for this visit   There is currently no information documented on the homunculus. Go to the Rheumatology activity and complete the homunculus joint exam.  Investigation: No additional findings.  Imaging: No results found.  Recent Labs: Lab Results  Component Value Date   WBC 7.0 04/13/2022   HGB 11.4 04/13/2022   PLT 381 04/13/2022   NA 138 04/08/2022   K 3.7 04/08/2022   CL 100 04/08/2022   CO2 21 04/08/2022   GLUCOSE 78 04/08/2022   BUN 14 04/08/2022   CREATININE 0.52 (L) 04/08/2022   BILITOT 0.2 04/08/2022   ALKPHOS 99 04/08/2022   AST 21 04/08/2022   ALT 18 04/08/2022   PROT 8.2 04/08/2022   ALBUMIN 3.8 04/08/2022   CALCIUM 9.2 04/08/2022   GFRAA 127 05/07/2020   QFTBGOLD Negative 11/11/2015   QFTBGOLDPLUS Negative 01/05/2022    Speciality Comments: No specialty  comments available.  Procedures:  No procedures performed Allergies: Asa [aspirin] and Lisinopril   Assessment / Plan:     Visit Diagnoses: No diagnosis found.  ***  Orders: No orders of the defined types were placed in this encounter.  No orders of the defined types were placed in this encounter.    Follow-Up Instructions: No follow-ups on file.   Collier Salina, MD  Note - This record has been created using [employer].  Chart creation errors have been sought, but may not always  have been located. Such creation errors do not reflect on  the standard of medical care.

## 2022-08-02 NOTE — Telephone Encounter (Signed)
Refill request received via fax from [facility] for Humira   Last Fill: 05/20/2022  Labs: 04/08/2022 Creat. 0.52, BUN/Creat. Ratio 27, Albumin/Globulin Ratio 0.9  TB Gold: 01/05/2022   Next Visit: had appt 08/02/2022 has to be rescheduled.   Last Visit: 04/07/2022  DX: Rheumatoid arthritis involving both knees with positive rheumatoid factor   Current Dose per office note 04/07/2022: Humira 40 mg subcu q. 14 days   Okay to refill Humira?

## 2022-08-05 MED ORDER — HUMIRA (2 PEN) 40 MG/0.4ML ~~LOC~~ AJKT: 40.0000 mg | AUTO-INJECTOR | SUBCUTANEOUS | 0 refills | Status: DC

## 2022-08-11 ENCOUNTER — Encounter: Payer: Self-pay | Admitting: Internal Medicine

## 2022-08-11 ENCOUNTER — Other Ambulatory Visit: Payer: Self-pay | Admitting: Student

## 2022-08-11 ENCOUNTER — Other Ambulatory Visit (HOSPITAL_COMMUNITY): Payer: Self-pay

## 2022-08-11 ENCOUNTER — Ambulatory Visit: Payer: Self-pay | Attending: Internal Medicine | Admitting: Internal Medicine

## 2022-08-11 VITALS — BP 131/85 | HR 87 | Resp 16 | Ht 60.0 in | Wt 160.0 lb

## 2022-08-11 DIAGNOSIS — G8929 Other chronic pain: Secondary | ICD-10-CM

## 2022-08-11 DIAGNOSIS — M25562 Pain in left knee: Secondary | ICD-10-CM

## 2022-08-11 DIAGNOSIS — M25561 Pain in right knee: Secondary | ICD-10-CM

## 2022-08-11 DIAGNOSIS — M05761 Rheumatoid arthritis with rheumatoid factor of right knee without organ or systems involvement: Secondary | ICD-10-CM

## 2022-08-11 DIAGNOSIS — I10 Essential (primary) hypertension: Secondary | ICD-10-CM

## 2022-08-11 DIAGNOSIS — M05762 Rheumatoid arthritis with rheumatoid factor of left knee without organ or systems involvement: Secondary | ICD-10-CM

## 2022-08-11 DIAGNOSIS — Z79899 Other long term (current) drug therapy: Secondary | ICD-10-CM

## 2022-08-11 DIAGNOSIS — R7303 Prediabetes: Secondary | ICD-10-CM

## 2022-08-11 MED ORDER — DICLOFENAC SODIUM 1 % EX GEL
2.0000 g | Freq: Four times a day (QID) | CUTANEOUS | 1 refills | Status: DC | PRN
Start: 2022-08-11 — End: 2023-08-29
  Filled 2022-08-11: qty 100, fill #0

## 2022-08-11 MED ORDER — PREDNISONE 5 MG PO TABS
5.0000 mg | ORAL_TABLET | Freq: Every day | ORAL | 0 refills | Status: DC
Start: 2022-08-23 — End: 2022-08-30

## 2022-08-11 MED ORDER — PREDNISONE 5 MG PO TABS
ORAL_TABLET | ORAL | 0 refills | Status: AC
Start: 2022-08-11 — End: 2022-08-25
  Filled 2022-08-11: qty 30, 12d supply, fill #0

## 2022-08-11 NOTE — Progress Notes (Signed)
Office Visit Note  Patient: Vicki Cook             Date of Birth: [birth year]           MRN: [id number]             PCP: Morene Crocker, MD Referring: Morene Crocker,* Visit Date: 08/11/2022   Subjective:  Follow-up (Patient states her pain is getting worse.)   History of Present Illness: Vicki Cook is a 62 y.o. female here for follow up for seropositive RA after switching to Humira after last visit due to decreased efficacy on Enbrel.  So far she has taken 3 doses of medication.  No appreciable benefit in symptoms so far.  She is also taking ibuprofen as needed which is partially helpful.  Has symptoms involving hands wrists elbows but also significant knee swelling and pain with pressure or with any walking or prolonged standing.  Has not experienced significant injection reactions or any new major illness since starting Humira.  Her son accompanies her today.   Previous HPI 04/07/22 Vicki Cook is a 62 y.o. female here for follow up for seropositive RA on Enbrel 50 mg [place] weekly. Bilateral knee steroid injection at last visit due to inflammation and pain and this has been beneficial during the past few months.  She feels like the Enbrel is definitely helping when she had to interrupt treatment temporarily saw a large worsening of symptoms.  Since resuming this though does not seem to be as effective as before.  She notices waning efficacy she takes doses on Saturday but notices some increase in joint pain and stiffness starting by around Friday of each week.  She is also frustrated today due to some incorrect billing with account numbers from [facility] resulting in a large bill that was not processed through the orange card system properly.   Previous HPI 01/06/22 Vicki Cook is a 62 y.o. female here for follow up for seropositive RA on Enbrel 50 mg [place] weekly. She was unable to get her regular labs drawn awaiting renewal of orange card for this year. She did  restart the Enbrel since about 2 weeks ago but was off this for 2 months and had a lot of increased pain and swelling. Including finger swelling which she did not have before.   Previous HPI 06/15/2021 Vicki Cook is a 62 y.o. female here for follow up for seropositive RA on Enbrel 50 mg [place] weekly. She feels a good benefit although left knee pain and swelling is persistent. A small amount of elbow and wrist pain but no effusions like in her knee. She hs not suffered any serious infections or other complications.    Previous HPI 03/23/21 Vicki Cook is a 62 y.o. female here for follow up for seropositive RA on Enbrel 50 mg [place] weekly. She feels that symptoms are partially improved on the Enbrel. Hands and wrists are significantly improved. She continues having bilateral knee swelling especially on right side. She takes occasional tylenol for symptoms. She has gained at least 5 pounds over the past few months despite trying to stay active.   Previous HPI: 09/30/20 Vicki Cook is a 62 y.o. female here for evaluation with bilateral shoulder pain and history of rheumatoid arthritis and concern for possible PMR. She was previously seen with sports medicine with shoulder ultrasound consistent with inflammatory changes and treated with intraarticular injection. She was previously evaluated for rheumatoid arthritis and recommended to start methotrexate  apparently taking the medicine less than a month. Recent labs with significant inflammatory markers and having some jaw pain so concern of GCA and started prednisone taper since January but never saw vascular surgery for biopsy due to cost issues.   Review of Systems  Constitutional:  Negative for fatigue.  HENT:  Positive for mouth dryness. Negative for mouth sores.   Eyes:  Negative for dryness.  Respiratory:  Negative for shortness of breath.   Cardiovascular:  Negative for chest pain and palpitations.  Gastrointestinal:  Negative for blood  in stool, constipation and diarrhea.  Endocrine: Negative for increased urination.  Genitourinary:  Positive for involuntary urination.  Musculoskeletal:  Positive for joint pain, joint pain, joint swelling, myalgias, morning stiffness and myalgias. Negative for gait problem, muscle weakness and muscle tenderness.  Skin:  Negative for color change, rash, hair loss and sensitivity to sunlight.  Allergic/Immunologic: Negative for susceptible to infections.  Neurological:  Negative for dizziness and headaches.  Hematological:  Negative for swollen glands.  Psychiatric/Behavioral:  Negative for depressed mood and sleep disturbance. The patient is not nervous/anxious.     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   Eczema 12/11/2018   Rheumatoid arthritis 05/04/2015   Cough 07/22/2014   Panic attack 07/19/2014   Microcytic anemia 05/02/2014   Bilateral knee pain 03/20/2014   Healthcare maintenance 02/25/2014   HTN (hypertension) 02/07/2014    Past Medical History:  Diagnosis Date   H/O pinworm infection    Hypertension 2012   Rheumatoid arthritis 2017    Family History  Problem Relation Age of Onset   Heart disease Mother    Hyperlipidemia Mother    Hypertension Mother    Heart disease Father    Hypertension Father    Diabetes Father    Healthy Brother    Healthy Brother    Healthy Son    Healthy Son    History reviewed. No pertinent surgical history. Social History   Social History Narrative   Originally from    [place]   Has lived in [place]. Since 1999   2 young adult sons   Husband was a professor at [employer], but imprisoned for illegal internet activity.  Patient states he was unfairly  targeted.  Tried to work with Suzie Portela to have his conviction overturned.  He was deported after release in recent years.   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   Immunization History  Administered Date(s) Administered   Influenza,inj,Quad PF,6+ Mos 02/25/2014, 02/19/2015, 03/24/2021   PFIZER(Purple Top)SARS-COV-2 Vaccination 07/27/2019, 08/20/2019   Pneumococcal Conjugate-13 09/30/2020     Objective: Vital Signs: BP 131/85 (BP Location: Left Arm, Patient Position: Sitting, Cuff Size: Normal)   Pulse 87   Resp 16   Ht 5' (1.524 m)   Wt 160 lb (72.6 kg)   BMI 31.25 kg/m    Physical Exam Eyes:     Conjunctiva/sclera: Conjunctivae normal.  Cardiovascular:     Rate and Rhythm: Normal rate and regular rhythm.  Pulmonary:     Effort: Pulmonary effort is normal.     Breath sounds: Normal breath sounds.  Skin:    General: Skin is warm and dry.  Neurological:  Mental Status: She is alert.  Psychiatric:        Mood and Affect: Mood normal.      Musculoskeletal Exam:  Shoulders full ROM no tenderness or swelling Bilateral elbow and wrist range of motion is restricted with some pain though no palpable synovitis Fingers full ROM tenderness to pressure right second third MCPs and PIPs left hand more second third MCPs Bilateral knee swelling and tenderness to pressure, limited active and passive range of motion, right knee with posterior cyst   CDAI Exam: CDAI Score: 26  Patient Global: 70 mm; Provider Global: 50 mm Swollen: 2 ; Tender: 12  Joint Exam 08/11/2022      Right  Left  Elbow   Tender   Tender  Wrist   Tender   Tender  MCP 2   Tender   Tender  MCP 3   Tender   Tender  PIP 2   Tender     PIP 3   Tender     Knee  Swollen Tender  Swollen Tender      Investigation: No additional findings.  Imaging: No results found.  Recent Labs: Lab Results  Component Value Date   WBC 9.7 08/17/2022   HGB 10.9 (L) 08/17/2022   PLT 381 04/13/2022   NA  138 08/17/2022   K 4.2 08/17/2022   CL 101 08/17/2022   CO2 20 08/17/2022   GLUCOSE 175 (H) 08/17/2022   BUN 10 08/17/2022   CREATININE 0.54 (L) 08/17/2022   BILITOT <0.2 08/17/2022   ALKPHOS 107 08/17/2022   AST 19 08/17/2022   ALT 22 08/17/2022   PROT 8.4 08/17/2022   ALBUMIN 3.8 08/17/2022   CALCIUM 8.9 08/17/2022   GFRAA 127 05/07/2020   QFTBGOLD Negative 11/11/2015   QFTBGOLDPLUS Negative 01/05/2022    Speciality Comments: Labs need to be drawn from [facility] Indigent care fund  Procedures:  No procedures performed Allergies: Asa [aspirin] and Lisinopril   Assessment / Plan:     Visit Diagnoses: Rheumatoid arthritis involving both knees with positive rheumatoid factor - Plan: Sedimentation rate, diclofenac Sodium (VOLTAREN) 1 % GEL, predniSONE (DELTASONE) 5 MG tablet, predniSONE (DELTASONE) 5 MG tablet  RA high disease activity so far doing worse on Humira then after Enbrel even though symptoms were worsening before the switch.  Recommend change to a totally different mechanism of action.  Discussed options such as Orencia versus Rinvoq preferred to try an oral medication and possibly faster acting to limit the amount of time needing maintenance steroids for symptom control.  Prednisone taper for 12 days for the current exacerbation recommend then maintaining on 5 mg until getting on the new treatment and symptoms improved. Discontinue Humira. Plan to start Rinvoq 15 mg p.o. daily.  Prediabetes  Experiencing some weight gain and worsened hyperglycemia while on oral prednisone, so trying to limit total amount of exposure tapering back down to 5 mg daily and ideally getting off if treatment changes more effective.  High risk medication use - Plan: CBC with Differential/Platelet, COMPLETE METABOLIC PANEL WITH GFR, Lipid panel  Checking CBC CMP and lipid panel for baseline medication monitoring planning switch to rinvoq.  Discussed risk of medication including cytopenias,  hepatotoxicity or hyperlipidemia, major cardiovascular events, thrombosis, or diverticulitis.  No personal history of blood clot or diverticulitis.  Chronic pain of both knees - Plan: diclofenac Sodium (VOLTAREN) 1 % GEL  New prescription for Voltaren gel for use as needed to help with knee pain reports some benefit previously.  Orders: Orders Placed This Encounter  Procedures   CBC with Differential/Platelet   COMPLETE METABOLIC PANEL WITH GFR   Lipid panel   Sedimentation rate   Meds ordered this encounter  Medications   diclofenac Sodium (VOLTAREN) 1 % GEL    Sig: Apply 2 g topically every 6 (six) hours as needed.    Dispense:  100 g    Refill:  1   predniSONE (DELTASONE) 5 MG tablet    Sig: Take 4 tablets  by mouth daily with breakfast for 3days, THEN 3 tabs  daily with breakfast for 3days, THEN 2 tabs daily with breakfast for 3days, THEN 1 tab daily with breakfast for 3days.    Dispense:  30 tablet    Refill:  0   predniSONE (DELTASONE) 5 MG tablet    Sig: Take 1 tablet (5 mg total) by mouth daily with breakfast.    Dispense:  30 tablet    Refill:  0     Follow-Up Instructions: Return in about 2 months (around 10/11/2022) for RA UPA start f/u 2mos.   Fuller Planhristopher W Noely Kuhnle, MD  Note - This record has been created using [employer] software.  Chart creation errors have been sought, but may not always  have been located. Such creation errors do not reflect on  the standard of medical care.

## 2022-08-11 NOTE — Patient Instructions (Signed)
Upadacitinib Extended-Release Tablets What is this medication? UPADACITINIB (ue PAD a SYE ti nib) treats autoimmune conditions, such as arthritis, eczema, and ulcerative colitis. It is often used when other medications have not worked well enough or cannot be tolerated. It works by slowing down an overactive immune system. This decreases inflammation. This medicine may be used for other purposes; ask your health care provider or pharmacist if you have questions. COMMON BRAND NAME(S): RINVOQ What should I tell my care team before I take this medication? They need to know if you have any of these conditions: Blood clots Cancer Diabetes (high blood sugar) Heart disease High blood pressure High cholesterol Immune system problems Infection, especially a viral infection, such as chickenpox, cold sores, or herpes Infection, such as tuberculosis (TB), or other bacterial, fungal or viral infection Kidney disease Liver disease Low blood cell levels (white cells, red cells, and platelets) Lung or breathing disease, such as asthma or COPD Organ transplant Recent or upcoming vaccine Skin cancer/melanoma Stomach or intestine problems Stroke Tobacco use An unusual or allergic reaction to upadacitinib, other medications, foods, dyes or preservatives Pregnant or trying to get pregnant Breast-feeding How should I use this medication? Take this medication by mouth with water. Take it as directed on the prescription label at the same time every day. Do not cut, crush, or chew this medication. Swallow the tablets whole. You can take it with or without food. If it upsets your stomach, take it with food. Do not take this medication with grapefruit juice. Keep taking it unless your care team tells you to stop. A special MedGuide will be given to you by the pharmacist with each prescription and refill. Be sure to read this information carefully each time. Talk to your care team about the use of this  medication in children. While this medication may be prescribed for children as young as 12 years for selected conditions, precautions do apply. Overdosage: If you think you have taken too much of this medicine contact a poison control center or emergency room at once. NOTE: This medicine is only for you. Do not share this medicine with others. What if I miss a dose? If you miss a dose, take it as soon as you can. If it is almost time for your next dose, take only that dose. Do not take double or extra doses. What may interact with this medication? Do not take this medication with any of the following: Baricitinib Tofacitinib This medication may also interact with the following: Azathioprine Certain antivirals for hepatitis or HIV Biologic medications, such as abatacept, adalimumab, anakinra, certolizumab, etanercept, golimumab, infliximab, rituximab, secukinumab, tocilizumab, ustekinumab Certain medications for fungal infections, such as ketoconazole, itraconazole, posaconazole, or voriconazole Certain medications for seizures, such as carbamazepine, phenobarbital, phenytoin Clarithromycin Cyclosporine Grapefruit and grapefruit juice Live virus vaccines Medications that lower your chance of fighting infection Mifepristone Nefazodone Rifampin Supplements, such as St. John's wort This list may not describe all possible interactions. Give your health care provider a list of all the medicines, herbs, non-prescription drugs, or dietary supplements you use. Also tell them if you smoke, drink alcohol, or use illegal drugs. Some items may interact with your medicine. What should I watch for while using this medication? Visit your care team for regular checks on your progress. Tell your care team if your symptoms do not start to get better or if they get worse. You may need blood work while you are taking this medication. This medication may increase your risk of  getting an infection. Call your  care team for advice if you get a fever, chills, sore throat, or other symptoms of a cold or flu. Do not treat yourself. Try to avoid being around people who are sick. Your care team will screen you for tuberculosis (TB) before you start this medication. If they think you are at risk, you may be treated with medication for TB. You should start taking the medication for TB before you start this medication. Make sure to finish the full course of TB medication. Avoid taking medications that contain aspirin, acetaminophen, ibuprofen, naproxen, or ketoprofen unless instructed by your care team. These medications may hide a fever. Talk to your care team about your risk of cancer. You may be more at risk for certain types of cancer if you take this medication. This medication can make you more sensitive to the sun. Keep out of the sun. If you cannot avoid being in the sun, wear protective clothing and sunscreen. Do not use sun lamps, tanning beds, or tanning booths. Tell your care team right away if you have any change in your eyesight. Talk to your care team if you often see part of the tablet in your stool. Talk to your care team if you may be pregnant. Serious birth defects can occur if you take this medication during pregnancy and for 4 weeks after the last dose. You will need a negative pregnancy test before starting this medication. Contraception is recommended while taking this medication and for 4 weeks after the last dose. Your care team can help you find the option that works for you. Do not breastfeed while taking this medication and for 6 days after the last dose. What side effects may I notice from receiving this medication? Side effects that you should report to your care team as soon as possible: Allergic reactions--skin rash, itching, hives, swelling of the face, lips, tongue, or throat Blood clot--pain, swelling, or warmth in the leg, shortness of breath, chest pain Change in vision Heart  attack--pain or tightness in the chest, shoulders, arms, or jaw, nausea, shortness of breath, cold or clammy skin, feeling faint or lightheaded Infection--fever, chills, cough, sore throat, wounds that don't heal, pain or trouble when passing urine, general feeling of discomfort or being unwell Liver injury--right upper belly pain, loss of appetite, nausea, light-colored stool, dark yellow or brown urine, yellowing skin or eyes, unusual weakness or fatigue Low red blood cell level--unusual weakness or fatigue, dizziness, headache, trouble breathing Sudden or severe stomach pain, bloody diarrhea, fever, nausea, vomiting Stroke--sudden numbness or weakness of the face, arm, or leg, trouble speaking, confusion, trouble walking, loss of balance or coordination, dizziness, severe headache, change in vision Side effects that usually do not require medical attention (report to your care team if they continue or are bothersome): Acne Cough Headache Nausea Runny or stuffy nose This list may not describe all possible side effects. Call your doctor for medical advice about side effects. You may report side effects to FDA at 1-800-FDA-1088. Where should I keep my medication? Keep out of the reach of children and pets. Store at room temperature between 20 and 25 degrees C (68 and 77 degrees F). Protect from moisture. Keep the container tightly closed. Keep this medication in the original container until you are ready to take it. Get rid of any unused medication after the expiration date. To get rid of medications that are no longer needed or have expired: Take the medication to a medication take-back  program. Check with your pharmacy or law enforcement to find a location. If you cannot return the medication, check the label or package insert to see if the medication should be thrown out in the garbage or flushed down the toilet. If you are not sure, ask your care team. If it is safe to put it in the trash,  empty the medication out of the container. Mix the medication with cat litter, dirt, coffee grounds, or other unwanted substance. Seal the mixture in a bag or container. Put it in the trash. NOTE: This sheet is a summary. It may not cover all possible information. If you have questions about this medicine, talk to your doctor, pharmacist, or health care provider.  2023 Elsevier/Gold Standard (2022-03-18 00:00:00)

## 2022-08-12 ENCOUNTER — Telehealth: Payer: Self-pay | Admitting: Pharmacist

## 2022-08-12 ENCOUNTER — Other Ambulatory Visit: Payer: Self-pay

## 2022-08-12 ENCOUNTER — Other Ambulatory Visit (HOSPITAL_COMMUNITY): Payer: Self-pay

## 2022-08-12 DIAGNOSIS — M05761 Rheumatoid arthritis with rheumatoid factor of right knee without organ or systems involvement: Secondary | ICD-10-CM

## 2022-08-12 DIAGNOSIS — Z79899 Other long term (current) drug therapy: Secondary | ICD-10-CM

## 2022-08-12 NOTE — Telephone Encounter (Addendum)
Patient is uninsured so will have to go through [employer] for access to Rinvoq.  Patient portion completed. Provider portion placed in Dr. Gregary Cromer folder for signature.  Will submit after labs have resulted. She goes to the hospital for labs since she is uninsured  ----- Message from Metta Clines, [place] sent at 08/11/2022  4:27 PM EDT ----- Regarding: New Start Rinvoq

## 2022-08-12 NOTE — Telephone Encounter (Signed)
Received signed provider form. PAP submission is pending updated baseline labs.  Chesley Mires, PharmD, MPH, BCPS, CPP Clinical Pharmacist (Rheumatology and Pulmonology)

## 2022-08-13 ENCOUNTER — Other Ambulatory Visit (HOSPITAL_COMMUNITY): Payer: Self-pay

## 2022-08-13 MED ORDER — LOSARTAN POTASSIUM 50 MG PO TABS
50.0000 mg | ORAL_TABLET | Freq: Every day | ORAL | 1 refills | Status: DC
Start: 2022-08-13 — End: 2022-08-30
  Filled 2022-08-13: qty 90, 90d supply, fill #0
  Filled 2022-08-13: qty 30, 30d supply, fill #0

## 2022-08-17 ENCOUNTER — Other Ambulatory Visit: Payer: Self-pay

## 2022-08-17 ENCOUNTER — Other Ambulatory Visit: Payer: Self-pay | Admitting: Internal Medicine

## 2022-08-17 DIAGNOSIS — M05761 Rheumatoid arthritis with rheumatoid factor of right knee without organ or systems involvement: Secondary | ICD-10-CM

## 2022-08-17 DIAGNOSIS — Z79899 Other long term (current) drug therapy: Secondary | ICD-10-CM

## 2022-08-17 NOTE — Telephone Encounter (Signed)
Patient was here today for lab work for Dr. Sheliah Hatch.  Made patient aware that her doctor would like her to schedule a future appointment.  Does not want to schedule an appointment until she is approved for the [employer]([facility] Financial Assistance Program) without that approval she will receive a bill.  Forwarding back to PCP to make her aware.

## 2022-08-17 NOTE — Telephone Encounter (Signed)
Spoke with patient. She states she is planning to get labs done at [facility] today. Will await results prior to PAP submission  Chesley Mires, PharmD, MPH, BCPS, CPP Clinical Pharmacist (Rheumatology and Pulmonology)

## 2022-08-17 NOTE — Addendum Note (Signed)
Addended by: Bufford Spikes on: 08/17/2022 04:00 PM   Modules accepted: Orders

## 2022-08-18 LAB — CBC WITH DIFFERENTIAL
Basophils Absolute: 0 10*3/uL (ref 0.0–0.2)
Basos: 0 %
EOS (ABSOLUTE): 0 10*3/uL (ref 0.0–0.4)
Eos: 0 %
Hematocrit: 34.1 % (ref 34.0–46.6)
Hemoglobin: 10.9 g/dL — ABNORMAL LOW (ref 11.1–15.9)
Immature Grans (Abs): 0.1 10*3/uL (ref 0.0–0.1)
Immature Granulocytes: 1 %
Lymphocytes Absolute: 1.5 10*3/uL (ref 0.7–3.1)
Lymphs: 15 %
MCH: 24.5 pg — ABNORMAL LOW (ref 26.6–33.0)
MCHC: 32 g/dL (ref 31.5–35.7)
MCV: 77 fL — ABNORMAL LOW (ref 79–97)
Monocytes Absolute: 0.3 10*3/uL (ref 0.1–0.9)
Monocytes: 3 %
Neutrophils Absolute: 7.8 10*3/uL — ABNORMAL HIGH (ref 1.4–7.0)
Neutrophils: 81 %
RBC: 4.44 x10E6/uL (ref 3.77–5.28)
RDW: 14.2 % (ref 11.7–15.4)
WBC: 9.7 10*3/uL (ref 3.4–10.8)

## 2022-08-18 LAB — CMP14 + ANION GAP
ALT: 22 IU/L (ref 0–32)
AST: 19 IU/L (ref 0–40)
Albumin/Globulin Ratio: 0.8 — ABNORMAL LOW (ref 1.2–2.2)
Albumin: 3.8 g/dL (ref 3.8–4.9)
Alkaline Phosphatase: 107 IU/L (ref 44–121)
Anion Gap: 17 mmol/L (ref 10.0–18.0)
BUN/Creatinine Ratio: 19 (ref 9–23)
BUN: 10 mg/dL (ref 6–24)
Bilirubin Total: <0.2 mg/dL (ref 0.0–1.2)
CO2: 20 mmol/L (ref 20–29)
Calcium: 8.9 mg/dL (ref 8.7–10.2)
Chloride: 101 mmol/L (ref 96–106)
Creatinine, Ser: 0.54 mg/dL — ABNORMAL LOW (ref 0.57–1.00)
Globulin, Total: 4.6 g/dL — ABNORMAL HIGH (ref 1.5–4.5)
Glucose: 175 mg/dL — ABNORMAL HIGH (ref 70–99)
Potassium: 4.2 mmol/L (ref 3.5–5.2)
Sodium: 138 mmol/L (ref 134–144)
Total Protein: 8.4 g/dL (ref 6.0–8.5)
eGFR: 106 mL/min/{1.73_m2} (ref >59)

## 2022-08-18 LAB — LIPID PANEL
Chol/HDL Ratio: 3.1 ratio (ref 0.0–4.4)
Cholesterol, Total: 137 mg/dL (ref 100–199)
HDL: 44 mg/dL (ref >39)
LDL Chol Calc (NIH): 71 mg/dL (ref 0–99)
Triglycerides: 121 mg/dL (ref 0–149)
VLDL Cholesterol Cal: 22 mg/dL (ref 5–40)

## 2022-08-18 LAB — SEDIMENTATION RATE: Sed Rate: 101 mm/hr — ABNORMAL HIGH (ref 0–40)

## 2022-08-18 NOTE — Telephone Encounter (Signed)
Submitted Patient Assistance Application to [employer] for [facility] along with provider portion, patient portion, and med list. PA not required as patient is uninsured. Will update patient when we receive a response.  Phone: [phone] Fax: [phone]  Chesley Mires, PharmD, MPH, BCPS, CPP Clinical Pharmacist (Rheumatology and Pulmonology)

## 2022-08-19 NOTE — Progress Notes (Signed)
Sedimentation rate 101 consistent with the increased rheumatoid arthritis disease activity that we are seeing.  Her blood glucose is elevated at 175 likely worse from the oral prednisone.  Cholesterol levels are in the normal range no problem for starting rinvoq after we get approval.

## 2022-08-27 MED ORDER — RINVOQ 15 MG PO TB24
15.0000 mg | ORAL_TABLET | Freq: Every day | ORAL | 0 refills | Status: DC
Start: 2022-09-01 — End: 2022-11-08

## 2022-08-27 NOTE — Telephone Encounter (Signed)
Called [employer] for update on patient's Rinvoq application. Per rep, patient is approved through 04/20/2023. First shipment is scheduled to be received by 09/01/2022. Rep will fax approval letter.  Phone: [phone]  Chesley Mires, PharmD, MPH, BCPS, CPP Clinical Pharmacist (Rheumatology and Pulmonology)

## 2022-08-27 NOTE — Addendum Note (Signed)
Addended by: Murrell Redden on: 08/27/2022 04:11 PM   Modules accepted: Orders

## 2022-08-29 ENCOUNTER — Encounter: Payer: Self-pay | Admitting: Internal Medicine

## 2022-08-29 NOTE — Progress Notes (Unsigned)
   CC: follow up  HPI:  Vicki Cook is a 62 y.o. with medical history of HTN, RA, prediabetes and lab abnormalities presenting to [facility] for a follow up.   Please see problem-based list for further details, assessments, and plans.  Past Medical History:  Diagnosis Date   H/O pinworm infection    Hypertension 2012   Rheumatoid arthritis (HCC) 2017    Current Outpatient Medications (Endocrine & Metabolic):    predniSONE (DELTASONE) 5 MG tablet, Take 1 tablet (5 mg total) by mouth daily with breakfast.  Current Outpatient Medications (Cardiovascular):    amLODipine (NORVASC) 5 MG tablet, Take 1 tablet (5 mg total) by mouth daily.   losartan (COZAAR) 50 MG tablet, Take 1 tablet (50 mg total) by mouth daily.   Current Outpatient Medications (Analgesics):    [START ON 09/01/2022] Upadacitinib ER (RINVOQ) 15 MG TB24, Take 1 tablet (15 mg total) by mouth daily.   Current Outpatient Medications (Other):    Calcium-Magnesium-Vitamin D (CALCIUM MAGNESIUM PO), Take by mouth.   diclofenac Sodium (VOLTAREN) 1 % GEL, Apply 2 g topically every 6 (six) hours as needed.   Omega-3 Fatty Acids (FISH OIL PO), Take by mouth. (Patient not taking: Reported on 03/23/2021)   triamcinolone ointment (KENALOG) 0.1 %, Apply to affected areas twice daily as needed   VITAMIN D PO, Take by mouth.  Review of Systems:  Review of system negative unless stated in the problem list or HPI.    Physical Exam:  There were no vitals filed for this visit. Physical Exam General: NAD HENT: NCAT Lungs: CTAB, no wheeze, rhonchi or rales.  Cardiovascular: Normal heart sounds, no r/m/g, 2+ pulses in all extremities. No LE edema Abdomen: No TTP, normal bowel sounds MSK: No asymmetry or muscle atrophy.  Skin: no lesions noted on exposed skin Neuro: Alert and oriented x4. CN grossly intact Psych: Normal mood and normal affect   Assessment & Plan:   No problem-specific Assessment & Plan notes found for this  encounter.   See Encounters Tab for problem based charting.  Patient Discussed with Dr. {NAMES:[id number]::"Guilloud","Hoffman","Mullen","Narendra","Vincent","Machen","Lau","Hatcher","Williams"} Gwenevere Abbot, MD Eligha Bridegroom. [facility] Internal Medicine Residency, PGY-2

## 2022-08-30 ENCOUNTER — Encounter: Payer: Self-pay | Admitting: Internal Medicine

## 2022-08-30 ENCOUNTER — Other Ambulatory Visit: Payer: Self-pay

## 2022-08-30 ENCOUNTER — Ambulatory Visit (INDEPENDENT_AMBULATORY_CARE_PROVIDER_SITE_OTHER): Payer: Self-pay | Admitting: Internal Medicine

## 2022-08-30 ENCOUNTER — Other Ambulatory Visit (HOSPITAL_COMMUNITY): Payer: Self-pay

## 2022-08-30 VITALS — BP 141/86 | HR 93 | Temp 98.1°F | Ht 60.0 in | Wt 158.9 lb

## 2022-08-30 DIAGNOSIS — E118 Type 2 diabetes mellitus with unspecified complications: Secondary | ICD-10-CM

## 2022-08-30 DIAGNOSIS — Z0000 Encounter for general adult medical examination without abnormal findings: Secondary | ICD-10-CM

## 2022-08-30 DIAGNOSIS — M05762 Rheumatoid arthritis with rheumatoid factor of left knee without organ or systems involvement: Secondary | ICD-10-CM

## 2022-08-30 DIAGNOSIS — D892 Hypergammaglobulinemia, unspecified: Secondary | ICD-10-CM

## 2022-08-30 DIAGNOSIS — I10 Essential (primary) hypertension: Secondary | ICD-10-CM

## 2022-08-30 DIAGNOSIS — M05761 Rheumatoid arthritis with rheumatoid factor of right knee without organ or systems involvement: Secondary | ICD-10-CM

## 2022-08-30 MED ORDER — LOSARTAN POTASSIUM 50 MG PO TABS
75.0000 mg | ORAL_TABLET | Freq: Every day | ORAL | 0 refills | Status: DC
Start: 2022-08-30 — End: 2022-12-14
  Filled 2022-08-30: qty 120, 80d supply, fill #0
  Filled 2022-09-19: qty 45, 30d supply, fill #0
  Filled 2022-10-17: qty 45, 30d supply, fill #1
  Filled 2022-12-13: qty 45, 30d supply, fill #2

## 2022-08-30 NOTE — Assessment & Plan Note (Signed)
Follows with Dr. Dimple Casey. Rheumatologist plans to start Rinvoq and pt has PA approved for this medication. She will receive first order on 09/01/22.

## 2022-08-30 NOTE — Patient Instructions (Addendum)
Vicki Cook, it was a pleasure seeing you today! You endorsed feeling well today. Below are some of the things we talked about this visit. We look forward to seeing you in the follow up appointment!  Today we discussed: We will increase your losartan to 75 mg which is taking 1.5 tablets of the 50 mg tablet daily.  I will reach out to pharmacy to see if they can get you samples of Rinvoq.    I have ordered the following labs today:  Lab Orders  No laboratory test(s) ordered today      Referrals ordered today:   Referral Orders  No referral(s) requested today     I have ordered the following medication/changed the following medications:   Stop the following medications: There are no discontinued medications.   Start the following medications: No orders of the defined types were placed in this encounter.    Follow-up: 1 month follow up  Please make sure to arrive 15 minutes prior to your next appointment. If you arrive late, you may be asked to reschedule.   We look forward to seeing you next time. Please call our clinic at [phone] if you have any questions or concerns. The best time to call is Monday-Friday from 9am-4pm, but there is someone available 24/7. If after hours or the weekend, call the main hospital number and ask for the Internal Medicine Resident On-Call. If you need medication refills, please notify your pharmacy one week in advance and they will send [place] a request.  Thank you for letting us take part in your care. Wishing you the best!  Thank you, Gwenevere Abbot, MD

## 2022-08-30 NOTE — Assessment & Plan Note (Signed)
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 Can consider re-testing once pt has insurance as last testing performed 4 years ago and if unchanged can discontinue further testing as it is likely due to RA.

## 2022-08-30 NOTE — Assessment & Plan Note (Signed)
Pt's repeat A1c was 6.5 consistent with DMII. She was prediabetic previously and with repeat steroid use due to her RA has been pushed into DM. Currently A1c is <7.0 so will advise lifestyle modifications to decrease A1c but can consider pharmacotherapy at future visits if needed.

## 2022-08-30 NOTE — Assessment & Plan Note (Signed)
Pt with HTN that is uncontrolled. Pt is on losartan 50 mg qd and amlodipine 5 mg qd. Normal renal fxn in 08/2022. Given elevated reading here and previous elevated readings, will increase losartan to 75 mg qd. Follow up in one month for repeat BMP.

## 2022-09-04 NOTE — Progress Notes (Signed)
Internal Medicine Clinic Attending  Case discussed with Dr. Khan  At the time of the visit.  We reviewed the resident's history and exam and pertinent patient test results.  I agree with the assessment, diagnosis, and plan of care documented in the resident's note.  

## 2022-09-15 ENCOUNTER — Other Ambulatory Visit: Payer: Self-pay

## 2022-09-15 ENCOUNTER — Ambulatory Visit (INDEPENDENT_AMBULATORY_CARE_PROVIDER_SITE_OTHER): Payer: Self-pay | Admitting: Family Medicine

## 2022-09-15 ENCOUNTER — Other Ambulatory Visit (HOSPITAL_COMMUNITY): Payer: Self-pay

## 2022-09-15 VITALS — BP 136/80 | Ht 60.0 in | Wt 153.0 lb

## 2022-09-15 DIAGNOSIS — M25522 Pain in left elbow: Secondary | ICD-10-CM

## 2022-09-15 DIAGNOSIS — M79642 Pain in left hand: Secondary | ICD-10-CM

## 2022-09-15 MED ORDER — MELOXICAM 15 MG PO TABS
15.0000 mg | ORAL_TABLET | Freq: Every day | ORAL | 1 refills | Status: DC
  Filled 2022-09-15: qty 30, 30d supply, fill #0
  Filled 2022-10-17: qty 30, 30d supply, fill #1

## 2022-09-15 NOTE — Patient Instructions (Signed)
Your elbow pain is due to arthritis. These are the different medications you can take for this: Tylenol 500mg  1-2 tabs three times a day for pain. Capsaicin, aspercreme, or biofreeze topically up to four times a day may also help with pain. Some supplements that may help for arthritis: Boswellia extract, curcumin, pycnogenol Meloxicam 15mg  daily with food for pain and inflammation - can take 1/2 tablet of this also.  Don't take ibuprofen while you're on this. Ice 15 minutes at a time 3-4 times a day as needed to help with pain. Follow up with me in 1 month.

## 2022-09-16 ENCOUNTER — Encounter: Payer: Self-pay | Admitting: Family Medicine

## 2022-09-16 NOTE — Progress Notes (Signed)
PCP: Morene Crocker, MD  Subjective:   HPI: Patient is a 62 y.o. female here for left arm pain.  Patient reports for about 2 weeks she's had pain in anterior left forearm. Associated difficulty extending elbow causing pain. No acute injury but did have increase in activity level prior to forearm starting to hurt. No numbness/tingling. She's being seen for rheumatoid arthritis by Dr. Dimple Casey, currently on Rinvoq which she just started - not much change in her overall symptoms yet with this.  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 30 tablet 3   Calcium-Magnesium-Vitamin D (CALCIUM MAGNESIUM PO) Take by mouth.     diclofenac Sodium (VOLTAREN) 1 % GEL Apply 2 g topically every 6 (six) hours as needed. 100 g 1   losartan (COZAAR) 50 MG tablet Take 1.5 tablets (75 mg total) by mouth daily. 120 tablet 0   triamcinolone ointment (KENALOG) 0.1 % Apply to affected areas twice daily as needed 80 g 3   Upadacitinib ER (RINVOQ) 15 MG TB24 Take 1 tablet (15 mg total) by mouth daily. 90 tablet 0   VITAMIN D PO Take by mouth.     No current facility-administered medications on file prior to visit.    History reviewed. No pertinent surgical history.  Allergies  Allergen Reactions   Asa [Aspirin] Nausea And Vomiting and Other (See Comments)    Burning in stomach    Lisinopril Other (See Comments)    Had pain in her knees     BP 136/80   Ht 5' (1.524 m)   Wt 153 lb (69.4 kg)   BMI 29.88 kg/m       No data to  display              No data to display              Objective:  Physical Exam:  Gen: NAD, comfortable in exam room  Left elbow/forearm: No deformity, obvious swelling or bruising. Full flexion but lacks 10 degrees extension.  4/5 strength elbow flexion and extension, limited by pain.  Pain felt deep in elbow. No tenderness to palpation epicondyles.  Mild tenderness over triceps tendon.  Tenderness within flexors of forearm. NVI distally. Collateral ligaments intact.   Limited MSK u/s left elbow:  Moderate elbow arthropathy with mild effusion.  Triceps tendon intact.  No abnormalities of common flexor or extensor tendons.  Biceps tendon intact.  Assessment & Plan:  1. Left arm pain - overuse forearm strain with elbow arthritis.  Start meloxicam.  Tylenol, topical medications, supplements reviewed.  Icing.  Home exercise program for forearm flexors.  F/u in 1 month.  Can consider intraarticular injection if pain is severe - discussed she's unlikely to regain full extension though with her level of arthritis.

## 2022-09-20 ENCOUNTER — Other Ambulatory Visit (HOSPITAL_COMMUNITY): Payer: Self-pay

## 2022-10-10 NOTE — Progress Notes (Unsigned)
Office Visit Note  Patient: Vicki Cook             Date of Birth: [birth year]           MRN: [id number]             PCP: Morene Crocker, MD Referring: Morene Crocker,* Visit Date: 10/11/2022   Subjective:  No chief complaint on file.   History of Present Illness: Vicki Cook is a 62 y.o. female here for follow up ***   Previous HPI 08/11/22 Vicki Cook is a 62 y.o. female here for follow up for seropositive RA after switching to Humira after last visit due to decreased efficacy on Enbrel.  So far she has taken 3 doses of medication.  No appreciable benefit in symptoms so far.  She is also taking ibuprofen as needed which is partially helpful.  Has symptoms involving hands wrists elbows but also significant knee swelling and pain with pressure or with any walking or prolonged standing.  Has not experienced significant injection reactions or any new major illness since starting Humira.  Her son accompanies her today.     Previous HPI 04/07/22 Vicki Cook is a 62 y.o. female here for follow up for seropositive RA on Enbrel 50 mg [place] weekly. Bilateral knee steroid injection at last visit due to inflammation and pain and this has been beneficial during the past few months.  She feels like the Enbrel is definitely helping when she had to interrupt treatment temporarily saw a large worsening of symptoms.  Since resuming this though does not seem to be as effective as before.  She notices waning efficacy she takes doses on Saturday but notices some increase in joint pain and stiffness starting by around Friday of each week.  She is also frustrated today due to some incorrect billing with account numbers from [facility] resulting in a large bill that was not processed through the orange card system properly.   Previous HPI 01/06/22 Vicki Cook is a 62 y.o. female here for follow up for seropositive RA on Enbrel 50 mg [place] weekly. She was unable to get her regular  labs drawn awaiting renewal of orange card for this year. She did restart the Enbrel since about 2 weeks ago but was off this for 2 months and had a lot of increased pain and swelling. Including finger swelling which she did not have before.   Previous HPI 06/15/2021 Vicki Cook is a 62 y.o. female here for follow up for seropositive RA on Enbrel 50 mg [place] weekly. She feels a good benefit although left knee pain and swelling is persistent. A small amount of elbow and wrist pain but no effusions like in her knee. She hs not suffered any serious infections or other complications.    Previous HPI 03/23/21 Vicki Cook is a 62 y.o. female here for follow up for seropositive RA on Enbrel 50 mg [place] weekly. She feels that symptoms are partially improved on the Enbrel. Hands and wrists are significantly improved. She continues having bilateral knee swelling especially on right side. She takes occasional tylenol for symptoms. She has gained at least 5 pounds over the past few months despite trying to stay active.   Previous HPI: 09/30/20 Vicki Cook is a 62 y.o. female here for evaluation with bilateral shoulder pain and history of rheumatoid arthritis and concern for possible PMR. She was previously seen with sports medicine with shoulder ultrasound consistent with inflammatory changes  and treated with intraarticular injection. She was previously evaluated for rheumatoid arthritis and recommended to start methotrexate apparently taking the medicine less than a month. Recent labs with significant inflammatory markers and having some jaw pain so concern of GCA and started prednisone taper since January but never saw vascular surgery for biopsy due to cost issues.   No Rheumatology ROS completed.   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   Rheumatoid  arthritis (HCC) 05/04/2015   Microcytic anemia 05/02/2014   Healthcare maintenance 02/25/2014   HTN (hypertension) 02/07/2014    Past Medical History:  Diagnosis Date   Bilateral knee pain 03/20/2014   Cough 07/22/2014   Eczema 12/11/2018   H/O pinworm infection    Hyperkeratosis of sole 11/26/2019   Hypertension 2012   Panic attack 07/19/2014   Rheumatoid arthritis (HCC) 2017   Right-sided back pain 04/06/2021   Shoulder weakness 05/08/2020   Temporal pain 05/08/2020    Family History  Problem Relation Age of Onset   Heart disease Mother    Hyperlipidemia Mother    Hypertension Mother    Heart disease Father    Hypertension Father    Diabetes Father    Healthy Brother    Healthy Brother    Healthy Son    Healthy Son    No past surgical history on file. Social History   Social History Narrative   Originally from    [place]   Has lived in [place]. Since 1999   2 young adult sons   Husband was a professor at [employer], but imprisoned for illegal internet activity.  Patient states he was unfairly targeted.  Tried to work with Suzie Portela to have his conviction overturned.  He was deported after release in recent years.   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   Immunization History  Administered Date(s) Administered   Influenza,inj,Quad PF,6+ Mos 02/25/2014, 02/19/2015, 03/24/2021   PFIZER(Purple Top)SARS-COV-2 Vaccination 07/27/2019, 08/20/2019   Pneumococcal Conjugate-13 09/30/2020     Objective: Vital Signs: There were no vitals taken for this visit.   Physical Exam   Musculoskeletal Exam: ***  CDAI Exam: CDAI Score: -- Patient Global: --; Provider Global: -- Swollen: --; Tender: -- Joint Exam 10/11/2022   No joint exam has been documented for this visit   There is currently no information documented on the homunculus. Go to the Rheumatology activity and complete the homunculus joint exam.  Investigation: No additional findings.  Imaging: [place]  LIMITED JOINT SPACE STRUCTURES UP LEFT  Result Date: 09/17/2022 Limited MSK u/s left elbow: Moderate elbow arthropathy with mild effusion. Triceps tendon intact. No abnormalities of common flexor or extensor tendons. Biceps tendon intact.    Recent Labs: Lab Results  Component Value Date   WBC 9.7 08/17/2022   HGB 10.9 (L) 08/17/2022   PLT 381 04/13/2022   NA 138 08/17/2022   K 4.2 08/17/2022   CL 101 08/17/2022   CO2 20 08/17/2022   GLUCOSE 175 (H) 08/17/2022   BUN 10 08/17/2022   CREATININE 0.54 (L) 08/17/2022   BILITOT <0.2 08/17/2022   ALKPHOS 107 08/17/2022   AST 19 08/17/2022   ALT 22 08/17/2022   PROT 8.4 08/17/2022   ALBUMIN 3.8 08/17/2022   CALCIUM 8.9 08/17/2022   GFRAA 127 05/07/2020   QFTBGOLD Negative 11/11/2015   QFTBGOLDPLUS Negative  01/05/2022    Speciality Comments: Labs need to be drawn from [facility] - Indigent care fund  Procedures:  No procedures performed Allergies: Asa [aspirin] and Lisinopril   Assessment / Plan:     Visit Diagnoses: No diagnosis found.  ***  Orders: No orders of the defined types were placed in this encounter.  No orders of the defined types were placed in this encounter.    Follow-Up Instructions: No follow-ups on file.   Fuller Plan, MD  Note - This record has been created using [employer].  Chart creation errors have been sought, but may not always  have been located. Such creation errors do not reflect on  the standard of medical care.

## 2022-10-11 ENCOUNTER — Encounter: Payer: Self-pay | Admitting: Internal Medicine

## 2022-10-11 ENCOUNTER — Ambulatory Visit: Payer: Self-pay | Attending: Internal Medicine | Admitting: Internal Medicine

## 2022-10-11 VITALS — BP 128/82 | HR 84 | Resp 16 | Ht 60.0 in | Wt 159.0 lb

## 2022-10-11 DIAGNOSIS — M05761 Rheumatoid arthritis with rheumatoid factor of right knee without organ or systems involvement: Secondary | ICD-10-CM

## 2022-10-11 DIAGNOSIS — M05762 Rheumatoid arthritis with rheumatoid factor of left knee without organ or systems involvement: Secondary | ICD-10-CM

## 2022-10-11 DIAGNOSIS — Z79899 Other long term (current) drug therapy: Secondary | ICD-10-CM

## 2022-10-12 ENCOUNTER — Telehealth: Payer: Self-pay | Admitting: *Deleted

## 2022-10-12 ENCOUNTER — Other Ambulatory Visit: Payer: Self-pay

## 2022-10-12 DIAGNOSIS — M2550 Pain in unspecified joint: Secondary | ICD-10-CM

## 2022-10-12 DIAGNOSIS — R5383 Other fatigue: Secondary | ICD-10-CM

## 2022-10-12 DIAGNOSIS — Z79899 Other long term (current) drug therapy: Secondary | ICD-10-CM

## 2022-10-12 DIAGNOSIS — M05761 Rheumatoid arthritis with rheumatoid factor of right knee without organ or systems involvement: Secondary | ICD-10-CM

## 2022-10-12 NOTE — Telephone Encounter (Addendum)
Pt here with lab order from Dr Dimple Casey (rheumatology).  Due to lack of insurance-pt comes here for labs. CMA will place order for labs (pcp to cosign) Lab results will also be sent to Dr Dimple Casey

## 2022-10-13 LAB — CMP14 + ANION GAP
ALT: 23 IU/L (ref 0–32)
AST: 24 IU/L (ref 0–40)
Albumin/Globulin Ratio: 1.1
Albumin: 3.9 g/dL (ref 3.8–4.9)
Alkaline Phosphatase: 97 IU/L (ref 44–121)
Anion Gap: 15 mmol/L (ref 10.0–18.0)
BUN/Creatinine Ratio: 35 — ABNORMAL HIGH (ref 9–23)
BUN: 16 mg/dL (ref 6–24)
Bilirubin Total: 0.2 mg/dL (ref 0.0–1.2)
CO2: 21 mmol/L (ref 20–29)
Calcium: 8.8 mg/dL (ref 8.7–10.2)
Chloride: 102 mmol/L (ref 96–106)
Creatinine, Ser: 0.46 mg/dL — ABNORMAL LOW (ref 0.57–1.00)
Globulin, Total: 3.6 g/dL (ref 1.5–4.5)
Glucose: 117 mg/dL — ABNORMAL HIGH (ref 70–99)
Potassium: 3.7 mmol/L (ref 3.5–5.2)
Sodium: 138 mmol/L (ref 134–144)
Total Protein: 7.5 g/dL (ref 6.0–8.5)
eGFR: 110 mL/min/{1.73_m2} (ref >59)

## 2022-10-13 LAB — CBC WITH DIFFERENTIAL/PLATELET
Basophils Absolute: 0 10*3/uL (ref 0.0–0.2)
Basos: 1 %
EOS (ABSOLUTE): 0.1 10*3/uL (ref 0.0–0.4)
Eos: 2 %
Hematocrit: 33.6 % — ABNORMAL LOW (ref 34.0–46.6)
Hemoglobin: 10.8 g/dL — ABNORMAL LOW (ref 11.1–15.9)
Immature Grans (Abs): 0 10*3/uL (ref 0.0–0.1)
Immature Granulocytes: 0 %
Lymphocytes Absolute: 2 10*3/uL (ref 0.7–3.1)
Lymphs: 29 %
MCH: 25.3 pg — ABNORMAL LOW (ref 26.6–33.0)
MCHC: 32.1 g/dL (ref 31.5–35.7)
MCV: 79 fL (ref 79–97)
Monocytes Absolute: 0.4 10*3/uL (ref 0.1–0.9)
Monocytes: 6 %
Neutrophils Absolute: 4.2 10*3/uL (ref 1.4–7.0)
Neutrophils: 62 %
Platelets: 310 10*3/uL (ref 150–450)
RBC: 4.27 x10E6/uL (ref 3.77–5.28)
RDW: 17.4 % — ABNORMAL HIGH (ref 11.7–15.4)
WBC: 6.7 10*3/uL (ref 3.4–10.8)

## 2022-10-13 LAB — SEDIMENTATION RATE: Sed Rate: 86 mm/hr — ABNORMAL HIGH (ref 0–40)

## 2022-10-13 NOTE — Progress Notes (Signed)
Sed rate is partially improved to 86 from 101 although still pretty high. Her blood count and metabolic panel are about the same, does not appear to be any problem with her continuing both rinvoq and meloxicam daily.

## 2022-10-17 ENCOUNTER — Other Ambulatory Visit (HOSPITAL_COMMUNITY): Payer: Self-pay

## 2022-10-19 ENCOUNTER — Other Ambulatory Visit (HOSPITAL_COMMUNITY): Payer: Self-pay

## 2022-11-08 ENCOUNTER — Other Ambulatory Visit: Payer: Self-pay | Admitting: *Deleted

## 2022-11-08 DIAGNOSIS — M05761 Rheumatoid arthritis with rheumatoid factor of right knee without organ or systems involvement: Secondary | ICD-10-CM

## 2022-11-08 DIAGNOSIS — Z79899 Other long term (current) drug therapy: Secondary | ICD-10-CM

## 2022-11-08 MED ORDER — RINVOQ 15 MG PO TB24: 15.0000 mg | ORAL_TABLET | Freq: Every day | ORAL | 0 refills | Status: DC

## 2022-11-08 NOTE — Telephone Encounter (Signed)
Last Fill: 09/01/2022  Labs: 10/12/2022 Sed rate is partially improved to 86 from 101 although still pretty high. Her blood count and metabolic panel are about the same, does not appear to be any problem with her continuing both rinvoq and meloxicam daily.   TB Gold: 01/05/2022   Negative   Next Visit: 01/10/2023  Last Visit: 10/11/2022  IO:[id number] arthritis involving both knees with positive rheumatoid factor   Current Dose per office note 10/11/2022: rinvoq 15 mg daily.   Okay to refill Rinvoq?

## 2022-11-22 ENCOUNTER — Other Ambulatory Visit: Payer: Self-pay | Admitting: Student

## 2022-11-22 DIAGNOSIS — I10 Essential (primary) hypertension: Secondary | ICD-10-CM

## 2022-11-23 ENCOUNTER — Other Ambulatory Visit (HOSPITAL_COMMUNITY): Payer: Self-pay

## 2022-11-23 MED ORDER — AMLODIPINE BESYLATE 5 MG PO TABS
5.0000 mg | ORAL_TABLET | Freq: Every day | ORAL | 3 refills | Status: DC
Start: 2022-11-23 — End: 2023-06-08
  Filled 2022-11-23 – 2022-12-21 (×2): qty 30, 30d supply, fill #0
  Filled 2023-01-27: qty 30, 30d supply, fill #1
  Filled 2023-03-08: qty 30, 30d supply, fill #2
  Filled 2023-04-24 – 2023-05-09 (×2): qty 30, 30d supply, fill #3

## 2022-11-24 ENCOUNTER — Other Ambulatory Visit: Payer: Self-pay

## 2022-11-24 DIAGNOSIS — M05761 Rheumatoid arthritis with rheumatoid factor of right knee without organ or systems involvement: Secondary | ICD-10-CM

## 2022-11-24 DIAGNOSIS — Z79899 Other long term (current) drug therapy: Secondary | ICD-10-CM

## 2022-11-24 MED ORDER — RINVOQ 15 MG PO TB24
15.0000 mg | ORAL_TABLET | Freq: Every day | ORAL | 0 refills | Status: DC
Start: 2022-11-24 — End: 2023-02-04

## 2022-11-24 NOTE — Telephone Encounter (Signed)
Refill request received via fax from [facility] for Rinvoq.  After reviewing the patient's chart,  prescription was sent on 11/08/2022 as a no print. Resent prescription today as normal.

## 2022-12-03 ENCOUNTER — Other Ambulatory Visit (HOSPITAL_COMMUNITY): Payer: Self-pay

## 2022-12-14 ENCOUNTER — Other Ambulatory Visit (HOSPITAL_COMMUNITY): Payer: Self-pay

## 2022-12-14 ENCOUNTER — Other Ambulatory Visit: Payer: Self-pay

## 2022-12-14 ENCOUNTER — Other Ambulatory Visit: Payer: Self-pay | Admitting: Internal Medicine

## 2022-12-14 DIAGNOSIS — I10 Essential (primary) hypertension: Secondary | ICD-10-CM

## 2022-12-14 MED ORDER — LOSARTAN POTASSIUM 50 MG PO TABS
75.0000 mg | ORAL_TABLET | Freq: Every day | ORAL | 0 refills | Status: DC
Start: 2022-12-14 — End: 2023-04-08
  Filled 2022-12-14: qty 45, 30d supply, fill #0
  Filled 2023-01-27: qty 45, 30d supply, fill #1
  Filled 2023-03-08: qty 45, 30d supply, fill #2

## 2022-12-21 ENCOUNTER — Other Ambulatory Visit: Payer: Self-pay | Admitting: Family Medicine

## 2022-12-22 ENCOUNTER — Other Ambulatory Visit (HOSPITAL_COMMUNITY): Payer: Self-pay

## 2022-12-22 ENCOUNTER — Other Ambulatory Visit: Payer: Self-pay | Admitting: Internal Medicine

## 2022-12-22 MED ORDER — MELOXICAM 15 MG PO TABS
15.0000 mg | ORAL_TABLET | Freq: Every day | ORAL | 0 refills | Status: DC | PRN
  Filled 2022-12-22: qty 30, 30d supply, fill #0

## 2022-12-22 NOTE — Telephone Encounter (Signed)
Patient contacted the office to request a medication refill.   1. Name of Medication: Meloxicam   2. How are you currently taking this medication (dosage and times per day)? As needed   3. What pharmacy would you like for that to be sent to? [facility]

## 2022-12-22 NOTE — Telephone Encounter (Signed)
Last Fill: 09/15/2022  Labs: 10/12/2022 Sed rate is partially improved to 86 from 101 although still pretty high. Her blood count and metabolic panel are about the same, does not appear to be any problem with her continuing both rinvoq and meloxicam daily.   Next Visit: 01/10/2023  Last Visit: 10/11/2022  DX:  Rheumatoid arthritis involving both knees with positive rheumatoid factor   Current Dose per office note 10/11/2022: decreasing the meloxicam either to only as needed or to 7.5 mg daily   Okay to refill Meloxicam?

## 2022-12-23 ENCOUNTER — Other Ambulatory Visit (HOSPITAL_COMMUNITY): Payer: Self-pay

## 2022-12-27 NOTE — Progress Notes (Signed)
Office Visit Note  Patient: Vicki Cook             Date of Birth: [birth year]           MRN: [id number]             PCP: Morene Crocker, MD Referring: Morene Crocker,* Visit Date: 01/10/2023   Subjective:  Follow-up (Patient states she has a rash on her face. )   History of Present Illness: Vicki Cook is a 62 y.o. female here for follow up for seropositive RA on rinvoq 15 mg daily also now on meloxicam 15 mg daily PRN.  She still experiences significant joint pain and stiffness in multiple areas unless she also takes the meloxicam.  When taking both Rinvoq and meloxicam she feels symptoms are pretty well-controlled.  She still has limited mobility in the left elbow and she has swelling at the base of the right thumb that comes and goes.  Knees are painful frequently at the back of the knee.  In the past month has a new facial rash this is itchy.  No involvement anywhere else.  She tried application of topical triamcinolone 0.1% that did not help.  Previous HPI 10/11/2022 Vicki Cook is a 62 y.o. female here for follow up for seropositive RA on rinvoq 15 mg daily also now on meloxicam 15 mg daily.  Symptoms doing much better compared to our visit in April.  Took prednisone as directed immediately after last visit with partial benefit but symptoms subsequently worsened again.  For the first week or 2 after starting Rinvoq felt symptoms getting worse.  She sought Dr. Herbert Moors in sports medicine clinic and started on meloxicam with significant improvement in joint pain and swelling.  Now on both medications for about 3 weeks and feels her symptoms are doing a lot better.  Decreased pain and stiffness and swelling in multiple areas although not completely gone.  So far did not notice any intolerance or trouble taking the medications.   Previous HPI 08/11/22 Vicki Cook is a 62 y.o. female here for follow up for seropositive RA after switching to Humira after last  visit due to decreased efficacy on Enbrel.  So far she has taken 3 doses of medication.  No appreciable benefit in symptoms so far.  She is also taking ibuprofen as needed which is partially helpful.  Has symptoms involving hands wrists elbows but also significant knee swelling and pain with pressure or with any walking or prolonged standing.  Has not experienced significant injection reactions or any new major illness since starting Humira.  Her son accompanies her today.   Previous HPI 04/07/22 Vicki Cook is a 62 y.o. female here for follow up for seropositive RA on Enbrel 50 mg [place] weekly. Bilateral knee steroid injection at last visit due to inflammation and pain and this has been beneficial during the past few months.  She feels like the Enbrel is definitely helping when she had to interrupt treatment temporarily saw a large worsening of symptoms.  Since resuming this though does not seem to be as effective as before.  She notices waning efficacy she takes doses on Saturday but notices some increase in joint pain and stiffness starting by around Friday of each week.  She is also frustrated today due to some incorrect billing with account numbers from [facility] resulting in a large bill that was not processed through the orange card system properly.   Previous HPI 01/06/22 Vicki Fray  Cook is a 62 y.o. female here for follow up for seropositive RA on Enbrel 50 mg [place] weekly. She was unable to get her regular labs drawn awaiting renewal of orange card for this year. She did restart the Enbrel since about 2 weeks ago but was off this for 2 months and had a lot of increased pain and swelling. Including finger swelling which she did not have before.   Previous HPI 06/15/2021 Vicki Cook is a 62 y.o. female here for follow up for seropositive RA on Enbrel 50 mg [place] weekly. She feels a good benefit although left knee pain and swelling is persistent. A small amount of elbow and wrist pain but no effusions  like in her knee. She hs not suffered any serious infections or other complications.    Previous HPI 03/23/21 Vicki Cook is a 62 y.o. female here for follow up for seropositive RA on Enbrel 50 mg [place] weekly. She feels that symptoms are partially improved on the Enbrel. Hands and wrists are significantly improved. She continues having bilateral knee swelling especially on right side. She takes occasional tylenol for symptoms. She has gained at least 5 pounds over the past few months despite trying to stay active.   Previous HPI: 09/30/20 Vicki Cook is a 62 y.o. female here for evaluation with bilateral shoulder pain and history of rheumatoid arthritis and concern for possible PMR. She was previously seen with sports medicine with shoulder ultrasound consistent with inflammatory changes and treated with intraarticular injection. She was previously evaluated for rheumatoid arthritis and recommended to start methotrexate apparently taking the medicine less than a month. Recent labs with significant inflammatory markers and having some jaw pain so concern of GCA and started prednisone taper since January but never saw vascular surgery for biopsy due to cost issues.   Review of Systems  Constitutional:  Negative for fatigue.  HENT:  Negative for mouth sores and mouth dryness.   Eyes:  Negative for dryness.  Respiratory:  Negative for shortness of breath.   Cardiovascular:  Negative for chest pain and palpitations.  Gastrointestinal:  Negative for blood in stool, constipation and diarrhea.  Endocrine: Negative for increased urination.  Genitourinary:  Positive for involuntary urination.  Musculoskeletal:  Positive for joint pain, joint pain, joint swelling and morning stiffness. Negative for gait problem, myalgias, muscle weakness, muscle tenderness and myalgias.  Skin:  Positive for rash. Negative for color change, hair loss and sensitivity to sunlight.  Allergic/Immunologic: Negative for  susceptible to infections.  Neurological:  Negative for dizziness and headaches.  Hematological:  Negative for swollen glands.  Psychiatric/Behavioral:  Negative for depressed mood and sleep disturbance. The patient is not nervous/anxious.     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   Rheumatoid arthritis (HCC) 05/04/2015   Microcytic anemia 05/02/2014   Healthcare maintenance 02/25/2014   HTN (hypertension) 02/07/2014    Past Medical History:  Diagnosis Date   Bilateral knee pain 03/20/2014   Cough 07/22/2014   Eczema 12/11/2018   H/O pinworm infection    Hyperkeratosis of sole 11/26/2019   Hypertension 2012   Panic attack 07/19/2014   Rheumatoid arthritis (HCC) 2017   Right-sided back pain 04/06/2021   Shoulder weakness 05/08/2020   Temporal pain 05/08/2020    Family History  Problem Relation Age of  Onset   Heart disease Mother    Hyperlipidemia Mother    Hypertension Mother    Heart disease Father    Hypertension Father    Diabetes Father    Healthy Brother    Healthy Brother    Healthy Son    Healthy Son    History reviewed. No pertinent surgical history. Social History   Social History Narrative   Originally from    [place]   Has lived in [place]. Since 1999   2 young adult sons   Husband was a professor at [employer], but imprisoned for illegal internet activity.  Patient states he was unfairly targeted.  Tried to work with [employer]ie Portela to have his conviction overturned.  He was deported after release in recent years.   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   Immunization History  Administered Date(s) Administered   Influenza,inj,Quad PF,6+ Mos 02/25/2014, 02/19/2015, 03/24/2021   PFIZER(Purple Top)SARS-COV-2 Vaccination 07/27/2019, 08/20/2019   Pneumococcal Conjugate-13  09/30/2020     Objective: Vital Signs: BP 136/85 (BP Location: Left Arm, Patient Position: Sitting, Cuff Size: Normal)   Pulse 87   Resp 14   Ht 5' (1.524 m)   Wt 162 lb (73.5 kg)   BMI 31.64 kg/m    Physical Exam Eyes:     Conjunctiva/sclera: Conjunctivae normal.  Cardiovascular:     Rate and Rhythm: Normal rate and regular rhythm.  Pulmonary:     Effort: Pulmonary effort is normal.     Breath sounds: Normal breath sounds.  Lymphadenopathy:     Cervical: No cervical adenopathy.  Skin:    General: Skin is warm and dry.     Findings: Rash present.     Comments: Facial rash with erythema and very mildly raised papules extending from just between eyebrows down in the nasolabial folds and perioral, no vesicles or umbilication no scaling  Neurological:     Mental Status: She is alert.  Psychiatric:        Mood and Affect: Mood normal.      Musculoskeletal Exam:  Shoulders full ROM no tenderness or swelling Elbows left worse than right limited extension range of motion, no palpable swelling Wrists severely limited mobility on both sides, no palpable effusions Fingers full range of motion no tenderness to pressure palpable swelling Knees full ROM, tenderness to pressure right knee joint line and posterior swelling   Investigation: No additional findings.  Imaging: No results found.  Recent Labs: Lab Results  Component Value Date   WBC 6.7 10/12/2022   HGB 10.8 (L) 10/12/2022   PLT 310 10/12/2022   NA 138 10/12/2022   K 3.7 10/12/2022   CL 102 10/12/2022   CO2 21 10/12/2022   GLUCOSE 117 (H) 10/12/2022   BUN 16 10/12/2022   CREATININE 0.46 (L) 10/12/2022   BILITOT 0.2 10/12/2022   ALKPHOS 97 10/12/2022   AST 24 10/12/2022   ALT 23 10/12/2022   PROT 7.5 10/12/2022   ALBUMIN 3.9 10/12/2022   CALCIUM 8.8 10/12/2022   GFRAA 127 05/07/2020   QFTBGOLD Negative 11/11/2015   QFTBGOLDPLUS Negative 01/05/2022    Speciality Comments: Labs need to be drawn from [facility]  - Indigent care fund  Procedures:  No procedures performed Allergies: Asa [aspirin] and Lisinopril   Assessment / Plan:     Visit Diagnoses: Rheumatoid arthritis involving both knees with positive rheumatoid factor (HCC)  Inflammatory arthritis appears pretty well-controlled today but anytime she skips meloxicam if symptoms  become more severe.  I am not sure if the Rinvoq is being very highly effective here or if we should consider alternate agents.  Symptoms do appear adequately controlled when on combination plan on rechecking her sedimentation rate this was previously remain very high in the 80s.  Could consider switch to Orencia as a novel mechanism of action it does not have any contraindication.  For now continue Rinvoq 15 mg daily and meloxicam 15 mg daily as needed.  High risk medication use - rinvoq 15 mg daily, ideally could try decreasing the meloxicam either to only as needed or to 7.5 mg daily  Checking CBC CMP and QuantiFERON for medication monitoring on continued long-term use of Rinvoq.  No serious interval infections.  Lab orders placed for collection with internal medicine center.  Facial rash  Not sure about the new mildly erythematous papular facial rash present for a few weeks.  Is not at all consistent with shingles.  Could be possible atopic dermatitis or drug eruption although nonresponsive to steroids.  Would also be unusual timing since we did not change any medicines for several months before onset.  If she is having drug reaction based eruption would be supported by high eosinophil count on lab result that this does not exclude it.  Okay to monitor for now could try a longer duration of low potency topical steroid.  Orders: No orders of the defined types were placed in this encounter.  No orders of the defined types were placed in this encounter.    Follow-Up Instructions: Return in about 3 months (around 04/11/2023) for RA on UPA/NSAID f/u 3mos.   Fuller Plan, MD  Note - This record has been created using [employer].  Chart creation errors have been sought, but may not always  have been located. Such creation errors do not reflect on  the standard of medical care.

## 2023-01-10 ENCOUNTER — Ambulatory Visit: Payer: Self-pay | Attending: Internal Medicine | Admitting: Internal Medicine

## 2023-01-10 ENCOUNTER — Encounter: Payer: Self-pay | Admitting: Internal Medicine

## 2023-01-10 VITALS — BP 136/85 | HR 87 | Resp 14 | Ht 60.0 in | Wt 162.0 lb

## 2023-01-10 DIAGNOSIS — M05762 Rheumatoid arthritis with rheumatoid factor of left knee without organ or systems involvement: Secondary | ICD-10-CM

## 2023-01-10 DIAGNOSIS — R21 Rash and other nonspecific skin eruption: Secondary | ICD-10-CM

## 2023-01-10 DIAGNOSIS — Z79899 Other long term (current) drug therapy: Secondary | ICD-10-CM

## 2023-01-10 DIAGNOSIS — M05761 Rheumatoid arthritis with rheumatoid factor of right knee without organ or systems involvement: Secondary | ICD-10-CM

## 2023-01-11 ENCOUNTER — Other Ambulatory Visit: Payer: Self-pay | Admitting: Internal Medicine

## 2023-01-11 ENCOUNTER — Other Ambulatory Visit: Payer: Self-pay

## 2023-01-11 DIAGNOSIS — M05761 Rheumatoid arthritis with rheumatoid factor of right knee without organ or systems involvement: Secondary | ICD-10-CM

## 2023-01-11 DIAGNOSIS — M05762 Rheumatoid arthritis with rheumatoid factor of left knee without organ or systems involvement: Secondary | ICD-10-CM

## 2023-01-11 DIAGNOSIS — Z79899 Other long term (current) drug therapy: Secondary | ICD-10-CM

## 2023-01-11 NOTE — Addendum Note (Signed)
Addended by: Bufford Spikes on: 01/11/2023 02:22 PM   Modules accepted: Orders

## 2023-01-12 LAB — CMP14 + ANION GAP
ALT: 21 IU/L (ref 0–32)
AST: 21 IU/L (ref 0–40)
Albumin: 4 g/dL (ref 3.8–4.9)
Alkaline Phosphatase: 110 IU/L (ref 44–121)
Anion Gap: 14 mmol/L (ref 10.0–18.0)
BUN/Creatinine Ratio: 32 — ABNORMAL HIGH (ref 12–28)
BUN: 22 mg/dL (ref 8–27)
Bilirubin Total: <0.2 mg/dL (ref 0.0–1.2)
CO2: 22 mmol/L (ref 20–29)
Calcium: 9 mg/dL (ref 8.7–10.3)
Chloride: 100 mmol/L (ref 96–106)
Creatinine, Ser: 0.68 mg/dL (ref 0.57–1.00)
Globulin, Total: 3.8 g/dL (ref 1.5–4.5)
Glucose: 83 mg/dL (ref 70–99)
Potassium: 4 mmol/L (ref 3.5–5.2)
Sodium: 136 mmol/L (ref 134–144)
Total Protein: 7.8 g/dL (ref 6.0–8.5)
eGFR: 100 mL/min/{1.73_m2} (ref >59)

## 2023-01-12 LAB — CBC WITH DIFFERENTIAL/PLATELET
Basophils Absolute: 0.1 10*3/uL (ref 0.0–0.2)
Basos: 1 %
EOS (ABSOLUTE): 0.1 10*3/uL (ref 0.0–0.4)
Eos: 2 %
Hematocrit: 34.9 % (ref 34.0–46.6)
Hemoglobin: 11.4 g/dL (ref 11.1–15.9)
Immature Grans (Abs): 0 10*3/uL (ref 0.0–0.1)
Immature Granulocytes: 0 %
Lymphocytes Absolute: 2.3 10*3/uL (ref 0.7–3.1)
Lymphs: 32 %
MCH: 27.7 pg (ref 26.6–33.0)
MCHC: 32.7 g/dL (ref 31.5–35.7)
MCV: 85 fL (ref 79–97)
Monocytes Absolute: 0.5 10*3/uL (ref 0.1–0.9)
Monocytes: 7 %
Neutrophils Absolute: 4.2 10*3/uL (ref 1.4–7.0)
Neutrophils: 58 %
Platelets: 340 10*3/uL (ref 150–450)
RBC: 4.11 x10E6/uL (ref 3.77–5.28)
RDW: 14.3 % (ref 11.7–15.4)
WBC: 7.2 10*3/uL (ref 3.4–10.8)

## 2023-01-12 LAB — SEDIMENTATION RATE: Sed Rate: 65 mm/h — ABNORMAL HIGH (ref 0–40)

## 2023-01-13 NOTE — Progress Notes (Signed)
Sedimentation rate of 65 this is partially improved from 86 3 months ago and 101 before that. Blood count and metabolic panel are normal no problem from the rinvoq or from the meloxicam. With symptoms and inflammatory markers improving I recommend continuing the current medication for now.

## 2023-01-15 LAB — QUANTIFERON-TB GOLD PLUS
QuantiFERON Mitogen Value: >10 [IU]/mL
QuantiFERON Nil Value: 0.06 [IU]/mL
QuantiFERON TB1 Ag Value: 0.07 [IU]/mL
QuantiFERON TB2 Ag Value: 0.07 [IU]/mL
QuantiFERON-TB Gold Plus: NEGATIVE

## 2023-01-28 ENCOUNTER — Other Ambulatory Visit (HOSPITAL_COMMUNITY): Payer: Self-pay

## 2023-01-28 ENCOUNTER — Other Ambulatory Visit: Payer: Self-pay

## 2023-02-03 ENCOUNTER — Other Ambulatory Visit: Payer: Self-pay | Admitting: Internal Medicine

## 2023-02-04 ENCOUNTER — Other Ambulatory Visit (HOSPITAL_COMMUNITY): Payer: Self-pay

## 2023-02-04 ENCOUNTER — Other Ambulatory Visit: Payer: Self-pay | Admitting: *Deleted

## 2023-02-04 DIAGNOSIS — M05761 Rheumatoid arthritis with rheumatoid factor of right knee without organ or systems involvement: Secondary | ICD-10-CM

## 2023-02-04 DIAGNOSIS — Z79899 Other long term (current) drug therapy: Secondary | ICD-10-CM

## 2023-02-04 MED ORDER — MELOXICAM 15 MG PO TABS
15.0000 mg | ORAL_TABLET | Freq: Every day | ORAL | 0 refills | Status: DC | PRN
  Filled 2023-02-04: qty 90, 90d supply, fill #0

## 2023-02-04 MED ORDER — RINVOQ 15 MG PO TB24
15.0000 mg | ORAL_TABLET | Freq: Every day | ORAL | 0 refills | Status: DC
Start: 2023-02-04 — End: 2023-07-01

## 2023-02-04 NOTE — Telephone Encounter (Signed)
Refill request received via fax from [facility] for Rinvoq  Last Fill: 11/24/2022  Labs: 01/11/2023 Sedimentation rate of 65 this is partially improved from 86 3 months ago and 101 before that. Blood count and metabolic panel are normal no problem from the rinvoq or from the meloxicam. With symptoms and inflammatory markers improving I recommend continuing the current medication for now.   TB Gold: 01/11/2023 Neg    Next Visit: 04/11/2023  Last Visit: 01/10/2023  [id number] arthritis involving both knees with positive rheumatoid factor   Current Dose per office note 01/10/2023: Rinvoq 15 mg daily   Okay to refill Rinvoq?

## 2023-02-04 NOTE — Progress Notes (Signed)
Okay to continue the meloxicam as well for now as she has no evidence of any problem on her metabolic panel. I will refill Rx for this. We can continue to monitor with regular follow up as planned.

## 2023-02-04 NOTE — Telephone Encounter (Signed)
Last Fill: 12/22/2022  Labs: 01/11/2023 BUN/Creat. Ratio 32  Next Visit: 04/11/2023  Last Visit: 01/10/2023  DX: Rheumatoid arthritis involving both knees with positive rheumatoid factor   Current Dose per office note 01/10/2023: meloxicam 15 mg daily as needed.   Okay to refill Meloxicam?

## 2023-02-07 ENCOUNTER — Other Ambulatory Visit (HOSPITAL_COMMUNITY): Payer: Self-pay

## 2023-03-09 ENCOUNTER — Other Ambulatory Visit (HOSPITAL_COMMUNITY): Payer: Self-pay

## 2023-03-09 ENCOUNTER — Other Ambulatory Visit: Payer: Self-pay | Admitting: Student

## 2023-03-09 ENCOUNTER — Other Ambulatory Visit: Payer: Self-pay

## 2023-03-09 DIAGNOSIS — I10 Essential (primary) hypertension: Secondary | ICD-10-CM

## 2023-03-09 MED ORDER — LOSARTAN POTASSIUM 50 MG PO TABS
75.0000 mg | ORAL_TABLET | Freq: Every day | ORAL | 0 refills | Status: DC
Start: 2023-03-09 — End: 2023-08-29
  Filled 2023-03-09: qty 45, 30d supply, fill #0
  Filled 2023-05-08: qty 45, 30d supply, fill #1
  Filled 2023-06-16: qty 30, 20d supply, fill #2

## 2023-03-29 NOTE — Progress Notes (Unsigned)
Office Visit Note  Patient: Vicki Cook             Date of Birth: [birth year]           MRN: [id number]             PCP: Morene Crocker, MD Referring: Morene Crocker,* Visit Date: 04/11/2023   Subjective:  No chief complaint on file.   History of Present Illness: Vicki Cook is a 62 y.o. 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    Previous HPI 01/10/2023 Vicki Cook is a 62 y.o. female here for follow up for seropositive RA on rinvoq 15 mg daily also now on meloxicam 15 mg daily PRN.  She still experiences significant joint pain and stiffness in multiple areas unless she also takes the meloxicam.  When taking both Rinvoq and meloxicam she feels symptoms are pretty well-controlled.  She still has limited mobility in the left elbow and she has swelling at the base of the right thumb that comes and goes.  Knees are painful frequently at the back of the knee.  In the past month has a new facial rash this is itchy.  No involvement anywhere else.  She tried application of topical triamcinolone 0.1% that did not help.   Previous HPI 10/11/2022 Vicki Cook is a 62 y.o. female here for follow up for seropositive RA on rinvoq 15 mg daily also now on meloxicam 15 mg daily.  Symptoms doing much better compared to our visit in April.  Took prednisone as directed immediately after last visit with partial benefit but symptoms subsequently worsened again.  For the first week or 2 after starting Rinvoq felt symptoms getting worse.  She sought Dr. Herbert Moors in sports medicine clinic and started on meloxicam with significant improvement in joint pain and swelling.  Now on both medications for about 3 weeks and feels her symptoms are doing a lot better.  Decreased pain and stiffness and swelling in multiple areas although not completely gone.  So far did not notice any intolerance or trouble taking the medications.    Previous HPI 08/11/22 Vicki Cook is a 62 y.o. female here for follow up for seropositive RA after switching to Humira after last visit due to decreased efficacy on Enbrel.  So far she has taken 3 doses of medication.  No appreciable benefit in symptoms so far.  She is also taking ibuprofen as needed which is partially helpful.  Has symptoms involving hands wrists elbows but also significant knee swelling and pain with pressure or with any walking or prolonged standing.  Has not experienced significant injection reactions or any new major illness since starting Humira.  Her son accompanies her today.   Previous HPI 04/07/22 Vicki Cook is a 63 y.o. female here for follow up for seropositive RA on Enbrel 50 mg [place] weekly. Bilateral knee steroid injection at last visit due to inflammation and pain and this has been beneficial during the past few months.  She feels like the Enbrel is definitely helping when she had to interrupt treatment temporarily saw a large worsening of symptoms.  Since resuming this though does not seem to be as effective as before.  She notices waning efficacy she takes doses on Saturday but notices some increase in joint pain and stiffness starting by around Friday of each week.  She is also frustrated today due to some incorrect  billing with account numbers from [employer] resulting in a large bill that was not processed through the orange card system properly.   Previous HPI 01/06/22 Vicki Cook is a 62 y.o. female here for follow up for seropositive RA on Enbrel 50 mg [place] weekly. She was unable to get her regular labs drawn awaiting renewal of orange card for this year. She did restart the Enbrel since about 2 weeks ago but was off this for 2 months and had a lot of increased pain and swelling. Including finger swelling which she did not have before.   Previous HPI 06/15/2021 Vicki Cook is a 62 y.o. female here for follow up for seropositive RA on Enbrel 50 mg [place]  weekly. She feels a good benefit although left knee pain and swelling is persistent. A small amount of elbow and wrist pain but no effusions like in her knee. She hs not suffered any serious infections or other complications.    Previous HPI 03/23/21 Vicki Cook is a 62 y.o. female here for follow up for seropositive RA on Enbrel 50 mg [place] weekly. She feels that symptoms are partially improved on the Enbrel. Hands and wrists are significantly improved. She continues having bilateral knee swelling especially on right side. She takes occasional tylenol for symptoms. She has gained at least 5 pounds over the past few months despite trying to stay active.   Previous HPI: 09/30/20 Vicki Cook is a 62 y.o. female here for evaluation with bilateral shoulder pain and history of rheumatoid arthritis and concern for possible PMR. She was previously seen with sports medicine with shoulder ultrasound consistent with inflammatory changes and treated with intraarticular injection. She was previously evaluated for rheumatoid arthritis and recommended to start methotrexate apparently taking the medicine less than a month. Recent labs with significant inflammatory markers and having some jaw pain so concern of GCA and started prednisone taper since January but never saw vascular surgery for biopsy due to cost issues.     No Rheumatology ROS completed.   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   Rheumatoid arthritis (HCC) 05/04/2015   Microcytic anemia 05/02/2014   Healthcare maintenance 02/25/2014   HTN (hypertension) 02/07/2014    Past Medical History:  Diagnosis Date   Bilateral knee pain 03/20/2014   Cough 07/22/2014   Eczema 12/11/2018   H/O pinworm infection    Hyperkeratosis of sole 11/26/2019   Hypertension  2012   Panic attack 07/19/2014   Rheumatoid arthritis (HCC) 2017   Right-sided back pain 04/06/2021   Shoulder weakness 05/08/2020   Temporal pain 05/08/2020    Family History  Problem Relation Age of Onset   Heart disease Mother    Hyperlipidemia Mother    Hypertension Mother    Heart disease Father    Hypertension Father    Diabetes Father    Healthy Brother    Healthy Brother    Healthy Son    Healthy Son    No past surgical history on file. Social History   Social History Narrative   Originally from    [place]   Has lived in [place]. Since 1999   2 young adult sons   Husband was a professor at [employer], but imprisoned for illegal internet activity.  Patient states he was unfairly targeted.  Tried to work with Suzie Portela to have his conviction overturned.  He was deported after release in recent years.   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   Immunization History  Administered Date(s) Administered   Influenza,inj,Quad PF,6+ Mos 02/25/2014, 02/19/2015, 03/24/2021   PFIZER(Purple Top)SARS-COV-2 Vaccination 07/27/2019, 08/20/2019   Pneumococcal Conjugate-13 09/30/2020     Objective: Vital Signs: There were no vitals taken for this visit.   Physical Exam   Musculoskeletal Exam: ***  CDAI Exam: CDAI Score: -- Patient Global: --; Provider Global: -- Swollen: --; Tender: -- Joint Exam 04/11/2023   No joint exam has been documented for this visit   There is currently no information documented on the homunculus. Go to the Rheumatology activity and complete the homunculus joint exam.  Investigation: No additional findings.  Imaging: No results found.  Recent Labs: Lab Results  Component Value Date   WBC 7.2 01/11/2023   HGB 11.4 01/11/2023   PLT 340 01/11/2023   NA 136 01/11/2023   K 4.0 01/11/2023   CL 100 01/11/2023   CO2 22 01/11/2023   GLUCOSE 83 01/11/2023   BUN 22 01/11/2023   CREATININE 0.68 01/11/2023   BILITOT <0.2 01/11/2023    ALKPHOS 110 01/11/2023   AST 21 01/11/2023   ALT 21 01/11/2023   PROT 7.8 01/11/2023   ALBUMIN 4.0 01/11/2023   CALCIUM 9.0 01/11/2023   GFRAA 127 05/07/2020   QFTBGOLD Negative 11/11/2015   QFTBGOLDPLUS Negative 01/11/2023    Speciality Comments: Labs need to be drawn from [facility] care fund  Procedures:  No procedures performed Allergies: Asa [aspirin] and Lisinopril   Assessment / Plan:     Visit Diagnoses: No diagnosis found.  ***  Orders: No orders of the defined types were placed in this encounter.  No orders of the defined types were placed in this encounter.    Follow-Up Instructions: No follow-ups on file.   Metta Clines, RT  Note - This record has been created using [employer].  Chart creation errors have been sought, but may not always  have been located. Such creation errors do not reflect on  the standard of medical care.

## 2023-04-11 ENCOUNTER — Ambulatory Visit: Payer: Self-pay | Admitting: Internal Medicine

## 2023-04-11 DIAGNOSIS — M05761 Rheumatoid arthritis with rheumatoid factor of right knee without organ or systems involvement: Secondary | ICD-10-CM

## 2023-04-11 DIAGNOSIS — Z79899 Other long term (current) drug therapy: Secondary | ICD-10-CM

## 2023-04-11 DIAGNOSIS — R21 Rash and other nonspecific skin eruption: Secondary | ICD-10-CM

## 2023-04-25 ENCOUNTER — Other Ambulatory Visit (HOSPITAL_COMMUNITY): Payer: Self-pay

## 2023-05-05 ENCOUNTER — Other Ambulatory Visit (HOSPITAL_COMMUNITY): Payer: Self-pay

## 2023-05-09 ENCOUNTER — Other Ambulatory Visit (HOSPITAL_COMMUNITY): Payer: Self-pay

## 2023-05-09 ENCOUNTER — Other Ambulatory Visit: Payer: Self-pay

## 2023-06-07 ENCOUNTER — Other Ambulatory Visit (HOSPITAL_COMMUNITY): Payer: Self-pay

## 2023-06-08 ENCOUNTER — Other Ambulatory Visit: Payer: Self-pay | Admitting: Student

## 2023-06-08 DIAGNOSIS — I10 Essential (primary) hypertension: Secondary | ICD-10-CM

## 2023-06-09 ENCOUNTER — Other Ambulatory Visit (HOSPITAL_COMMUNITY): Payer: Self-pay

## 2023-06-09 MED ORDER — AMLODIPINE BESYLATE 5 MG PO TABS
5.0000 mg | ORAL_TABLET | Freq: Every day | ORAL | 3 refills | Status: DC
  Filled 2023-06-09: qty 30, 30d supply, fill #0
  Filled 2023-07-17 – 2023-07-28 (×2): qty 30, 30d supply, fill #1
  Filled 2023-08-25: qty 30, 30d supply, fill #2
  Filled 2023-09-12 (×2): qty 30, 30d supply, fill #3
  Filled 2023-10-30: qty 30, 30d supply, fill #4
  Filled 2023-12-15: qty 30, 30d supply, fill #5
  Filled 2024-01-26: qty 30, 30d supply, fill #6
  Filled 2024-02-26: qty 30, 30d supply, fill #7

## 2023-06-10 ENCOUNTER — Telehealth: Payer: Self-pay | Admitting: Internal Medicine

## 2023-06-10 NOTE — Telephone Encounter (Signed)
 Patient called stating she receives her Rinvoq  medication through [employer] and was told they are missing the doctor's section of her application.  Patient requested a return call to let her know if she can pick up a sample.

## 2023-06-13 ENCOUNTER — Telehealth: Payer: Self-pay | Admitting: Pharmacist

## 2023-06-13 NOTE — Telephone Encounter (Signed)
 Patient states she completed her portion of Rinvoq  PAP application. Prescriber form placed in Dr. Ricky Charter folder for signature.  Patient advised that she is overdue for labs. Last labs updated on 01/11/2023. She states that if she has bloodwork completed now, she will be unable to afford cost. I advised that I will need consent from Dr. Rodell Citrin before provide sample (FYI - samples rae 30 days supply)  She states that she has been out of medication for 2 weeks. She reapplied for [facility] assistance last week - I am not sure of turnaround time of this  Geraldene Kleine, PharmD, MPH, BCPS, CPP Clinical Pharmacist (Rheumatology and Pulmonology)

## 2023-06-13 NOTE — Telephone Encounter (Signed)
 Patient states she completed her portion of Rinvoq  PAP application. Prescriber form placed in Dr. Ricky Charter folder for signature.  Patient is uninsured (no PA or ins card copy)  Vicki Cook, PharmD, MPH, BCPS, CPP Clinical Pharmacist (Rheumatology and Pulmonology)

## 2023-06-15 NOTE — Telephone Encounter (Signed)
Sample of Rinvoq labeled in cabinet and lab orders placed in cabinet. Left VM for patient to advise.  She will not be able to receive any further Rinvoq samples or prescription refill until she completes labs.  Advised in VM that Dr. Dimple Casey believes as long as her financial assistance renewal is in process through [facility], she should be able to get labs completed  Chesley Mires, PharmD, MPH, BCPS, CPP Clinical Pharmacist (Rheumatology and Pulmonology)

## 2023-06-15 NOTE — Telephone Encounter (Signed)
Oka to provide with 30 day sample.  She would really need labs before continuing any longer than 1 month though, since that would be 6 months since we checked it.  I thought with [employer] if they get labs through the hospital while waiting it can be applied to their bill retroactively after getting approved. Or is that not the case?

## 2023-06-15 NOTE — Telephone Encounter (Signed)
Medication Samples have been provided to the patient.  Drug name: Rinvoq       Strength: 15 mg        Qty: 1  LOT: [id number]  Exp.Date: 01/23/2024  Dosing instructions: Take 1 tablet by mouth daily.  Patient given prescription for lab orders.

## 2023-06-16 ENCOUNTER — Other Ambulatory Visit: Payer: Self-pay | Admitting: *Deleted

## 2023-06-16 ENCOUNTER — Other Ambulatory Visit (HOSPITAL_COMMUNITY): Payer: Self-pay

## 2023-06-16 ENCOUNTER — Other Ambulatory Visit: Payer: Self-pay | Admitting: Internal Medicine

## 2023-06-16 ENCOUNTER — Other Ambulatory Visit: Payer: Self-pay

## 2023-06-16 DIAGNOSIS — I10 Essential (primary) hypertension: Secondary | ICD-10-CM

## 2023-06-16 DIAGNOSIS — M05761 Rheumatoid arthritis with rheumatoid factor of right knee without organ or systems involvement: Secondary | ICD-10-CM

## 2023-06-16 DIAGNOSIS — M05762 Rheumatoid arthritis with rheumatoid factor of left knee without organ or systems involvement: Secondary | ICD-10-CM

## 2023-06-16 MED ORDER — LOSARTAN POTASSIUM 50 MG PO TABS: 75.0000 mg | ORAL_TABLET | Freq: Every day | ORAL | 1 refills | Status: DC

## 2023-06-16 NOTE — Progress Notes (Signed)
Placed lab orders for CBC w. Diff, CME, and ESR per rheumatology request. Please forward results to Dr. Dimple Casey with [facility].

## 2023-06-16 NOTE — Telephone Encounter (Signed)
Submitted Patient Assistance RENEWAL Application to [employer] for [facility] along with provider portion and medication list. Will update patient when we receive a response.  Phone: [phone] Fax: [phone]

## 2023-06-17 LAB — CMP14 + ANION GAP
ALT: 22 [IU]/L (ref 0–32)
AST: 21 [IU]/L (ref 0–40)
Albumin: 3.8 g/dL (ref 3.8–4.9)
Alkaline Phosphatase: 98 [IU]/L (ref 44–121)
Anion Gap: 12 mmol/L (ref 10.0–18.0)
BUN/Creatinine Ratio: 24 (ref 12–28)
BUN: 12 mg/dL (ref 8–27)
Bilirubin Total: 0.3 mg/dL (ref 0.0–1.2)
CO2: 23 mmol/L (ref 20–29)
Calcium: 8.9 mg/dL (ref 8.7–10.3)
Chloride: 103 mmol/L (ref 96–106)
Creatinine, Ser: 0.49 mg/dL — ABNORMAL LOW (ref 0.57–1.00)
Globulin, Total: 3.6 g/dL (ref 1.5–4.5)
Glucose: 159 mg/dL — ABNORMAL HIGH (ref 70–99)
Potassium: 3.7 mmol/L (ref 3.5–5.2)
Sodium: 138 mmol/L (ref 134–144)
Total Protein: 7.4 g/dL (ref 6.0–8.5)
eGFR: 108 mL/min/{1.73_m2} (ref >59)

## 2023-06-17 LAB — CBC WITH DIFFERENTIAL/PLATELET
Basophils Absolute: 0 10*3/uL (ref 0.0–0.2)
Basos: 1 %
EOS (ABSOLUTE): 0.2 10*3/uL (ref 0.0–0.4)
Eos: 2 %
Hematocrit: 34.2 % (ref 34.0–46.6)
Hemoglobin: 10.9 g/dL — ABNORMAL LOW (ref 11.1–15.9)
Immature Grans (Abs): 0 10*3/uL (ref 0.0–0.1)
Immature Granulocytes: 0 %
Lymphocytes Absolute: 1.8 10*3/uL (ref 0.7–3.1)
Lymphs: 26 %
MCH: 27.2 pg (ref 26.6–33.0)
MCHC: 31.9 g/dL (ref 31.5–35.7)
MCV: 85 fL (ref 79–97)
Monocytes Absolute: 0.5 10*3/uL (ref 0.1–0.9)
Monocytes: 7 %
Neutrophils Absolute: 4.5 10*3/uL (ref 1.4–7.0)
Neutrophils: 64 %
Platelets: 344 10*3/uL (ref 150–450)
RBC: 4.01 x10E6/uL (ref 3.77–5.28)
RDW: 13.5 % (ref 11.7–15.4)
WBC: 7 10*3/uL (ref 3.4–10.8)

## 2023-06-17 LAB — SEDIMENTATION RATE: Sed Rate: 82 mm/h — ABNORMAL HIGH (ref 0–40)

## 2023-06-17 NOTE — Telephone Encounter (Signed)
Received a fax from  [employer] regarding an approval for [facility] patient assistance from 06/17/2023 to 06/16/2024. Approval letter sent to scan center.  Phone: [phone] Fax: [phone]  Chesley Mires, PharmD, MPH, BCPS, CPP Clinical Pharmacist (Rheumatology and Pulmonology)

## 2023-07-01 ENCOUNTER — Other Ambulatory Visit: Payer: Self-pay

## 2023-07-01 DIAGNOSIS — Z79899 Other long term (current) drug therapy: Secondary | ICD-10-CM

## 2023-07-01 DIAGNOSIS — M05761 Rheumatoid arthritis with rheumatoid factor of right knee without organ or systems involvement: Secondary | ICD-10-CM

## 2023-07-01 MED ORDER — RINVOQ 15 MG PO TB24: 15.0000 mg | ORAL_TABLET | Freq: Every day | ORAL | 0 refills | Status: DC

## 2023-07-01 NOTE — Telephone Encounter (Signed)
 Refill request received via fax from [employer] for Rinvoq  Last Fill: 02/04/2023  Labs: 06/16/2023 Glucose 159 Creatinine 0.49 Hemoglobin 10.9  08/17/2022 Lipid Panel WNL  TB Gold: 01/11/2023 Negative   Next Visit: 08/29/2023  Last Visit: 01/10/2023  [id number] arthritis involving both knees with positive rheumatoid factor (HCC)   Current Dose per office note 01/10/2023: continue Rinvoq 15 mg daily   Contacted the patient to advise she is due to update her Lipid panel. Patient states she just had labs done. Patient states she will deal with the lab at the follow up visit.   Okay to refill Rinvoq?

## 2023-07-01 NOTE — Progress Notes (Signed)
 Sedimentation rate is significantly increased at 82.  Blood count and kidney and liver function are normal no problem for continuing the Rinvoq. The inflammation number is a bit worse compared to last time we checked.  Is she still taking the meloxicam?  If not I think she should be okay to resume or increase this again.

## 2023-07-01 NOTE — Progress Notes (Signed)
 Thanks

## 2023-07-18 ENCOUNTER — Other Ambulatory Visit (HOSPITAL_COMMUNITY): Payer: Self-pay

## 2023-07-28 ENCOUNTER — Other Ambulatory Visit (HOSPITAL_COMMUNITY): Payer: Self-pay

## 2023-07-28 ENCOUNTER — Other Ambulatory Visit: Payer: Self-pay | Admitting: Student

## 2023-07-28 DIAGNOSIS — I10 Essential (primary) hypertension: Secondary | ICD-10-CM

## 2023-07-28 MED ORDER — LOSARTAN POTASSIUM 50 MG PO TABS
75.0000 mg | ORAL_TABLET | Freq: Every day | ORAL | 0 refills | Status: DC
  Filled 2023-07-28: qty 45, 30d supply, fill #0
  Filled 2023-08-25: qty 45, 30d supply, fill #1
  Filled 2023-10-30: qty 45, 30d supply, fill #2

## 2023-07-28 NOTE — Telephone Encounter (Signed)
 Received after-hours page. Returned call to patient. Patient requests refill for her losartan 75 mg daily. She has been taking it regularly and will run out tomorrow. Recent labs in 06/2023 showed normal renal function and stable electrolytes. Will approve refill for losartan to [facility]. Also advised patient to schedule [facility] f/u appt since her last visit was in 08/2022. States she is working on the [employer]. Patient verbalizes understanding of plan and all questions addressed.

## 2023-07-29 ENCOUNTER — Other Ambulatory Visit (HOSPITAL_COMMUNITY): Payer: Self-pay

## 2023-07-29 ENCOUNTER — Encounter (HOSPITAL_COMMUNITY): Payer: Self-pay

## 2023-07-29 IMAGING — DX DG CHEST 2V
2 series · 2 of 2 positions shown · non-contrast
Comparison: CT 02/20/2020.  Chest x-ray 03/13/2015.

CLINICAL DATA: Chronic cough. Rheumatoid arthritis. TNF inhibitor
treatment.

EXAM:
CHEST - 2 VIEW

[chest pa]
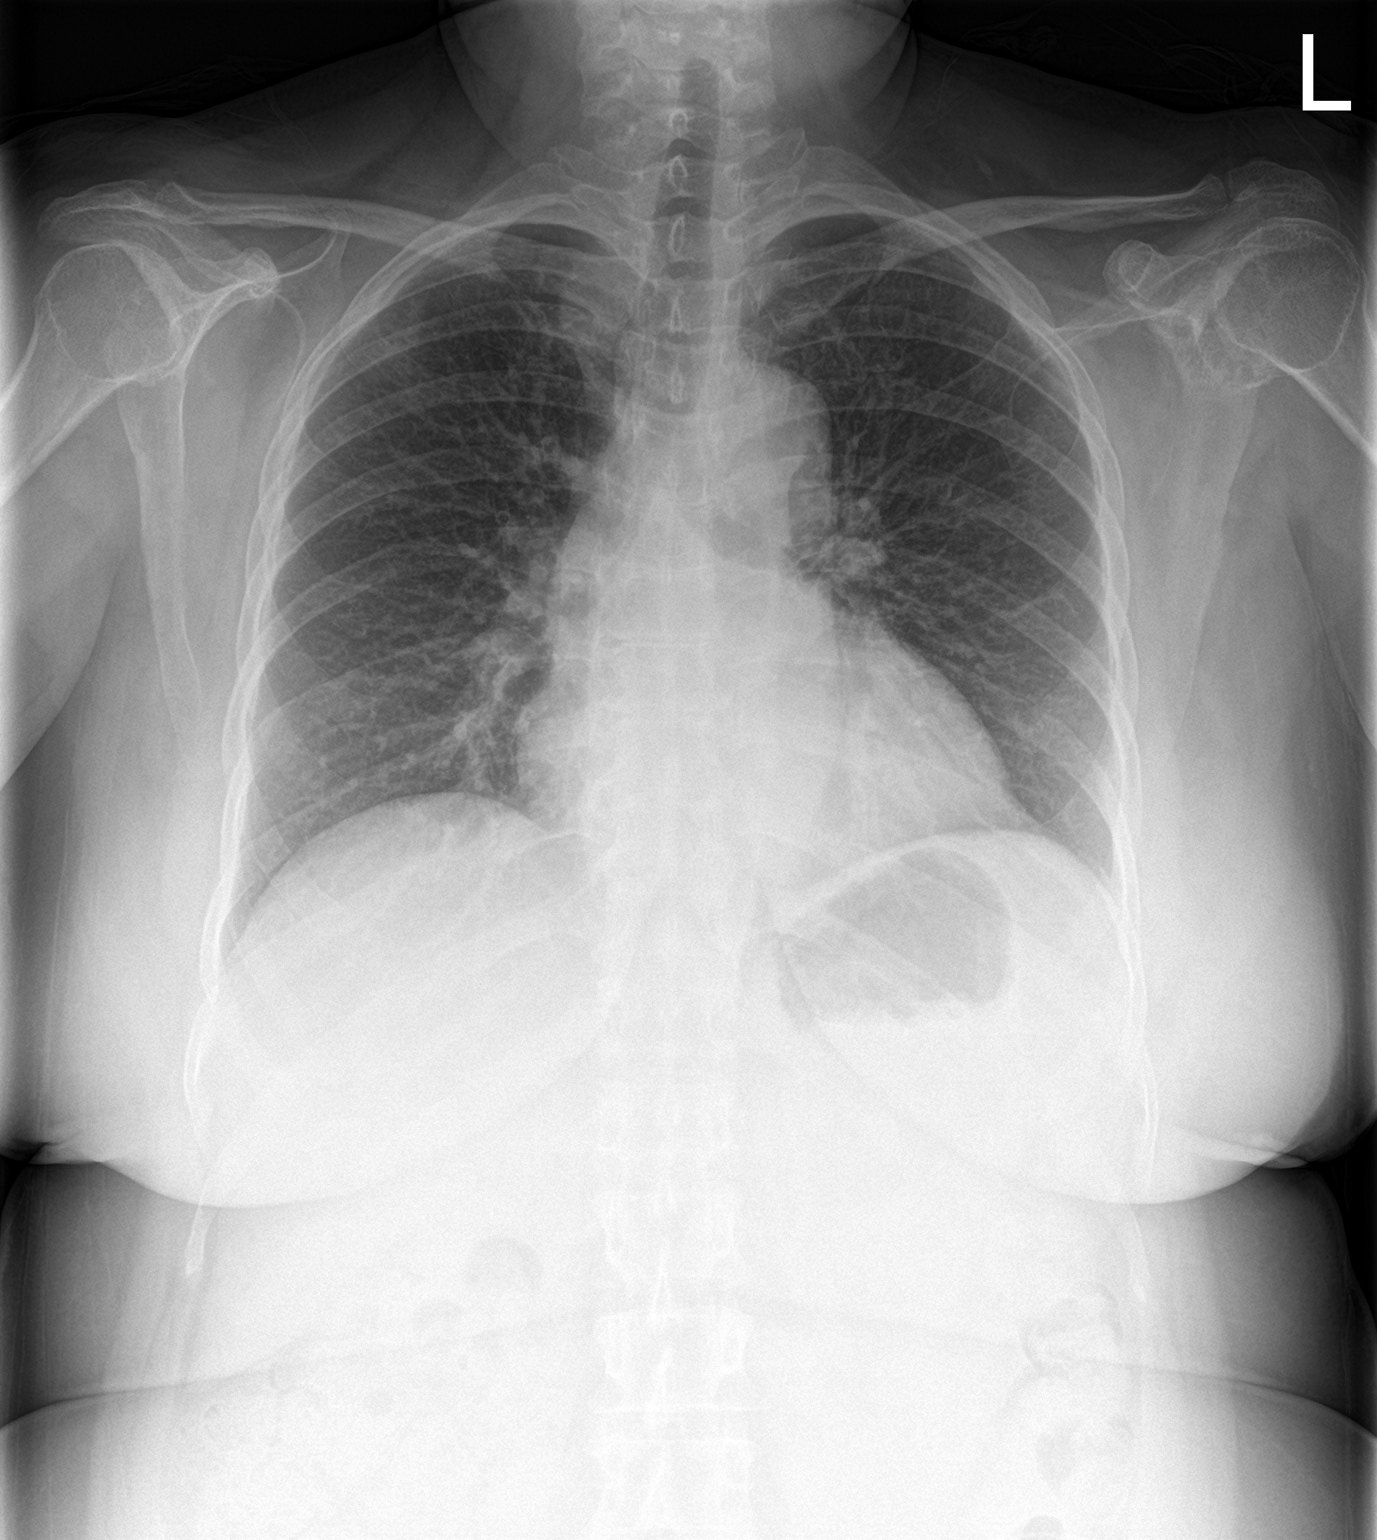

[chest lat]
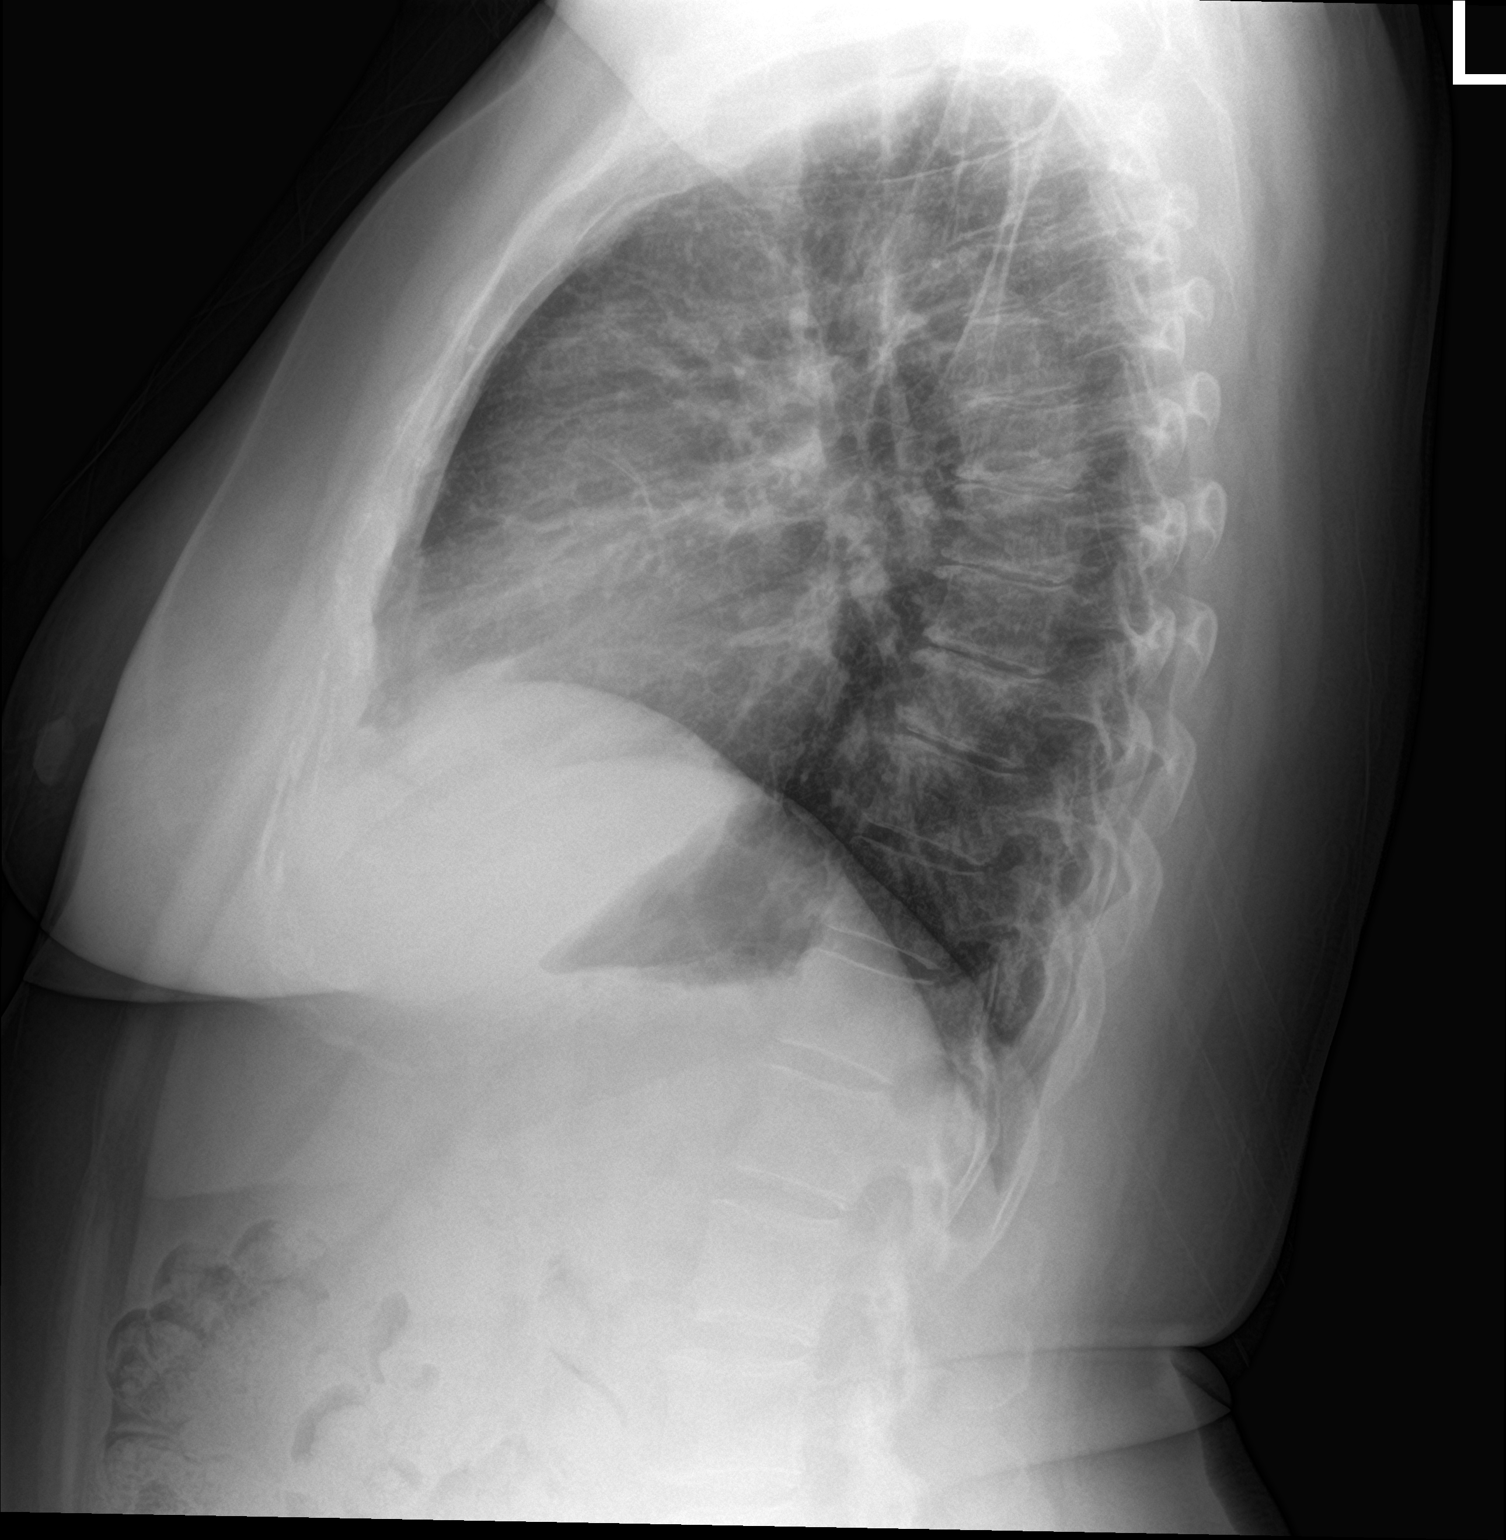

[2 of 2 positions shown; findings below may reference images not displayed]

FINDINGS: Borderline cardiomegaly. No pulmonary venous congestion. Low lung
volumes. No focal infiltrate. No pleural effusion or pneumothorax.
Degenerative change thoracic spine. Degenerative changes both
shoulders.
IMPRESSION: 1.  Borderline cardiomegaly.  No pulmonary venous congestion.

2.  Low lung volumes.  No acute pulmonary infiltrate.

## 2023-08-18 NOTE — Progress Notes (Unsigned)
 Office Visit Note  Patient: Vicki Cook             Date of Birth: Feb 27, 1961           MRN: [id number]             PCP: Cathey Clunes, MD Referring: Cathey Clunes,* Visit Date: 08/29/2023   Subjective:  Other (Patient reports bilateral knee pain and swelling )   History of Present Illness:   Discussed the use of AI scribe software for clinical note transcription with the patient, who gave verbal consent to proceed.  History of Present Illness   Vicki Cook is a 63 y.o. 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   She experiences swelling in her knees, particularly the right knee, which impairs her ability to perform daily activities such as standing for long periods and rising from a seated position. She has difficulty walking, especially in the mornings when it is cold, and struggles to get up from low cars or SUVs.  She has been using Rinvoq  for her condition but discontinued meloxicam  due to stomach irritation. She applies Voltaren  gel as needed, which she finds helpful but does not use daily. She recently took ibuprofen  due to severe pain, although she typically avoids it because it can elevate her blood pressure and cause stomach upset.  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 Her work and home responsibilities are impacted by her knee pain and swelling.     Previous HPI 01/10/2023 Vicki Cook is a 63 y.o. female here for follow up for seropositive RA on rinvoq  15 mg daily also now on meloxicam  15 mg daily PRN.  She still experiences significant joint pain and stiffness in multiple areas unless she also takes the meloxicam .  When taking both Rinvoq  and meloxicam  she feels symptoms are pretty well-controlled.  She still has limited mobility in the left elbow and she has swelling at the base of the right thumb that comes and goes.  Knees are  painful frequently at the back of the knee.  In the past month has a new facial rash this is itchy.  No involvement anywhere else.  She tried application of topical triamcinolone  0.1% that did not help.   Previous HPI 10/11/2022 Vicki Cook is a 63 y.o. female here for follow up for seropositive RA on rinvoq  15 mg daily also now on meloxicam  15 mg daily.  Symptoms doing much better compared to our visit in April.  Took prednisone  as directed immediately after last visit with partial benefit but symptoms subsequently worsened again.  For the first week or 2 after starting Rinvoq  felt symptoms getting worse.  She sought Dr. Rockland Churn in sports medicine clinic and started on meloxicam  with significant improvement in joint pain and swelling.  Now on both medications for about 3 weeks and feels her symptoms are doing a lot better.  Decreased pain and stiffness and swelling in multiple areas although not completely gone.  So far did not notice any intolerance or trouble taking the medications.   Previous HPI 08/11/22 Vicki Cook is a 63 y.o. female here for follow up for seropositive RA after switching to Humira  after last visit due to decreased efficacy on Enbrel .  So far she has taken 3 doses of medication.  No appreciable benefit in symptoms so far.  She is also taking ibuprofen   as needed which is partially helpful.  Has symptoms involving hands wrists elbows but also significant knee swelling and pain with pressure or with any walking or prolonged standing.  Has not experienced significant injection reactions or any new major illness since starting Humira .  Her son accompanies her today.   Previous HPI 04/07/22 Vicki Cook is a 63 y.o. female here for follow up for seropositive RA on Enbrel  50 mg [place] weekly. Bilateral knee steroid injection at last visit due to inflammation and pain and this has been beneficial during the past few months.  She feels like the Enbrel  is definitely helping when she  had to interrupt treatment temporarily saw a large worsening of symptoms.  Since resuming this though does not seem to be as effective as before.  She notices waning efficacy she takes doses on Saturday but notices some increase in joint pain and stiffness starting by around Friday of each week.  She is also frustrated today due to some incorrect billing with account numbers from [employer] resulting in a large bill that was not processed through the orange card system properly.   Previous HPI 01/06/22 Vicki Cook is a 63 y.o. female here for follow up for seropositive RA on Enbrel  50 mg [place] weekly. She was unable to get her regular labs drawn awaiting renewal of orange card for this year. She did restart the Enbrel  since about 2 weeks ago but was off this for 2 months and had a lot of increased pain and swelling. Including finger swelling which she did not have before.   Previous HPI 06/15/2021 Vicki Cook is a 63 y.o. female here for follow up for seropositive RA on Enbrel  50 mg [place] weekly. She feels a good benefit although left knee pain and swelling is persistent. A small amount of elbow and wrist pain but no effusions like in her knee. She hs not suffered any serious infections or other complications.    Previous HPI 03/23/21 Vicki Cook is a 63 y.o. female here for follow up for seropositive RA on Enbrel  50 mg  weekly. She feels that symptoms are partially improved on the Enbrel . Hands and wrists are significantly improved. She continues having bilateral knee swelling especially on right side. She takes occasional tylenol for symptoms. She has gained at least 5 pounds over the past few months despite trying to stay active.   Previous HPI: 09/30/20 Vicki Cook is a 63 y.o. female here for evaluation with bilateral shoulder pain and history of rheumatoid arthritis and concern for possible PMR. She was previously seen with sports medicine with shoulder ultrasound consistent with  inflammatory changes and treated with intraarticular injection. She was previously evaluated for rheumatoid arthritis and recommended to start methotrexate apparently taking the medicine less than a month. Recent labs with significant inflammatory markers and having some jaw pain so concern of GCA and started prednisone  taper since January but never saw vascular surgery for biopsy due to cost issues.   Review of Systems  Constitutional:  Negative for fatigue.  HENT:  Positive for mouth dryness. Negative for mouth sores.   Eyes:  Negative for dryness.  Respiratory:  Negative for shortness of breath.   Cardiovascular:  Negative for chest pain and palpitations.  Gastrointestinal:  Negative for blood in stool, constipation and diarrhea.  Endocrine: Positive for increased urination.  Genitourinary:  Negative for involuntary urination.  Musculoskeletal:  Positive for joint pain, gait problem, joint pain, joint swelling, muscle weakness and morning  stiffness. Negative for myalgias, muscle tenderness and myalgias.  Skin:  Negative for color change, rash, hair loss and sensitivity to sunlight.  Allergic/Immunologic: Negative for susceptible to infections.  Neurological:  Negative for dizziness and headaches.  Hematological:  Negative for swollen glands.  Psychiatric/Behavioral:  Negative for depressed mood and sleep disturbance. The patient is not nervous/anxious.     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   Rheumatoid arthritis (HCC) 05/04/2015   Microcytic anemia 05/02/2014   Healthcare maintenance 02/25/2014   HTN (hypertension) 02/07/2014    Past Medical History:  Diagnosis Date   Bilateral knee pain 03/20/2014   Cough 07/22/2014   Eczema 12/11/2018   H/O pinworm infection    Hyperkeratosis of sole  11/26/2019   Hypertension 2012   Panic attack 07/19/2014   Rheumatoid arthritis (HCC) 2017   Right-sided back pain 04/06/2021   Shoulder weakness 05/08/2020   Temporal pain 05/08/2020    Family History  Problem Relation Age of Onset   Heart disease Mother    Hyperlipidemia Mother    Hypertension Mother    Heart disease Father    Hypertension Father    Diabetes Father    Healthy Brother    Healthy Brother    Healthy Son    Healthy Son    History reviewed. No pertinent surgical history. Social History   Social History Narrative   Originally from    [place]   Has lived in [place]. Since 1999   2 young adult sons   Husband was a professor at [employer], but imprisoned for illegal internet activity.  Patient states he was unfairly targeted.  Tried to work with [employer] to have his conviction overturned.  He was deported after release in recent years.   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   Immunization History  Administered Date(s) Administered   Influenza,inj,Quad PF,6+ Mos 02/25/2014, 02/19/2015, 03/24/2021   PFIZER(Purple Top)SARS-COV-2 Vaccination 07/27/2019, 08/20/2019   Pneumococcal Conjugate-13 09/30/2020     Objective: Vital Signs: BP 121/83 (BP Location: Left Arm, Patient Position: Sitting, Cuff Size: Normal)   Pulse 74   Resp 16   Ht 5' (1.524 m)   Wt 174 lb 9.6 oz (79.2 kg)   BMI 34.10 kg/m    Physical Exam Eyes:     Conjunctiva/sclera: Conjunctivae normal.  Cardiovascular:     Rate and Rhythm: Normal rate and regular rhythm.  Pulmonary:     Effort: Pulmonary effort is normal.     Breath sounds: Normal breath sounds.  Musculoskeletal:     Right lower leg: No edema.     Left lower leg: No edema.  Lymphadenopathy:     Cervical: No cervical adenopathy.  Skin:    General: Skin is warm and dry.     Findings: No rash.  Neurological:     Mental Status: She is alert.  Psychiatric:        Mood and Affect: Mood normal.      Musculoskeletal  Exam:  Shoulders full ROM no tenderness or swelling Bilateral elbow and wrist range of motion is restricted with some pain though no palpable synovitis Right worse than left bilateral wrist ROM limited Fingers full ROM tenderness to pressure worst at thumb MCP joints, no palpable swelling Bilateral knee swelling and tenderness to pressure,  right knee also on posterior side  Investigation: No additional findings.  Imaging: No results found.  Recent Labs: Lab Results  Component Value Date   WBC 7.0 06/16/2023   HGB 10.9 (L) 06/16/2023   PLT 344 06/16/2023   NA 138 06/16/2023   K 3.7 06/16/2023   CL 103 06/16/2023   CO2 23 06/16/2023   GLUCOSE 159 (H) 06/16/2023   BUN 12 06/16/2023   CREATININE 0.49 (L) 06/16/2023   BILITOT 0.3 06/16/2023   ALKPHOS 98 06/16/2023   AST 21 06/16/2023   ALT 22 06/16/2023   PROT 7.4 06/16/2023   ALBUMIN 3.8 06/16/2023   CALCIUM 8.9 06/16/2023   GFRAA 127 05/07/2020   QFTBGOLD Negative 11/11/2015   QFTBGOLDPLUS Negative 01/11/2023    Speciality Comments: Labs need to be drawn from [facility] Indigent care fund  Procedures:  No procedures performed Allergies: Asa [aspirin] and Lisinopril    Assessment / Plan:     Visit Diagnoses: Rheumatoid arthritis involving both knees with positive rheumatoid factor (HCC) - Plan: diclofenac  Sodium (VOLTAREN ) 1 % GEL, Upadacitinib  ER (RINVOQ ) 15 MG TB24, XR Hand 2 View Right, XR Hand 2 View Left, celecoxib (CELEBREX) 100 MG capsule  High risk medication use - Rinvoq  15 mg daily and meloxicam  15 mg daily as needed.  Facial rash  Chronic pain of both knees  High risk medication use  ***  Orders: Orders Placed This Encounter  Procedures   XR Hand 2 View Right   XR Hand 2 View Left   Meds ordered this encounter  Medications   diclofenac  Sodium (VOLTAREN ) 1 % GEL    Sig: Apply 2 g topically every 6 (six) hours as needed.    Dispense:  100 g    Refill:  1   Upadacitinib  ER (RINVOQ ) 15 MG TB24     Sig: Take 1 tablet (15 mg total) by mouth daily.    Dispense:  30 tablet    Refill:  0    Prescription Type::   Renewal   celecoxib (CELEBREX) 100 MG capsule    Sig: Take 1 capsule (100 mg total) by mouth daily as needed.    Dispense:  30 capsule    Refill:  1     Follow-Up Instructions: Return in about 3 months (around 11/28/2023) for RA on UPA/?Switch/COX f/u 3mos.   Matt Song, MD  Note - This record has been created using [employer].  Chart creation errors have been sought, but may not always  have been located. Such creation errors do not reflect on  the standard of medical care.

## 2023-08-26 ENCOUNTER — Other Ambulatory Visit (HOSPITAL_COMMUNITY): Payer: Self-pay

## 2023-08-29 ENCOUNTER — Ambulatory Visit: Payer: Self-pay

## 2023-08-29 ENCOUNTER — Encounter: Payer: Self-pay | Admitting: Internal Medicine

## 2023-08-29 ENCOUNTER — Other Ambulatory Visit: Payer: Self-pay | Admitting: *Deleted

## 2023-08-29 ENCOUNTER — Other Ambulatory Visit (HOSPITAL_COMMUNITY): Payer: Self-pay

## 2023-08-29 ENCOUNTER — Ambulatory Visit: Payer: Self-pay | Attending: Internal Medicine | Admitting: Internal Medicine

## 2023-08-29 VITALS — BP 121/83 | HR 74 | Resp 16 | Ht 60.0 in | Wt 174.6 lb

## 2023-08-29 DIAGNOSIS — M05761 Rheumatoid arthritis with rheumatoid factor of right knee without organ or systems involvement: Secondary | ICD-10-CM

## 2023-08-29 DIAGNOSIS — M05762 Rheumatoid arthritis with rheumatoid factor of left knee without organ or systems involvement: Secondary | ICD-10-CM

## 2023-08-29 DIAGNOSIS — M25562 Pain in left knee: Secondary | ICD-10-CM

## 2023-08-29 DIAGNOSIS — M25561 Pain in right knee: Secondary | ICD-10-CM

## 2023-08-29 DIAGNOSIS — Z79899 Other long term (current) drug therapy: Secondary | ICD-10-CM

## 2023-08-29 DIAGNOSIS — R21 Rash and other nonspecific skin eruption: Secondary | ICD-10-CM

## 2023-08-29 DIAGNOSIS — G8929 Other chronic pain: Secondary | ICD-10-CM

## 2023-08-29 MED ORDER — CELECOXIB 100 MG PO CAPS
100.0000 mg | ORAL_CAPSULE | Freq: Every day | ORAL | 1 refills | Status: DC | PRN
  Filled 2023-08-29 – 2023-09-12 (×2): qty 30, 30d supply, fill #0

## 2023-08-29 MED ORDER — RINVOQ 15 MG PO TB24: 15.0000 mg | ORAL_TABLET | Freq: Every day | ORAL | 0 refills | Status: DC

## 2023-08-29 MED ORDER — DICLOFENAC SODIUM 1 % EX GEL: 2.0000 g | Freq: Four times a day (QID) | CUTANEOUS | 1 refills | Status: DC | PRN

## 2023-09-09 ENCOUNTER — Other Ambulatory Visit (HOSPITAL_COMMUNITY): Payer: Self-pay

## 2023-09-12 ENCOUNTER — Other Ambulatory Visit (HOSPITAL_COMMUNITY): Payer: Self-pay

## 2023-09-14 ENCOUNTER — Other Ambulatory Visit: Payer: Self-pay | Admitting: Internal Medicine

## 2023-09-14 ENCOUNTER — Other Ambulatory Visit (HOSPITAL_COMMUNITY): Payer: Self-pay

## 2023-09-14 ENCOUNTER — Other Ambulatory Visit (INDEPENDENT_AMBULATORY_CARE_PROVIDER_SITE_OTHER): Payer: Self-pay

## 2023-09-14 DIAGNOSIS — Z79899 Other long term (current) drug therapy: Secondary | ICD-10-CM

## 2023-09-14 DIAGNOSIS — M05761 Rheumatoid arthritis with rheumatoid factor of right knee without organ or systems involvement: Secondary | ICD-10-CM

## 2023-09-14 NOTE — Progress Notes (Signed)
 Pt returns with lab orders from Dr. Rodell Citrin (rheumatology). Lab results will be sent to Dr. Rodell Citrin upon completion.

## 2023-09-14 NOTE — Addendum Note (Signed)
 Addended by: Manfred Seed on: 09/14/2023 10:13 AM   Modules accepted: Orders

## 2023-09-15 LAB — CMP14 + ANION GAP
ALT: 21 IU/L (ref 0–32)
AST: 24 IU/L (ref 0–40)
Albumin: 4.1 g/dL (ref 3.8–4.9)
Alkaline Phosphatase: 98 IU/L (ref 44–121)
Anion Gap: 15 mmol/L (ref 10.0–18.0)
BUN/Creatinine Ratio: 33 — ABNORMAL HIGH (ref 12–28)
BUN: 14 mg/dL (ref 8–27)
Bilirubin Total: 0.2 mg/dL (ref 0.0–1.2)
CO2: 20 mmol/L (ref 20–29)
Calcium: 9 mg/dL (ref 8.7–10.3)
Chloride: 104 mmol/L (ref 96–106)
Creatinine, Ser: 0.43 mg/dL — ABNORMAL LOW (ref 0.57–1.00)
Globulin, Total: 3.6 g/dL (ref 1.5–4.5)
Glucose: 116 mg/dL — ABNORMAL HIGH (ref 70–99)
Potassium: 3.8 mmol/L (ref 3.5–5.2)
Sodium: 139 mmol/L (ref 134–144)
Total Protein: 7.7 g/dL (ref 6.0–8.5)
eGFR: 111 mL/min/{1.73_m2} (ref >59)

## 2023-09-15 LAB — CBC WITH DIFFERENTIAL/PLATELET
Basophils Absolute: 0.1 10*3/uL (ref 0.0–0.2)
Basos: 1 %
EOS (ABSOLUTE): 0.1 10*3/uL (ref 0.0–0.4)
Eos: 2 %
Hematocrit: 38 % (ref 34.0–46.6)
Hemoglobin: 12.4 g/dL (ref 11.1–15.9)
Immature Grans (Abs): 0 10*3/uL (ref 0.0–0.1)
Immature Granulocytes: 0 %
Lymphocytes Absolute: 2.1 10*3/uL (ref 0.7–3.1)
Lymphs: 34 %
MCH: 28 pg (ref 26.6–33.0)
MCHC: 32.6 g/dL (ref 31.5–35.7)
MCV: 86 fL (ref 79–97)
Monocytes Absolute: 0.4 10*3/uL (ref 0.1–0.9)
Monocytes: 7 %
Neutrophils Absolute: 3.6 10*3/uL (ref 1.4–7.0)
Neutrophils: 56 %
Platelets: 334 10*3/uL (ref 150–450)
RBC: 4.43 x10E6/uL (ref 3.77–5.28)
RDW: 14.6 % (ref 11.7–15.4)
WBC: 6.3 10*3/uL (ref 3.4–10.8)

## 2023-09-15 LAB — LIPID PANEL
Chol/HDL Ratio: 3.1 ratio (ref 0.0–4.4)
Cholesterol, Total: 170 mg/dL (ref 100–199)
HDL: 55 mg/dL (ref >39)
LDL Chol Calc (NIH): 99 mg/dL (ref 0–99)
Triglycerides: 87 mg/dL (ref 0–149)
VLDL Cholesterol Cal: 16 mg/dL (ref 5–40)

## 2023-09-15 LAB — SEDIMENTATION RATE: Sed Rate: 77 mm/h — ABNORMAL HIGH (ref 0–40)

## 2023-10-24 ENCOUNTER — Other Ambulatory Visit: Payer: Self-pay | Admitting: *Deleted

## 2023-10-24 ENCOUNTER — Other Ambulatory Visit: Payer: Self-pay | Admitting: Internal Medicine

## 2023-10-24 ENCOUNTER — Other Ambulatory Visit (HOSPITAL_COMMUNITY): Payer: Self-pay

## 2023-10-24 DIAGNOSIS — M05761 Rheumatoid arthritis with rheumatoid factor of right knee without organ or systems involvement: Secondary | ICD-10-CM

## 2023-10-24 MED ORDER — RINVOQ 15 MG PO TB24
15.0000 mg | ORAL_TABLET | Freq: Every day | ORAL | 0 refills | Status: DC
Start: 2023-10-24 — End: 2023-11-21

## 2023-10-24 MED ORDER — CELECOXIB 100 MG PO CAPS
100.0000 mg | ORAL_CAPSULE | Freq: Every day | ORAL | 0 refills | Status: DC | PRN
Start: 2023-10-24 — End: 2023-11-21
  Filled 2023-10-24: qty 90, 90d supply, fill #0

## 2023-10-24 NOTE — Telephone Encounter (Signed)
 Patient left a voicemail requesting prescription refills for Celebrex  and Rinvoq  and requested a 3 month supply.

## 2023-10-24 NOTE — Telephone Encounter (Signed)
 Last Fill: 08/29/2023  Labs: 09/14/2023  Glucose 116 Creatinine 0.43 BUN/Creatinine Ratio 33  09/14/2023 Lipid Profile   TB Gold: 01/11/2023 Negative   Next Visit: 11/21/2023  Last Visit: 08/29/2023  IK:Myzlfjunpi arthritis involving both knees with positive rheumatoid factor (HCC)   Current Dose per office note 08/29/2023: Rinvoq  15 mg daily, Clebrex dose not mentioned  Okay to refill Rinvoq  and Celebrex ?

## 2023-10-31 ENCOUNTER — Other Ambulatory Visit (HOSPITAL_COMMUNITY): Payer: Self-pay

## 2023-11-08 NOTE — Progress Notes (Signed)
 Office Visit Note  Patient: Vicki Cook             Date of Birth: Apr 05, 1961           MRN: [id number]             PCP: Elnora Ip, MD Referring: Elnora Ip,* Visit Date: 11/21/2023   Subjective:  Follow-up (Patient states she had xrays and never heard anything. )   Discussed the use of AI scribe software for clinical note transcription with the patient, who gave verbal consent to proceed.  History of Present Illness   Vicki Cook is a 63 y.o. female here for follow up for seropositive RA on rinvoq  15 mg daily and Celebrex  100 mg daily.    She continues to take Rinvoq  and has recently switched from meloxicam  to celecoxib  to mitigate gastrointestinal side effects. Celecoxib  is used on 'bad days' or during increased physical activity or cold weather, and she finds it as effective as meloxicam .  She is concerned about potential weight gain with celecoxib , noting some bloating. She has stopped working out due to pain when walking and is currently doing yoga once a week, which she feels is insufficient. She is interested in aquatic exercises but has not yet pursued them.  She experiences pain primarily when engaging in activities such as climbing stairs or getting up from a seated position. Her hands hurt during certain activities.  She is interested in strengthening exercises for her legs, particularly targeting the quadriceps, and is considering using resistance bands for exercises that do not involve the knee. She experiences occasional pain in her legs.     Previous HPI 08/29/2023 Vicki Cook is a 63 y.o. 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   She experiences swelling in her knees, particularly the right knee, which impairs her ability to perform daily activities such as standing for long periods and rising from a seated position. She has difficulty walking, especially in the mornings when it is cold, and  struggles to get up from low cars or SUVs.   She has been using Rinvoq  for her condition but discontinued meloxicam  due to stomach irritation. She applies Voltaren  gel as needed, which she finds helpful but does not use daily. She recently took ibuprofen  due to severe pain, although she typically avoids it because it can elevate her blood pressure and cause stomach upset.   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 Her work and home responsibilities are impacted by her knee pain and swelling.    Previous HPI 01/10/2023 Vicki Cook is a 63 y.o. female here for follow up for seropositive RA on rinvoq  15 mg daily also now on meloxicam  15 mg daily PRN.  She still experiences significant joint pain and stiffness in multiple areas unless she also takes the meloxicam .  When taking both Rinvoq  and meloxicam  she feels symptoms are pretty well-controlled.  She still has limited mobility in the left elbow and she has swelling at the base of the right thumb that comes and goes.  Knees are painful frequently at the back of the knee.  In the past month has a new facial rash this is itchy.  No involvement anywhere else.  She tried application of topical triamcinolone  0.1% that did not help.   Previous HPI 10/11/2022 Vicki Cook is a 63 y.o. female here for follow  up for seropositive RA on rinvoq  15 mg daily also now on meloxicam  15 mg daily.  Symptoms doing much better compared to our visit in April.  Took prednisone  as directed immediately after last visit with partial benefit but symptoms subsequently worsened again.  For the first week or 2 after starting Rinvoq  felt symptoms getting worse.  She sought Dr. Franki in sports medicine clinic and started on meloxicam  with significant improvement in joint pain and swelling.  Now on both medications for about 3 weeks and feels her symptoms are doing a lot better.  Decreased pain and  stiffness and swelling in multiple areas although not completely gone.  So far did not notice any intolerance or trouble taking the medications.   Previous HPI 08/11/22 Vicki Cook is a 63 y.o. female here for follow up for seropositive RA after switching to Humira  after last visit due to decreased efficacy on Enbrel .  So far she has taken 3 doses of medication.  No appreciable benefit in symptoms so far.  She is also taking ibuprofen  as needed which is partially helpful.  Has symptoms involving hands wrists elbows but also significant knee swelling and pain with pressure or with any walking or prolonged standing.  Has not experienced significant injection reactions or any new major illness since starting Humira .  Her son accompanies her today.   Previous HPI 04/07/22 Vicki Cook is a 63 y.o. female here for follow up for seropositive RA on Enbrel  50 mg [place] weekly. Bilateral knee steroid injection at last visit due to inflammation and pain and this has been beneficial during the past few months.  She feels like the Enbrel  is definitely helping when she had to interrupt treatment temporarily saw a large worsening of symptoms.  Since resuming this though does not seem to be as effective as before.  She notices waning efficacy she takes doses on Saturday but notices some increase in joint pain and stiffness starting by around Friday of each week.  She is also frustrated today due to some incorrect billing with account numbers from [employer] resulting in a large bill that was not processed through the orange card system properly.   Previous HPI 01/06/22 Vicki Cook is a 63 y.o. female here for follow up for seropositive RA on Enbrel  50 mg [place] weekly. She was unable to get her regular labs drawn awaiting renewal of orange card for this year. She did restart the Enbrel  since about 2 weeks ago but was off this for 2 months and had a lot of increased pain and swelling. Including finger swelling which she  did not have before.   Previous HPI 06/15/2021 Vicki Cook is a 63 y.o. female here for follow up for seropositive RA on Enbrel  50 mg [place] weekly. She feels a good benefit although left knee pain and swelling is persistent. A small amount of elbow and wrist pain but no effusions like in her knee. She hs not suffered any serious infections or other complications.    Previous HPI 03/23/21 Vicki Cook is a 63 y.o. female here for follow up for seropositive RA on Enbrel  50 mg [place] weekly. She feels that symptoms are partially improved on the Enbrel . Hands and wrists are significantly improved. She continues having bilateral knee swelling especially on right side. She takes occasional tylenol for symptoms. She has gained at least 5 pounds over the past few months despite trying to stay active.   Previous HPI: 09/30/20 Vicki CHRISTELLA Harpin  is a 63 y.o. female here for evaluation with bilateral shoulder pain and history of rheumatoid arthritis and concern for possible PMR. She was previously seen with sports medicine with shoulder ultrasound consistent with inflammatory changes and treated with intraarticular injection. She was previously evaluated for rheumatoid arthritis and recommended to start methotrexate apparently taking the medicine less than a month. Recent labs with significant inflammatory markers and having some jaw pain so concern of GCA and started prednisone  taper since January but never saw vascular surgery for biopsy due to cost issues.   Review of Systems  Constitutional:  Negative for fatigue.  HENT:  Negative for mouth sores and mouth dryness.   Eyes:  Negative for dryness.  Respiratory:  Negative for shortness of breath.   Cardiovascular:  Negative for chest pain and palpitations.  Gastrointestinal:  Negative for blood in stool, constipation and diarrhea.  Endocrine: Negative for increased urination.  Genitourinary:  Positive for involuntary urination.  Musculoskeletal:   Positive for joint pain, joint pain, joint swelling, myalgias, muscle weakness and myalgias. Negative for gait problem, morning stiffness and muscle tenderness.  Skin:  Positive for sensitivity to sunlight. Negative for color change, rash and hair loss.  Allergic/Immunologic: Negative for susceptible to infections.  Neurological:  Negative for dizziness and headaches.  Hematological:  Negative for swollen glands.  Psychiatric/Behavioral:  Negative for depressed mood and sleep disturbance. The patient is not nervous/anxious.     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   Rheumatoid arthritis (HCC) 05/04/2015   Microcytic anemia 05/02/2014   Healthcare maintenance 02/25/2014   HTN (hypertension) 02/07/2014    Past Medical History:  Diagnosis Date   Bilateral knee pain 03/20/2014   Cough 07/22/2014   Eczema 12/11/2018   H/O pinworm infection    Hyperkeratosis of sole 11/26/2019   Hypertension 2012   Panic attack 07/19/2014   Rheumatoid arthritis (HCC) 2017   Right-sided back pain 04/06/2021   Shoulder weakness 05/08/2020   Temporal pain 05/08/2020    Family History  Problem Relation Age of Onset   Heart disease Mother    Hyperlipidemia Mother    Hypertension Mother    Heart disease Father    Hypertension Father    Diabetes Father    Healthy Brother    Healthy Brother    Healthy Son    Healthy Son    History reviewed. No pertinent surgical history. Social History   Social History Narrative   Originally from    [place]   Has lived in [place]. Since 1999   2 young adult sons   Husband was a professor at [employer], but imprisoned for illegal internet activity.  Patient states he was unfairly targeted.  Tried to work with [employer] to have his conviction overturned.  He was deported after release in  recent years.   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   Immunization History  Administered Date(s) Administered   Influenza,inj,Quad PF,6+ Mos 02/25/2014, 02/19/2015, 03/24/2021   PFIZER(Purple Top)SARS-COV-2 Vaccination 07/27/2019, 08/20/2019   Pneumococcal Conjugate-13 09/30/2020     Objective: Vital Signs: BP 125/85 (BP Location: Left Arm, Patient Position: Sitting, Cuff Size: Large)   Pulse 84   Resp 16   Ht 5' (1.524 m)   Wt 179 lb (81.2 kg)   BMI 34.96 kg/m  Physical Exam Eyes:     Conjunctiva/sclera: Conjunctivae normal.  Cardiovascular:     Rate and Rhythm: Normal rate and regular rhythm.  Pulmonary:     Effort: Pulmonary effort is normal.     Breath sounds: Normal breath sounds.  Musculoskeletal:     Right lower leg: No edema.     Left lower leg: No edema.  Skin:    General: Skin is warm and dry.  Neurological:     Mental Status: She is alert.  Psychiatric:        Mood and Affect: Mood normal.      Musculoskeletal Exam:  Shoulders full ROM no tenderness or swelling Bilateral elbow and wrist range of motion is restricted with some pain though no palpable synovitis Right worse than left bilateral wrist ROM limited, no swelling Fingers full ROM tenderness to pressure worst at thumb MCP joints, no palpable swelling Bilateral knee swelling and tenderness to pressure, right knee with swelling also on posterior side   Investigation: No additional findings.  Imaging: No results found.  Recent Labs: Lab Results  Component Value Date   WBC 8.0 11/24/2023   HGB 11.3 11/24/2023   PLT 343 11/24/2023   NA 137 11/24/2023   K 4.0 11/24/2023   CL 100 11/24/2023   CO2 20 11/24/2023   GLUCOSE 180 (H) 11/24/2023   BUN 15 11/24/2023   CREATININE 0.56 (L) 11/24/2023   BILITOT 0.2 11/24/2023   ALKPHOS 100 11/24/2023   AST 23 11/24/2023   ALT 28 11/24/2023   PROT 7.5 11/24/2023   ALBUMIN 4.1 11/24/2023   CALCIUM 8.9 11/24/2023   GFRAA 127  05/07/2020   QFTBGOLD Negative 11/11/2015   QFTBGOLDPLUS Negative 11/24/2023    Speciality Comments: Labs need to be drawn from [facility] - Indigent care fund  Procedures:  No procedures performed Allergies: Asa [aspirin] and Lisinopril    Assessment / Plan:     Visit Diagnoses: Rheumatoid arthritis involving both knees with positive rheumatoid factor (HCC) - Plan: Upadacitinib  ER (RINVOQ ) 15 MG TB24, celecoxib  (CELEBREX ) 100 MG capsule, diclofenac  Sodium (VOLTAREN ) 1 % GEL Rheumatoid arthritis with wrist joint damage.  Has some knee swelling which is persistent otherwise not bad synovitis.  Celecoxib  used for pain management due to gastrointestinal concerns with meloxicam . X-rays show no new wrist damage since 2022, indicating effective medication regimen for preventing radiographic progression. -Checking sed rate for disease activity monitoring - Continue Rinvoq  15 mg daily - Continue Celebrex  100 mg daily  High risk medication use - Rinvoq  15 mg daily, as needed low-dose Celebrex  Has been tolerating medications pretty well.  Does have some GI bloating.  Concerned about weight gain or swelling but there is no peripheral edema on exam.  No serious interval infections. - Checking CBC CMP and QuantiFERON test for medication monitoring  Chronic pain of both knees - Continue Voltaren  gel as needed. - Consider physical therapy for strengthening exercises targeting the quads and hip muscles. - Discuss potential aquatic therapy options for low-impact exercise.   Wrist joint damage Wrist joint damage with limited range of motion, particularly in the right wrist. X-rays show significant damage but no new damage since 2022. No surgical indication       Orders: No orders of the defined types were placed in this encounter.  Meds ordered this encounter  Medications   Upadacitinib  ER (RINVOQ ) 15 MG TB24    Sig: Take 1 tablet (15 mg total) by mouth daily.    Dispense:  90 tablet  Refill:  0     Prescription Type::   Renewal   celecoxib  (CELEBREX ) 100 MG capsule    Sig: Take 1 capsule (100 mg total) by mouth daily as needed.    Dispense:  90 capsule    Refill:  0   diclofenac  Sodium (VOLTAREN ) 1 % GEL    Sig: Apply 2 gram topically every 6 (six) hours as needed.    Dispense:  100 g    Refill:  1     Follow-Up Instructions: Return in about 3 months (around 02/21/2024) for RA on UPA/COX f/u 3mos.   Lonni LELON Ester, MD  Note - This record has been created using [employer].  Chart creation errors have been sought, but may not always  have been located. Such creation errors do not reflect on  the standard of medical care.

## 2023-11-21 ENCOUNTER — Ambulatory Visit: Payer: Self-pay | Attending: Internal Medicine | Admitting: Internal Medicine

## 2023-11-21 ENCOUNTER — Encounter: Payer: Self-pay | Admitting: Internal Medicine

## 2023-11-21 ENCOUNTER — Other Ambulatory Visit (HOSPITAL_COMMUNITY): Payer: Self-pay

## 2023-11-21 VITALS — BP 125/85 | HR 84 | Resp 16 | Ht 60.0 in | Wt 179.0 lb

## 2023-11-21 DIAGNOSIS — M05762 Rheumatoid arthritis with rheumatoid factor of left knee without organ or systems involvement: Secondary | ICD-10-CM

## 2023-11-21 DIAGNOSIS — Z79899 Other long term (current) drug therapy: Secondary | ICD-10-CM

## 2023-11-21 DIAGNOSIS — G8929 Other chronic pain: Secondary | ICD-10-CM

## 2023-11-21 DIAGNOSIS — M25561 Pain in right knee: Secondary | ICD-10-CM

## 2023-11-21 DIAGNOSIS — M25562 Pain in left knee: Secondary | ICD-10-CM

## 2023-11-21 DIAGNOSIS — M05761 Rheumatoid arthritis with rheumatoid factor of right knee without organ or systems involvement: Secondary | ICD-10-CM

## 2023-11-21 MED ORDER — DICLOFENAC SODIUM 1 % EX GEL
2.0000 g | Freq: Four times a day (QID) | CUTANEOUS | 1 refills | Status: AC | PRN
  Filled 2023-11-21: qty 100, 13d supply, fill #0

## 2023-11-21 MED ORDER — RINVOQ 15 MG PO TB24: 15.0000 mg | ORAL_TABLET | Freq: Every day | ORAL | 0 refills | Status: DC

## 2023-11-21 MED ORDER — CELECOXIB 100 MG PO CAPS
100.0000 mg | ORAL_CAPSULE | Freq: Every day | ORAL | 0 refills | Status: DC | PRN
  Filled 2023-11-21: qty 90, 90d supply, fill #0

## 2023-11-24 ENCOUNTER — Other Ambulatory Visit: Payer: Self-pay | Admitting: Internal Medicine

## 2023-11-24 ENCOUNTER — Other Ambulatory Visit: Payer: Self-pay

## 2023-11-24 DIAGNOSIS — Z111 Encounter for screening for respiratory tuberculosis: Secondary | ICD-10-CM

## 2023-11-24 DIAGNOSIS — Z79899 Other long term (current) drug therapy: Secondary | ICD-10-CM

## 2023-11-24 DIAGNOSIS — M05761 Rheumatoid arthritis with rheumatoid factor of right knee without organ or systems involvement: Secondary | ICD-10-CM

## 2023-11-24 NOTE — Progress Notes (Signed)
 Ordering labs under direction of rheumatology for periodic disease and drug therapy monitoring for Rheumatoid Arthritis treated by

## 2023-11-25 LAB — CBC WITH DIFFERENTIAL/PLATELET
Basophils Absolute: 0 x10E3/uL (ref 0.0–0.2)
Basos: 1 %
EOS (ABSOLUTE): 0.1 x10E3/uL (ref 0.0–0.4)
Eos: 2 %
Hematocrit: 35.4 % (ref 34.0–46.6)
Hemoglobin: 11.3 g/dL (ref 11.1–15.9)
Immature Grans (Abs): 0 x10E3/uL (ref 0.0–0.1)
Immature Granulocytes: 0 %
Lymphocytes Absolute: 2 x10E3/uL (ref 0.7–3.1)
Lymphs: 25 %
MCH: 27.8 pg (ref 26.6–33.0)
MCHC: 31.9 g/dL (ref 31.5–35.7)
MCV: 87 fL (ref 79–97)
Monocytes Absolute: 0.6 x10E3/uL (ref 0.1–0.9)
Monocytes: 7 %
Neutrophils Absolute: 5.2 x10E3/uL (ref 1.4–7.0)
Neutrophils: 65 %
Platelets: 343 x10E3/uL (ref 150–450)
RBC: 4.06 x10E6/uL (ref 3.77–5.28)
RDW: 13.7 % (ref 11.7–15.4)
WBC: 8 x10E3/uL (ref 3.4–10.8)

## 2023-11-25 LAB — COMPREHENSIVE METABOLIC PANEL WITH GFR
ALT: 28 IU/L (ref 0–32)
AST: 23 IU/L (ref 0–40)
Albumin: 4.1 g/dL (ref 3.9–4.9)
Alkaline Phosphatase: 100 IU/L (ref 44–121)
BUN/Creatinine Ratio: 27 (ref 12–28)
BUN: 15 mg/dL (ref 8–27)
Bilirubin Total: 0.2 mg/dL (ref 0.0–1.2)
CO2: 20 mmol/L (ref 20–29)
Calcium: 8.9 mg/dL (ref 8.7–10.3)
Chloride: 100 mmol/L (ref 96–106)
Creatinine, Ser: 0.56 mg/dL — ABNORMAL LOW (ref 0.57–1.00)
Globulin, Total: 3.4 g/dL (ref 1.5–4.5)
Glucose: 180 mg/dL — ABNORMAL HIGH (ref 70–99)
Potassium: 4 mmol/L (ref 3.5–5.2)
Sodium: 137 mmol/L (ref 134–144)
Total Protein: 7.5 g/dL (ref 6.0–8.5)
eGFR: 104 mL/min/1.73 (ref >59)

## 2023-11-25 LAB — SEDIMENTATION RATE: Sed Rate: 67 mm/h — ABNORMAL HIGH (ref 0–40)

## 2023-11-29 LAB — QUANTIFERON-TB GOLD PLUS
QuantiFERON Mitogen Value: >10 [IU]/mL
QuantiFERON Nil Value: 0.06 [IU]/mL
QuantiFERON TB1 Ag Value: 0.05 [IU]/mL
QuantiFERON TB2 Ag Value: 0.05 [IU]/mL
QuantiFERON-TB Gold Plus: NEGATIVE

## 2023-11-30 ENCOUNTER — Other Ambulatory Visit (HOSPITAL_COMMUNITY): Payer: Self-pay

## 2023-11-30 ENCOUNTER — Telehealth: Payer: Self-pay | Admitting: Internal Medicine

## 2023-11-30 NOTE — Telephone Encounter (Signed)
 Contacted the patient advised Dr. Jeannetta sent in a 3 month supply on 11/21/2023. Patient verbalized understanding.

## 2023-11-30 NOTE — Telephone Encounter (Signed)
 Patient contacted the office to request a medication refill.   1. Name of Medication: Rinvoq   2. How are you currently taking this medication (dosage and times per day)? Pt states its a 3 month prescription    3. What pharmacy would you like for that to be sent to? [facility]

## 2023-12-15 ENCOUNTER — Other Ambulatory Visit: Payer: Self-pay | Admitting: Student

## 2023-12-15 DIAGNOSIS — I10 Essential (primary) hypertension: Secondary | ICD-10-CM

## 2023-12-16 ENCOUNTER — Other Ambulatory Visit (HOSPITAL_COMMUNITY): Payer: Self-pay

## 2023-12-16 ENCOUNTER — Other Ambulatory Visit: Payer: Self-pay

## 2023-12-16 NOTE — Telephone Encounter (Signed)
 Patient was last seen 08/30/22 I called the patient to schedule a follow up appointment. Patient stated that she doesn't want to make a appointment right now because her orange card expired. I gave the patient Eric number, patient stated that she would give him a call. Patient is requesting her medication to be refilled.

## 2023-12-17 ENCOUNTER — Other Ambulatory Visit (HOSPITAL_COMMUNITY): Payer: Self-pay

## 2023-12-17 MED ORDER — LOSARTAN POTASSIUM 50 MG PO TABS
75.0000 mg | ORAL_TABLET | Freq: Every day | ORAL | 0 refills | Status: DC
  Filled 2023-12-17: qty 45, 30d supply, fill #0
  Filled 2024-01-26: qty 45, 30d supply, fill #1
  Filled 2024-02-26: qty 45, 30d supply, fill #2

## 2023-12-20 ENCOUNTER — Other Ambulatory Visit (HOSPITAL_COMMUNITY): Payer: Self-pay

## 2024-01-27 ENCOUNTER — Other Ambulatory Visit (HOSPITAL_COMMUNITY): Payer: Self-pay

## 2024-02-21 NOTE — Progress Notes (Deleted)
 Office Visit Note  Patient: Vicki Cook             Date of Birth: 11-30-1960           MRN: [id number]             PCP: Elnora Ip, MD Referring: Elnora Ip,* Visit Date: 03/05/2024   Subjective:  No chief complaint on file.   History of Present Illness: Vicki Cook is a 63 y.o. female here for follow up for seropositive RA on rinvoq  15 mg daily and Celebrex  100 mg daily.  Previous HPI 11/21/2023 Vicki Cook is a 63 y.o. female here for follow up for seropositive RA on rinvoq  15 mg daily and Celebrex  100 mg daily.     She continues to take Rinvoq  and has recently switched from meloxicam  to celecoxib  to mitigate gastrointestinal side effects. Celecoxib  is used on 'bad days' or during increased physical activity or cold weather, and she finds it as effective as meloxicam .   She is concerned about potential weight gain with celecoxib , noting some bloating. She has stopped working out due to pain when walking and is currently doing yoga once a week, which she feels is insufficient. She is interested in aquatic exercises but has not yet pursued them.   She experiences pain primarily when engaging in activities such as climbing stairs or getting up from a seated position. Her hands hurt during certain activities.   She is interested in strengthening exercises for her legs, particularly targeting the quadriceps, and is considering using resistance bands for exercises that do not involve the knee. She experiences occasional pain in her legs.      Previous HPI 08/29/2023 Vicki Cook is a 63 y.o. 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   She experiences swelling in her knees, particularly the right knee, which impairs her ability to perform daily activities such as standing for long periods and rising from a seated position. She has difficulty walking, especially in the mornings when it is cold, and struggles to get up  from low cars or SUVs.   She has been using Rinvoq  for her condition but discontinued meloxicam  due to stomach irritation. She applies Voltaren  gel as needed, which she finds helpful but does not use daily. She recently took ibuprofen  due to severe pain, although she typically avoids it because it can elevate her blood pressure and cause stomach upset.   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 Her work and home responsibilities are impacted by her knee pain and swelling.    Previous HPI 01/10/2023 Vicki Cook is a 63 y.o. female here for follow up for seropositive RA on rinvoq  15 mg daily also now on meloxicam  15 mg daily PRN.  She still experiences significant joint pain and stiffness in multiple areas unless she also takes the meloxicam .  When taking both Rinvoq  and meloxicam  she feels symptoms are pretty well-controlled.  She still has limited mobility in the left elbow and she has swelling at the base of the right thumb that comes and goes.  Knees are painful frequently at the back of the knee.  In the past month has a new facial rash this is itchy.  No involvement anywhere else.  She tried application of topical triamcinolone  0.1% that did not help.   Previous HPI 10/11/2022 Vicki Cook is a 63  y.o. female here for follow up for seropositive RA on rinvoq  15 mg daily also now on meloxicam  15 mg daily.  Symptoms doing much better compared to our visit in April.  Took prednisone  as directed immediately after last visit with partial benefit but symptoms subsequently worsened again.  For the first week or 2 after starting Rinvoq  felt symptoms getting worse.  She sought Dr. Franki in sports medicine clinic and started on meloxicam  with significant improvement in joint pain and swelling.  Now on both medications for about 3 weeks and feels her symptoms are doing a lot better.  Decreased pain and stiffness and swelling in  multiple areas although not completely gone.  So far did not notice any intolerance or trouble taking the medications.   Previous HPI 08/11/22 Vicki Cook is a 63 y.o. female here for follow up for seropositive RA after switching to Humira  after last visit due to decreased efficacy on Enbrel .  So far she has taken 3 doses of medication.  No appreciable benefit in symptoms so far.  She is also taking ibuprofen  as needed which is partially helpful.  Has symptoms involving hands wrists elbows but also significant knee swelling and pain with pressure or with any walking or prolonged standing.  Has not experienced significant injection reactions or any new major illness since starting Humira .  Her son accompanies her today.   Previous HPI 04/07/22 Vicki Cook is a 63 y.o. female here for follow up for seropositive RA on Enbrel  50 mg [place] weekly. Bilateral knee steroid injection at last visit due to inflammation and pain and this has been beneficial during the past few months.  She feels like the Enbrel  is definitely helping when she had to interrupt treatment temporarily saw a large worsening of symptoms.  Since resuming this though does not seem to be as effective as before.  She notices waning efficacy she takes doses on Saturday but notices some increase in joint pain and stiffness starting by around Friday of each week.  She is also frustrated today due to some incorrect billing with account numbers from [employer] resulting in a large bill that was not processed through the orange card system properly.   Previous HPI 01/06/22 Vicki Cook is a 63 y.o. female here for follow up for seropositive RA on Enbrel  50 mg [place] weekly. She was unable to get her regular labs drawn awaiting renewal of orange card for this year. She did restart the Enbrel  since about 2 weeks ago but was off this for 2 months and had a lot of increased pain and swelling. Including finger swelling which she did not have before.    Previous HPI 06/15/2021 Vicki Cook is a 63 y.o. female here for follow up for seropositive RA on Enbrel  50 mg [place] weekly. She feels a good benefit although left knee pain and swelling is persistent. A small amount of elbow and wrist pain but no effusions like in her knee. She hs not suffered any serious infections or other complications.    Previous HPI 03/23/21 Vicki Cook SALBERG is a 63 y.o. female here for follow up for seropositive RA on Enbrel  50 mg [place] weekly. She feels that symptoms are partially improved on the Enbrel . Hands and wrists are significantly improved. She continues having bilateral knee swelling especially on right side. She takes occasional tylenol for symptoms. She has gained at least 5 pounds over the past few months despite trying to stay active.   Previous  HPI: 09/30/20 Jacayla Nordell [place] is a 63 y.o. female here for evaluation with bilateral shoulder pain and history of rheumatoid arthritis and concern for possible PMR. She was previously seen with sports medicine with shoulder ultrasound consistent with inflammatory changes and treated with intraarticular injection. She was previously evaluated for rheumatoid arthritis and recommended to start methotrexate apparently taking the medicine less than a month. Recent labs with significant inflammatory markers and having some jaw pain so concern of GCA and started prednisone  taper since January but never saw vascular surgery for biopsy due to cost issues.   No Rheumatology ROS completed.   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   Rheumatoid arthritis (HCC) 05/04/2015   Microcytic anemia 05/02/2014   Healthcare maintenance 02/25/2014   HTN (hypertension) 02/07/2014    Past Medical History:  Diagnosis Date   Bilateral knee pain 03/20/2014    Cough 07/22/2014   Eczema 12/11/2018   H/O pinworm infection    Hyperkeratosis of sole 11/26/2019   Hypertension 2012   Panic attack 07/19/2014   Rheumatoid arthritis (HCC) 2017   Right-sided back pain 04/06/2021   Shoulder weakness 05/08/2020   Temporal pain 05/08/2020    Family History  Problem Relation Age of Onset   Heart disease Mother    Hyperlipidemia Mother    Hypertension Mother    Heart disease Father    Hypertension Father    Diabetes Father    Healthy Brother    Healthy Brother    Healthy Son    Healthy Son    No past surgical history on file. Social History   Social History Narrative   Originally from    [place]   Has lived in [place]. Since 1999   2 young adult sons   Husband was a professor at [employer], but imprisoned for illegal internet activity.  Patient states he was unfairly targeted.  Tried to work with [employer] to have his conviction overturned.  He was deported after release in recent years.   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   Immunization History  Administered Date(s) Administered   Influenza,inj,Quad PF,6+ Mos 02/25/2014, 02/19/2015, 03/24/2021   PFIZER(Purple Top)SARS-COV-2 Vaccination 07/27/2019, 08/20/2019   Pneumococcal Conjugate-13 09/30/2020     Objective: Vital Signs: There were no vitals taken for this visit.   Physical Exam   Musculoskeletal Exam: ***  CDAI Exam: CDAI Score: -- Patient Global: --; Provider Global: -- Swollen: --; Tender: -- Joint Exam 03/05/2024   No joint exam has been documented for this visit   There is currently no information documented on the homunculus. Go to the Rheumatology activity and complete the homunculus joint exam.  Investigation: No additional findings.  Imaging: No results found.  Recent Labs: Lab Results  Component Value Date   WBC 8.0 11/24/2023   HGB 11.3 11/24/2023   PLT 343 11/24/2023   NA 137 11/24/2023   K 4.0 11/24/2023   CL 100 11/24/2023   CO2 20  11/24/2023   GLUCOSE 180 (H) 11/24/2023   BUN 15 11/24/2023   CREATININE 0.56 (L) 11/24/2023   BILITOT 0.2 11/24/2023   ALKPHOS 100 11/24/2023   AST 23 11/24/2023   ALT 28 11/24/2023   PROT 7.5 11/24/2023   ALBUMIN 4.1 11/24/2023   CALCIUM 8.9 11/24/2023  GFRAA 127 05/07/2020   QFTBGOLD Negative 11/11/2015   QFTBGOLDPLUS Negative 11/24/2023    Speciality Comments: Labs need to be drawn from [facility] Indigent care fund  Procedures:  No procedures performed Allergies: Asa [aspirin] and Lisinopril    Assessment / Plan:     Visit Diagnoses: No diagnosis found.  ***  Orders: No orders of the defined types were placed in this encounter.  No orders of the defined types were placed in this encounter.    Follow-Up Instructions: No follow-ups on file.   Lada Fulbright M Rudolph Dobler, CMA  Note - This record has been created using Animal nutritionist.  Chart creation errors have been sought, but may not always  have been located. Such creation errors do not reflect on  the standard of medical care.

## 2024-02-27 ENCOUNTER — Other Ambulatory Visit (HOSPITAL_COMMUNITY): Payer: Self-pay

## 2024-03-01 ENCOUNTER — Other Ambulatory Visit (HOSPITAL_COMMUNITY): Payer: Self-pay

## 2024-03-05 ENCOUNTER — Ambulatory Visit: Payer: Self-pay | Admitting: Internal Medicine

## 2024-03-05 DIAGNOSIS — G8929 Other chronic pain: Secondary | ICD-10-CM

## 2024-03-05 DIAGNOSIS — M05761 Rheumatoid arthritis with rheumatoid factor of right knee without organ or systems involvement: Secondary | ICD-10-CM

## 2024-03-05 DIAGNOSIS — Z79899 Other long term (current) drug therapy: Secondary | ICD-10-CM

## 2024-03-26 ENCOUNTER — Telehealth: Payer: Self-pay | Admitting: *Deleted

## 2024-03-26 NOTE — Telephone Encounter (Signed)
 Copied from CRM #[id number]. Topic: General - Other >> Mar 26, 2024 12:49 PM Shamecia H wrote: Reason for CRM: Patient is calling and wants to speak to Ms. Doris Patients callback number is [phone]. Could you assist? If she does not answer try calling back due to travel.

## 2024-03-26 NOTE — Telephone Encounter (Signed)
 Called pt who stated she wants to talk to [place]. I asked if there's a particular reason; she stated she wants to talk to [facility].

## 2024-04-04 ENCOUNTER — Ambulatory Visit: Payer: Self-pay | Admitting: Student

## 2024-04-04 ENCOUNTER — Other Ambulatory Visit (HOSPITAL_COMMUNITY): Payer: Self-pay

## 2024-04-04 VITALS — BP 160/97 | HR 70 | Temp 97.4°F | Ht 60.0 in | Wt 166.4 lb

## 2024-04-04 DIAGNOSIS — I10 Essential (primary) hypertension: Secondary | ICD-10-CM

## 2024-04-04 DIAGNOSIS — Z79899 Other long term (current) drug therapy: Secondary | ICD-10-CM

## 2024-04-04 DIAGNOSIS — E119 Type 2 diabetes mellitus without complications: Secondary | ICD-10-CM

## 2024-04-04 DIAGNOSIS — L84 Corns and callosities: Secondary | ICD-10-CM

## 2024-04-04 MED ORDER — LOSARTAN POTASSIUM 50 MG PO TABS
50.0000 mg | ORAL_TABLET | Freq: Every day | ORAL | 3 refills | Status: AC
  Filled 2024-04-04: qty 90, 90d supply, fill #0

## 2024-04-04 MED ORDER — AMLODIPINE BESYLATE 5 MG PO TABS
5.0000 mg | ORAL_TABLET | Freq: Every day | ORAL | 3 refills | Status: AC
  Filled 2024-04-04: qty 90, 90d supply, fill #0

## 2024-04-04 NOTE — Progress Notes (Signed)
 Patient name: Vicki Cook Date of birth: 1961-01-28 Date of visit: 04/04/24  Subjective  Reason for visit: Follow-up (Patient here for follow up on blood pressure) and Medication Refill (PT needs medication refill.)  Discussed the use of AI scribe software for clinical note transcription with the patient, who gave verbal consent to proceed.  History of Present Illness   Vicki Cook is a 63 year old female with hypertension who presents for a blood pressure check and medication refill.  Hypertension - Presents for blood pressure check and medication refill - Not regularly checking blood pressure at home despite having a gauge - Out of amlodipine  but continues to take losartan  daily - Blood pressure readings are usually low when checked at her rheumatologist's office  Plantar callus discomfort - Callus on foot causing discomfort due to rubbing against shoes - Uses scrapers or filers on the callus, especially after showers - Tried salicylic acid for two days - Wears different shoes for work and casual outings - Previously recommended urea 40% cream and salicylic acid for calluses  Physical activity and cardiopulmonary symptoms - Physically active, working out three to four days a week, including swimming - No issues with breathing - No chest pain  Blood glucose concerns - Concerned about blood sugar levels - Does not consume sweets and has stopped adding sugar to tea - Attributes previous high blood sugar reading to prednisone  use, which she is no longer taking       Outpatient Medications Prior to Visit  Medication Sig   CALCIUM PO Take by mouth.   Calcium-Magnesium-Vitamin D (CALCIUM MAGNESIUM PO) Take by mouth. (Patient not taking: Reported on 11/21/2023)   celecoxib  (CELEBREX ) 100 MG capsule Take 1 capsule (100 mg total) by mouth daily as needed.   diclofenac  Sodium (VOLTAREN ) 1 % GEL Apply 2 gram topically every 6 (six) hours as needed.   triamcinolone  ointment  (KENALOG ) 0.1 % Apply to affected areas twice daily as needed   Upadacitinib  ER (RINVOQ ) 15 MG TB24 Take 1 tablet (15 mg total) by mouth daily.   VITAMIN D PO Take by mouth. (Patient not taking: Reported on 11/21/2023)   [DISCONTINUED] amLODipine  (NORVASC ) 5 MG tablet Take 1 tablet (5 mg total) by mouth daily.   [DISCONTINUED] losartan  (COZAAR ) 50 MG tablet Take 1&1/2 tablets (75 mg total) by mouth daily.   No facility-administered medications prior to visit.     Objective  Today's Vitals   04/04/24 1507  BP: (!) 160/97  Pulse: 70  Temp: (!) 97.4 F (36.3 C)  TempSrc: Oral  SpO2: 99%  Weight: 166 lb 6.4 oz (75.5 kg)  Height: 5' (1.524 m)  PainSc: 7   Body mass index is 32.5 kg/m.   Physical Exam Constitutional:      Appearance: Normal appearance.  Cardiovascular:     Rate and Rhythm: Normal rate and regular rhythm.  Pulmonary:     Effort: Pulmonary effort is normal. No respiratory distress.  Skin:    General: Skin is warm and dry.  Neurological:     Mental Status: She is alert.     Cranial Nerves: No facial asymmetry.  Psychiatric:        Mood and Affect: Affect normal.        Speech: Speech normal.        Behavior: Behavior normal.    Diabetic Foot Exam - Simple   Simple Foot Form Diabetic Foot exam was performed with the following findings: Yes 04/04/2024  4:00 PM  Visual Inspection See comments: Yes Sensation Testing Intact to touch and monofilament testing bilaterally: Yes Pulse Check Posterior Tibialis and Dorsalis pulse intact bilaterally: Yes Comments Feet healthy. Callus on right fifth metatarsal head. Callus on left fourth toe. Nail fungus present.      Assessment & Plan Diabetes mellitus type II, controlled (HCC) Lab Results  Component Value Date   HGBA1C 6.5 (H) 04/08/2022   HGBA1C 6.2 (H) 02/07/2014   Chronic, historically good control. She is not on medications. She declines A1c testing today. Hopefully she'll return in three months with  financial assistance, at that visit check A1c.     Primary hypertension BP Readings from Last 3 Encounters:  04/04/24 (!) 160/97  11/21/23 125/85  08/29/23 121/83   Chronic, historically decent control. She slept poorly last night and feels stressed. For now, continue losartan  50 mg daily (as she's been taking it, rather than the 75 mg prescribed) and amlodipine  5 mg daily. Refills sent. BMP from prior rheumatology visit looks okay.  Orders:   losartan  (COZAAR ) 50 MG tablet; Take 1 tablet (50 mg total) by mouth daily.   amLODipine  (NORVASC ) 5 MG tablet; Take 1 tablet (5 mg total) by mouth daily.  Corn of foot Chronic, somewhat painful. On right 5th metatarsal head and left 4th toe. Referral to podiatry placed, she's seen Dr. Silva previously. Orders:   Ambulatory referral to Podiatry  Return in about 3 months (around 07/03/2024).  Ozell Kung MD 04/04/2024, 3:59 PM

## 2024-04-04 NOTE — Assessment & Plan Note (Addendum)
 Lab Results  Component Value Date   HGBA1C 6.5 (H) 04/08/2022   HGBA1C 6.2 (H) 02/07/2014   Chronic, historically good control. She is not on medications. She declines A1c testing today. Hopefully she'll return in three months with financial assistance, at that visit check A1c.

## 2024-04-04 NOTE — Assessment & Plan Note (Deleted)
 Lab Results  Component Value Date   ESRSEDRATE 67 (H) 11/24/2023   ESRSEDRATE 77 (H) 09/14/2023   ESRSEDRATE 82 (H) 06/16/2023

## 2024-04-04 NOTE — Assessment & Plan Note (Addendum)
 BP Readings from Last 3 Encounters:  04/04/24 (!) 160/97  11/21/23 125/85  08/29/23 121/83   Chronic, historically decent control. She slept poorly last night and feels stressed. For now, continue losartan  50 mg daily (as she's been taking it, rather than the 75 mg prescribed) and amlodipine  5 mg daily. Refills sent. BMP from prior rheumatology visit looks okay.  Orders:   losartan  (COZAAR ) 50 MG tablet; Take 1 tablet (50 mg total) by mouth daily.   amLODipine  (NORVASC ) 5 MG tablet; Take 1 tablet (5 mg total) by mouth daily.

## 2024-04-04 NOTE — Patient Instructions (Signed)
 VISIT SUMMARY: Today, we addressed your blood pressure, blood sugar levels, and foot calluses. We also discussed your mild toenail fungus and provided recommendations for each issue.  YOUR PLAN: ESSENTIAL HYPERTENSION: Your blood pressure is slightly elevated, and you have not been regularly checking it at home. -Refilled your amlodipine  prescription. -Please monitor your blood pressure at home weekly and keep a log. -We will follow up in three months.  TYPE 2 DIABETES MELLITUS WITHOUT COMPLICATIONS: Your blood sugar levels are slightly elevated, and you are aware of the importance of monitoring to prevent complications. -We will check your hemoglobin A1c in three months. -Continue to avoid sweets and monitor your blood sugar regularly.  CALLUSES OF FEET: You have calluses on your feet causing discomfort. -Use urea 40% cream with salicylic acid as recommended. -Apply lotion nightly and file the calluses daily. -We referred you to a podiatrist for further management.  ONYCHOMYCOSIS (MILD NAIL FUNGUS): You have a mild toenail fungus with no significant complications. -Monitor for any signs of infection or worsening. -Consider a follow-up with a podiatrist if you wish.  Remember to bring all of the medications that you take (including over the counter medications and supplements) with you to every clinic visit.  This after visit summary is an important review of tests, referrals, and medication changes that were discussed during your visit. If you have questions or concerns, call [phone]. Outside of clinic business hours, call the main hospital at [phone] and ask the operator for the on-call internal medicine resident.   Ozell Kung MD 04/04/2024, 3:55 PM

## 2024-04-30 ENCOUNTER — Telehealth: Payer: Self-pay

## 2024-04-30 DIAGNOSIS — M05761 Rheumatoid arthritis with rheumatoid factor of right knee without organ or systems involvement: Secondary | ICD-10-CM

## 2024-04-30 NOTE — Telephone Encounter (Signed)
 Contacted patient about a fax refill rx for Rinvoq , patient needs labs and get them done at [facility] for insurance purposes. Patient will update at up coming appointment. She advised she is good on meds for now till she gets to her appointment.

## 2024-05-04 ENCOUNTER — Other Ambulatory Visit: Payer: Self-pay

## 2024-05-04 DIAGNOSIS — M05761 Rheumatoid arthritis with rheumatoid factor of right knee without organ or systems involvement: Secondary | ICD-10-CM

## 2024-05-04 NOTE — Addendum Note (Signed)
 Addended by: CHERISSE IP on: 05/04/2024 12:32 PM   Modules accepted: Orders

## 2024-05-04 NOTE — Telephone Encounter (Signed)
 Open in error

## 2024-05-04 NOTE — Telephone Encounter (Addendum)
 Refill request received via fax from [facility] for Rinvoq   Last Fill: 11/21/2023  Labs: 11/24/2023 CMP glucose:180, creatinine,ser:0.56, CBC WNL  TB Gold: 11/24/2023 NEG.   Next Visit: 05/23/2024  Last Visit: 11/21/2023  IK:Myzlfjunpi arthritis involving both knees with positive rheumatoid factor   Current Dose per office note 11/21/2023: Rinvoq  15 mg daily   Called patient advised her to update labs stated she will update labs at next visit 05/23/2024  Okay to refill Rinvoq ?

## 2024-05-07 MED ORDER — RINVOQ 15 MG PO TB24: 15.0000 mg | ORAL_TABLET | Freq: Every day | ORAL | 0 refills | Status: AC

## 2024-05-09 NOTE — Progress Notes (Signed)
 "  Office Visit Note  Patient: Vicki Cook             Date of Birth: 01/01/1961           MRN: [id number]             PCP: Elnora Ip, MD Referring: Elnora Ip, MD Visit Date: 05/23/2024   Subjective:   Discussed the use of AI scribe software for clinical note transcription with the patient, who gave verbal consent to proceed.  History of Present Illness   Vicki Cook is a 64 y.o. female here for follow up for seropositive RA on rinvoq  15 mg daily and Celebrex  100 mg daily.   There is improvement in joint swelling and pain, particularly in her hands, where she can now wear rings again. However, she experiences difficulty straightening her fingers, which has been a persistent issue. No current pain in these joints.  She engages in water exercises, which she finds beneficial for her symptoms.  Her family situation has improved as both her sons are now employed, though she mentions stress related to their relationship prospects.      Previous HPI 11/21/2023 Vicki Cook is a 64 y.o. female here for follow up for seropositive RA on rinvoq  15 mg daily and Celebrex  100 mg daily.     She continues to take Rinvoq  and has recently switched from meloxicam  to celecoxib  to mitigate gastrointestinal side effects. Celecoxib  is used on 'bad days' or during increased physical activity or cold weather, and she finds it as effective as meloxicam .   She is concerned about potential weight gain with celecoxib , noting some bloating. She has stopped working out due to pain when walking and is currently doing yoga once a week, which she feels is insufficient. She is interested in aquatic exercises but has not yet pursued them.   She experiences pain primarily when engaging in activities such as climbing stairs or getting up from a seated position. Her hands hurt during certain activities.   She is interested in strengthening exercises for her legs, particularly  targeting the quadriceps, and is considering using resistance bands for exercises that do not involve the knee. She experiences occasional pain in her legs.      Previous HPI 08/29/2023 Vicki Cook is a 64 y.o. 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   She experiences swelling in her knees, particularly the right knee, which impairs her ability to perform daily activities such as standing for long periods and rising from a seated position. She has difficulty walking, especially in the mornings when it is cold, and struggles to get up from low cars or SUVs.   She has been using Rinvoq  for her condition but discontinued meloxicam  due to stomach irritation. She applies Voltaren  gel as needed, which she finds helpful but does not use daily. She recently took ibuprofen  due to severe pain, although she typically avoids it because it can elevate her blood pressure and cause stomach upset.   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 Her work and home responsibilities are impacted by her knee pain and swelling.    Previous HPI 01/10/2023 Vicki Cook is a 64 y.o. female here for follow up for seropositive RA on rinvoq  15 mg daily also now on meloxicam  15 mg daily PRN.  She still experiences significant joint pain and stiffness in  multiple areas unless she also takes the meloxicam .  When taking both Rinvoq  and meloxicam  she feels symptoms are pretty well-controlled.  She still has limited mobility in the left elbow and she has swelling at the base of the right thumb that comes and goes.  Knees are painful frequently at the back of the knee.  In the past month has a new facial rash this is itchy.  No involvement anywhere else.  She tried application of topical triamcinolone  0.1% that did not help.   Previous HPI 10/11/2022 Vicki Cook is a 64 y.o. female here for follow up for seropositive RA  on rinvoq  15 mg daily also now on meloxicam  15 mg daily.  Symptoms doing much better compared to our visit in April.  Took prednisone  as directed immediately after last visit with partial benefit but symptoms subsequently worsened again.  For the first week or 2 after starting Rinvoq  felt symptoms getting worse.  She sought Dr. Franki in sports medicine clinic and started on meloxicam  with significant improvement in joint pain and swelling.  Now on both medications for about 3 weeks and feels her symptoms are doing a lot better.  Decreased pain and stiffness and swelling in multiple areas although not completely gone.  So far did not notice any intolerance or trouble taking the medications.   Previous HPI 08/11/22 Vicki Cook is a 64 y.o. female here for follow up for seropositive RA after switching to Humira  after last visit due to decreased efficacy on Enbrel .  So far she has taken 3 doses of medication.  No appreciable benefit in symptoms so far.  She is also taking ibuprofen  as needed which is partially helpful.  Has symptoms involving hands wrists elbows but also significant knee swelling and pain with pressure or with any walking or prolonged standing.  Has not experienced significant injection reactions or any new major illness since starting Humira .  Her son accompanies her today.   Previous HPI 04/07/22 Vicki Cook is a 64 y.o. female here for follow up for seropositive RA on Enbrel  50 mg [place] weekly. Bilateral knee steroid injection at last visit due to inflammation and pain and this has been beneficial during the past few months.  She feels like the Enbrel  is definitely helping when she had to interrupt treatment temporarily saw a large worsening of symptoms.  Since resuming this though does not seem to be as effective as before.  She notices waning efficacy she takes doses on Saturday but notices some increase in joint pain and stiffness starting by around Friday of each week.  She is also  frustrated today due to some incorrect billing with account numbers from [employer] resulting in a large bill that was not processed through the orange card system properly.   Previous HPI 01/06/22 Adelee CAYTLIN BETTER is a 64 y.o. female here for follow up for seropositive RA on Enbrel  50 mg [place] weekly. She was unable to get her regular labs drawn awaiting renewal of orange card for this year. She did restart the Enbrel  since about 2 weeks ago but was off this for 2 months and had a lot of increased pain and swelling. Including finger swelling which she did not have before.   Previous HPI 06/15/2021 Buffi KRISTINE CHAHAL is a 64 y.o. female here for follow up for seropositive RA on Enbrel  50 mg [place] weekly. She feels a good benefit although left knee pain and swelling is persistent. A small amount of elbow and  wrist pain but no effusions like in her knee. She hs not suffered any serious infections or other complications.    Previous HPI 03/23/21 Malani EVANI SHRIDER is a 64 y.o. female here for follow up for seropositive RA on Enbrel  50 mg [place] weekly. She feels that symptoms are partially improved on the Enbrel . Hands and wrists are significantly improved. She continues having bilateral knee swelling especially on right side. She takes occasional tylenol for symptoms. She has gained at least 5 pounds over the past few months despite trying to stay active.   Previous HPI: 09/30/20 ANNMARGARET DECAPRIO is a 64 y.o. female here for evaluation with bilateral shoulder pain and history of rheumatoid arthritis and concern for possible PMR. She was previously seen with sports medicine with shoulder ultrasound consistent with inflammatory changes and treated with intraarticular injection. She was previously evaluated for rheumatoid arthritis and recommended to start methotrexate apparently taking the medicine less than a month. Recent labs with significant inflammatory markers and having some jaw pain so concern of GCA and started  prednisone  taper since January but never saw vascular surgery for biopsy due to cost issues.   Review of Systems  Constitutional:  Positive for fatigue.  HENT:  Positive for mouth dryness. Negative for mouth sores.   Eyes:  Negative for dryness.  Respiratory:  Negative for shortness of breath.   Cardiovascular:  Negative for chest pain and palpitations.  Gastrointestinal:  Negative for blood in stool, constipation and diarrhea.  Endocrine: Negative for increased urination.  Genitourinary:  Negative for involuntary urination.  Musculoskeletal:  Positive for joint pain, joint pain, joint swelling, myalgias and myalgias. Negative for gait problem, muscle weakness, morning stiffness and muscle tenderness.  Skin:  Positive for hair loss. Negative for color change, rash and sensitivity to sunlight.  Allergic/Immunologic: Negative for susceptible to infections.  Neurological:  Negative for dizziness and headaches.  Hematological:  Negative for swollen glands.  Psychiatric/Behavioral:  Negative for depressed mood and sleep disturbance. The patient is not nervous/anxious.     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   Rheumatoid arthritis (HCC) 05/04/2015   Microcytic anemia 05/02/2014   HTN (hypertension) 02/07/2014    Past Medical History:  Diagnosis Date   Bilateral knee pain 03/20/2014   Cough 07/22/2014   Eczema 12/11/2018   H/O pinworm infection    Hyperkeratosis of sole 11/26/2019   Hypertension 2012   Panic attack 07/19/2014   Rheumatoid arthritis (HCC) 2017   Right-sided back pain 04/06/2021   Shoulder weakness 05/08/2020   Temporal pain 05/08/2020    Family History  Problem Relation Age of Onset   Heart disease Mother    Hyperlipidemia Mother    Hypertension Mother    Heart disease Father     Hypertension Father    Diabetes Father    Healthy Brother    Healthy Brother    Healthy Son    Healthy Son    History reviewed. No pertinent surgical history. Social History   Social History Narrative   Originally from    [place]   Has lived in [place]. Since 1999   2 young adult sons   Husband was a professor at [employer], but imprisoned for illegal internet activity.  Patient states he was unfairly targeted.  Tried to work with [employer]  to have his conviction overturned.  He was deported after release in recent years.   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   Immunization History  Administered Date(s) Administered   Influenza,inj,Quad PF,6+ Mos 02/25/2014, 02/19/2015, 03/24/2021   PFIZER(Purple Top)SARS-COV-2 Vaccination 07/27/2019, 08/20/2019   Pneumococcal Conjugate-13 09/30/2020     Objective: Vital Signs: BP (!) 149/87   Pulse 67   Temp (!) 96.7 F (35.9 C)   Resp 16   Ht 5' (1.524 m)   Wt 164 lb 4 oz (74.5 kg)   BMI 32.08 kg/m    Physical Exam Eyes:     Conjunctiva/sclera: Conjunctivae normal.  Cardiovascular:     Rate and Rhythm: Normal rate and regular rhythm.  Pulmonary:     Effort: Pulmonary effort is normal.     Breath sounds: Normal breath sounds.  Lymphadenopathy:     Cervical: No cervical adenopathy.  Skin:    General: Skin is warm and dry.  Neurological:     Mental Status: She is alert.  Psychiatric:        Mood and Affect: Mood normal.      Musculoskeletal Exam:  Shoulders full ROM no tenderness or swelling Bilateral elbow and wrist range of motion is restricted by about 15 degrees, no tenderness or effusion Right worse than left bilateral wrist ROM limited, no swelling Fingers full ROM tenderness to pressure worst at thumb MCP joints, no palpable swelling Bilateral knee swelling and tenderness to pressure, right knee with swelling also on posterior side  Investigation: No additional findings.  Imaging: No results  found.  Recent Labs: Lab Results  Component Value Date   WBC 8.0 11/24/2023   HGB 11.3 11/24/2023   PLT 343 11/24/2023   NA 137 11/24/2023   K 4.0 11/24/2023   CL 100 11/24/2023   CO2 20 11/24/2023   GLUCOSE 180 (H) 11/24/2023   BUN 15 11/24/2023   CREATININE 0.56 (L) 11/24/2023   BILITOT 0.2 11/24/2023   ALKPHOS 100 11/24/2023   AST 23 11/24/2023   ALT 28 11/24/2023   PROT 7.5 11/24/2023   ALBUMIN 4.1 11/24/2023   CALCIUM 8.9 11/24/2023   GFRAA 127 05/07/2020   QFTBGOLD Negative 11/11/2015   QFTBGOLDPLUS Negative 11/24/2023    Speciality Comments: Labs need to be drawn from [facility] - Indigent care fund  Procedures:  No procedures performed Allergies: Asa [aspirin] and Lisinopril    Assessment / Plan:     Visit Diagnoses: Rheumatoid arthritis involving both knees with positive rheumatoid factor (HCC) - Plan: celecoxib  (CELEBREX ) 100 MG capsule Rheumatoid arthritis with positive rheumatoid factor, primarily affecting the knees and wrists. Joint swelling and pain are well-controlled with Rinvoq , but permanent joint damage limits range of motion. - Continue Rinvoq  15 mg daily for rheumatoid arthritis management. - Continue celebrex  100 mg daily - Ordered blood test to assess inflammation levels. - Provided information on osteoarthritis and supplement treatments.  High risk medication use No serious interval infections. - Checking CBC and CMP and lipid panel for medication monitoring on rinvoq  and celebrex   Osteoarthritis of knees and wrist Osteoarthritis in knees and wrist causes range of motion limitations. Chronic changes lead to restricted movement. - Continue underwater cycling for low-impact exercise. - Provided information on osteoarthritis and supplement treatments.  Chronic pain of both knees Chronic knee pain secondary to osteoarthritis and rheumatoid arthritis. Managed with Rinvoq ; patient reports some exacerbation of symptoms when not on meloxicam .       Orders: No orders of  the defined types were placed in this encounter.  Meds ordered this encounter  Medications   celecoxib  (CELEBREX ) 100 MG capsule    Sig: Take 1 capsule (100 mg total) by mouth daily as needed.    Dispense:  90 capsule    Refill:  0     Follow-Up Instructions: Return in about 3 months (around 08/21/2024) for RA on UPA/COX f/u 3mos.   Lonni LELON Ester, MD  Note - This record has been created using [employer].  Chart creation errors have been sought, but may not always  have been located. Such creation errors do not reflect on  the standard of medical care. "

## 2024-05-10 NOTE — Progress Notes (Signed)
 Internal Medicine Clinic Attending  Case discussed with the resident at the time of the visit.  We reviewed the resident's history and exam and pertinent patient test results.  I agree with the assessment, diagnosis, and plan of care documented in the resident's note.

## 2024-05-23 ENCOUNTER — Other Ambulatory Visit (HOSPITAL_COMMUNITY): Payer: Self-pay

## 2024-05-23 ENCOUNTER — Ambulatory Visit: Payer: Self-pay | Attending: Internal Medicine | Admitting: Internal Medicine

## 2024-05-23 ENCOUNTER — Encounter: Payer: Self-pay | Admitting: Internal Medicine

## 2024-05-23 VITALS — BP 149/87 | HR 67 | Temp 96.7°F | Resp 16 | Ht 60.0 in | Wt 164.2 lb

## 2024-05-23 DIAGNOSIS — M25562 Pain in left knee: Secondary | ICD-10-CM

## 2024-05-23 DIAGNOSIS — M05761 Rheumatoid arthritis with rheumatoid factor of right knee without organ or systems involvement: Secondary | ICD-10-CM

## 2024-05-23 DIAGNOSIS — G8929 Other chronic pain: Secondary | ICD-10-CM

## 2024-05-23 DIAGNOSIS — M05762 Rheumatoid arthritis with rheumatoid factor of left knee without organ or systems involvement: Secondary | ICD-10-CM

## 2024-05-23 DIAGNOSIS — Z79899 Other long term (current) drug therapy: Secondary | ICD-10-CM

## 2024-05-23 DIAGNOSIS — M25561 Pain in right knee: Secondary | ICD-10-CM

## 2024-05-23 MED ORDER — CELECOXIB 100 MG PO CAPS
100.0000 mg | ORAL_CAPSULE | Freq: Every day | ORAL | 0 refills | Status: AC | PRN
  Filled 2024-05-23: qty 90, 90d supply, fill #0

## 2024-05-23 NOTE — Patient Instructions (Signed)
        Supplements for Osteoarthritis  Natural anti-inflammatories can help reduce inflammation and joint stiffness without some of the harmful side effects of non-steroidal anti-inflammatories (Advil, Motrin, Aleve , etc)  Recommend starting with one supplement and give a 3-4 week trial period before adding another  You may be able to find some of these products at your local pharmacy, but may also purchase at [employer], other specialty stores, or online   Turmeric Recommended dose 400mg  once a day May increase to twice a day if tolerated (may cause stomach upset) Do not take if you are on a blood thinner, and stop prior to surgery   Ginger (root or capsules) Recommended dose is 2 grams twice daily, or 2 cups of tea daily Do not take if you are on a blood thinner, and stop prior to surgery   Fish Oil or Omega 3 Recommended dose for capsule is 2 grams twice daily (make sure it contains at least 30% of EPA/DHA) For food, two 3-ounce servings of fish a week, or flaxseed, chia seeds, walnuts, and almonds   Tart Cherry (dried, extract, or tablets) Recommended dose is 500mg  once a day   *Although these are natural products, they can still interact with medications. Always consult with your doctor or pharmacist when starting new supplements and/or medications*  *Patents should be under the care of a physician or other medical provide while taking these supplements*

## 2024-05-29 ENCOUNTER — Ambulatory Visit (INDEPENDENT_AMBULATORY_CARE_PROVIDER_SITE_OTHER): Payer: Self-pay | Admitting: Podiatry

## 2024-05-29 VITALS — Ht 60.0 in | Wt 164.0 lb

## 2024-05-29 DIAGNOSIS — L84 Corns and callosities: Secondary | ICD-10-CM

## 2024-05-29 DIAGNOSIS — B351 Tinea unguium: Secondary | ICD-10-CM

## 2024-05-29 NOTE — Progress Notes (Signed)
"  °  Subjective:  Patient ID: Vicki Cook, female    DOB: 1961/02/02,  MRN: [id number]  Chief Complaint  Patient presents with   Callouses    RM 2 Patient is here for callus right an left lateral and 4th left toe. Pt states pain on bottom left hallux toe. Pt states no injury to left hallux.    64 y.o. female presents with the above complaint. History confirmed with patient.  She returns for follow-up with worsening discoloration of the left hallux nail and the nail is getting loose.  Has painful skin lesions returning again.  Objective:  Physical Exam: warm, good capillary refill, no trophic changes or ulcerative lesions, normal DP and PT pulses, normal sensory exam, and discoloration of left hallux nail, there is hyperkeratosis on the right submetatarsal 5, left fourth toe laterally.  She has tailor's bunion on the left that is asymptomatic  Assessment:   1. Onychomycosis   2. Callus of foot       Plan:  Patient was evaluated and treated and all questions answered.  All symptomatic hyperkeratoses were safely debrided with a sterile #15 blade to patient's level of comfort without incident. We discussed preventative and palliative care of these lesions including supportive and accommodative shoegear, padding, prefabricated and custom molded accommodative orthoses, use of a pumice stone and lotions/creams daily.  Salinocaine ointment applied.  Leave on for 24 hours May utilize salicylic acid 40% at home as well.    Regarding the hallux discoloration, her previous cultures were negative for fungal growth, I recommended we repeat culture of the nail plate.  Nail sample was taken from the left hallux and sent for nail culture.  I will let her know the results of culture and we will decide treatment from there.  No follow-ups on file.   "

## 2024-05-29 NOTE — Addendum Note (Signed)
 Addended by: JASMINE VERNELL PARAS on: 05/29/2024 04:55 PM   Modules accepted: Orders

## 2024-05-31 ENCOUNTER — Other Ambulatory Visit (HOSPITAL_COMMUNITY)
Admission: RE | Admit: 2024-05-31 | Discharge: 2024-05-31 | Disposition: A | Discharge status: Home / Self Care | Payer: Self-pay | Attending: Podiatry | Admitting: Podiatry

## 2024-05-31 DIAGNOSIS — B351 Tinea unguium: Secondary | ICD-10-CM | POA: Insufficient documentation

## 2024-06-01 ENCOUNTER — Other Ambulatory Visit (HOSPITAL_COMMUNITY)
Admission: RE | Admit: 2024-06-01 | Discharge: 2024-06-01 | Disposition: A | Payer: Self-pay | Attending: Podiatry | Admitting: Podiatry

## 2024-06-01 DIAGNOSIS — B351 Tinea unguium: Secondary | ICD-10-CM | POA: Insufficient documentation

## 2024-06-04 ENCOUNTER — Other Ambulatory Visit (HOSPITAL_COMMUNITY): Payer: Self-pay

## 2024-06-04 LAB — FUNGUS CULTURE WITH STAIN

## 2024-06-04 LAB — FUNGUS CULTURE RESULT

## 2024-08-22 ENCOUNTER — Ambulatory Visit: Payer: Self-pay | Admitting: Internal Medicine
